# Patient Record
Sex: Male | Born: 1945 | Race: White | Hispanic: No | Marital: Married | Smoking: Former smoker
Health system: Southern US, Community
[De-identification: ages and names within clinical notes are randomized; demographics above are authoritative.]

## PROBLEM LIST (undated history)

## (undated) DIAGNOSIS — E785 Hyperlipidemia, unspecified: Secondary | ICD-10-CM

## (undated) DIAGNOSIS — D649 Anemia, unspecified: Secondary | ICD-10-CM

## (undated) DIAGNOSIS — Z87442 Personal history of urinary calculi: Secondary | ICD-10-CM

## (undated) DIAGNOSIS — Z974 Presence of external hearing-aid: Secondary | ICD-10-CM

## (undated) DIAGNOSIS — J439 Emphysema, unspecified: Secondary | ICD-10-CM

## (undated) DIAGNOSIS — M109 Gout, unspecified: Secondary | ICD-10-CM

## (undated) DIAGNOSIS — L570 Actinic keratosis: Secondary | ICD-10-CM

## (undated) DIAGNOSIS — I10 Essential (primary) hypertension: Secondary | ICD-10-CM

## (undated) DIAGNOSIS — Z973 Presence of spectacles and contact lenses: Secondary | ICD-10-CM

## (undated) DIAGNOSIS — E119 Type 2 diabetes mellitus without complications: Secondary | ICD-10-CM

## (undated) DIAGNOSIS — G4733 Obstructive sleep apnea (adult) (pediatric): Secondary | ICD-10-CM

## (undated) DIAGNOSIS — I890 Lymphedema, not elsewhere classified: Secondary | ICD-10-CM

## (undated) DIAGNOSIS — N433 Hydrocele, unspecified: Secondary | ICD-10-CM

## (undated) DIAGNOSIS — N3941 Urge incontinence: Secondary | ICD-10-CM

## (undated) DIAGNOSIS — S62101A Fracture of unspecified carpal bone, right wrist, initial encounter for closed fracture: Secondary | ICD-10-CM

## (undated) DIAGNOSIS — R011 Cardiac murmur, unspecified: Secondary | ICD-10-CM

## (undated) DIAGNOSIS — E039 Hypothyroidism, unspecified: Secondary | ICD-10-CM

## (undated) DIAGNOSIS — Z9989 Dependence on other enabling machines and devices: Secondary | ICD-10-CM

## (undated) DIAGNOSIS — C801 Malignant (primary) neoplasm, unspecified: Secondary | ICD-10-CM

## (undated) DIAGNOSIS — Z8546 Personal history of malignant neoplasm of prostate: Secondary | ICD-10-CM

## (undated) HISTORY — PX: OTHER SURGICAL HISTORY: SHX169

## (undated) HISTORY — PX: JOINT REPLACEMENT: SHX530

## (undated) HISTORY — PX: TONSILLECTOMY: SUR1361

## (undated) HISTORY — DX: Actinic keratosis: L57.0

---

## 2004-06-05 ENCOUNTER — Ambulatory Visit: Payer: Self-pay

## 2005-09-01 ENCOUNTER — Emergency Department: Payer: Self-pay | Admitting: Emergency Medicine

## 2005-09-02 ENCOUNTER — Other Ambulatory Visit: Payer: Self-pay

## 2005-09-03 ENCOUNTER — Ambulatory Visit: Payer: Self-pay | Admitting: Urology

## 2005-09-25 ENCOUNTER — Ambulatory Visit: Payer: Self-pay | Admitting: Urology

## 2005-10-15 ENCOUNTER — Ambulatory Visit: Payer: Self-pay | Admitting: Gastroenterology

## 2005-11-02 ENCOUNTER — Ambulatory Visit: Payer: Self-pay | Admitting: Radiation Oncology

## 2006-07-26 ENCOUNTER — Ambulatory Visit: Payer: Self-pay

## 2006-08-06 ENCOUNTER — Ambulatory Visit: Payer: Self-pay | Admitting: Unknown Physician Specialty

## 2006-08-06 ENCOUNTER — Other Ambulatory Visit: Payer: Self-pay

## 2006-08-13 ENCOUNTER — Ambulatory Visit: Payer: Self-pay | Admitting: Unknown Physician Specialty

## 2007-10-06 IMAGING — CT CT ABD-PELV W/O CM
1 of 2 series · 16 of 32 positions shown, 20 images · non-contrast
Comparison: none

REASON FOR EXAM: Abdominal pain. Stone protocol. Rm 3
COMMENTS:

[Series 2: stone · axial · 0.93mm/px · z∈[-290,+151]mm · 16 of 159 slices shown, 20 images]
[im 6/159  soft-tissue]
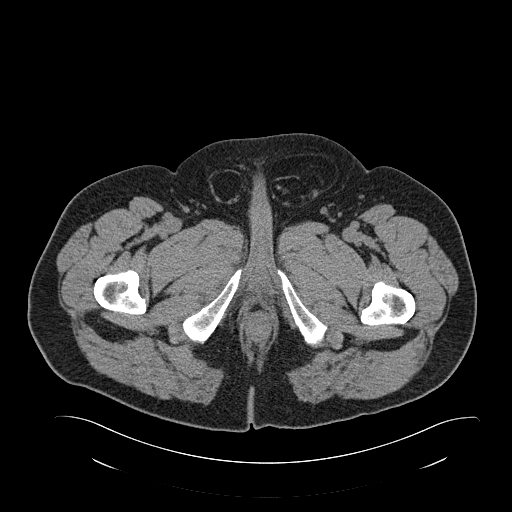
[im 6/159  bone]
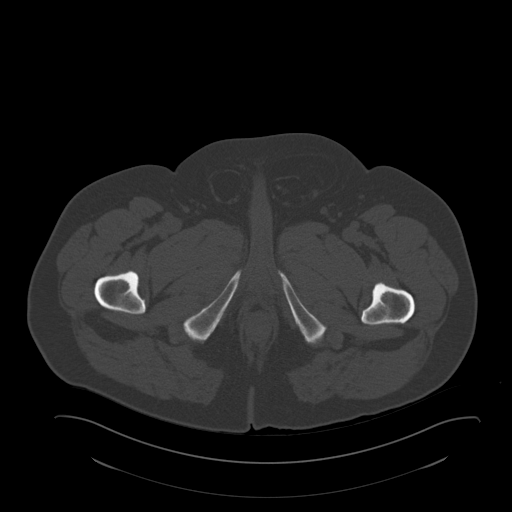
[im 17/159  soft-tissue]
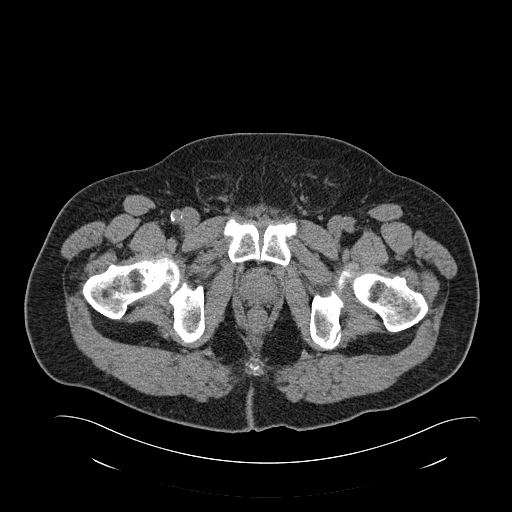
[im 29/159  soft-tissue]
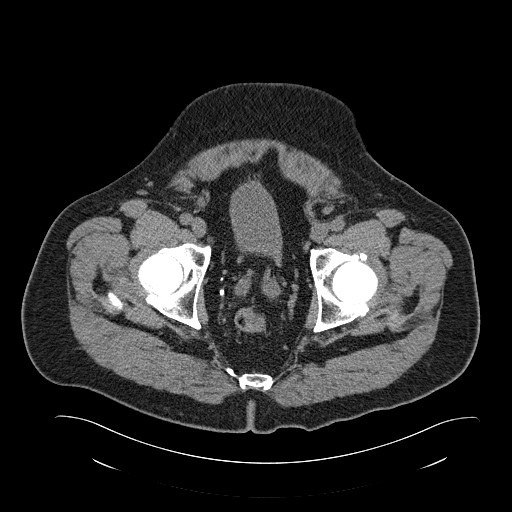
[im 40/159  soft-tissue]
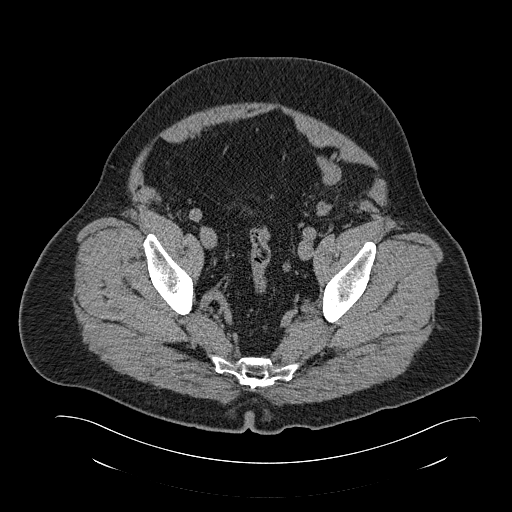
[im 51/159  soft-tissue]
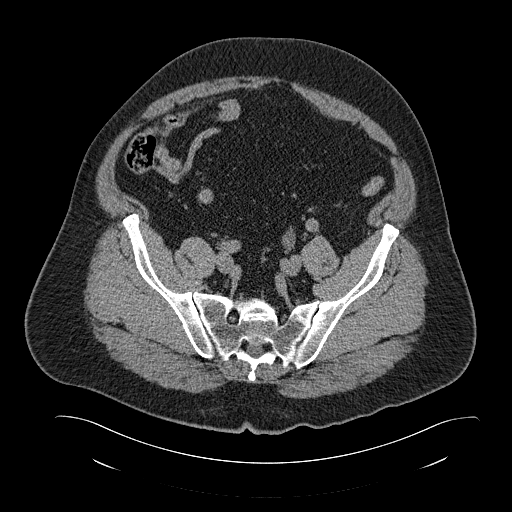
[im 63/159  soft-tissue]
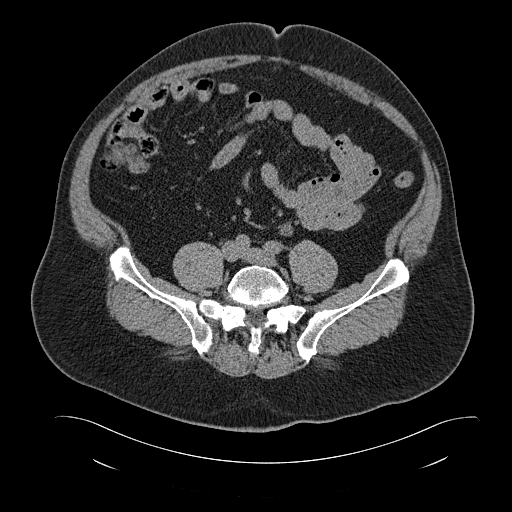
[im 74/159  soft-tissue]
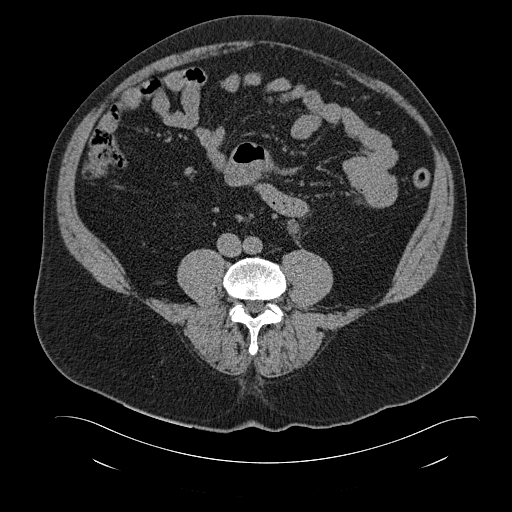
[im 85/159  soft-tissue]
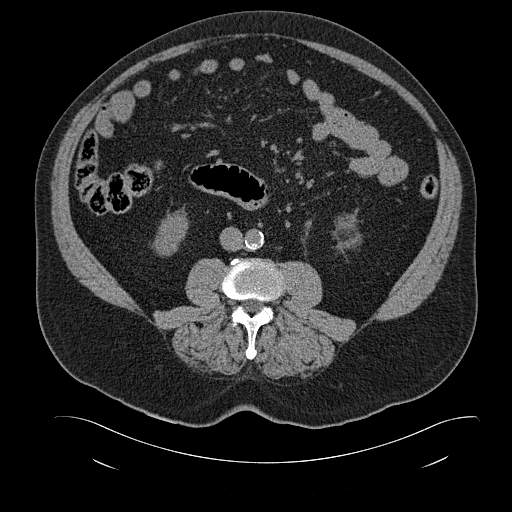
[im 96/159  soft-tissue]
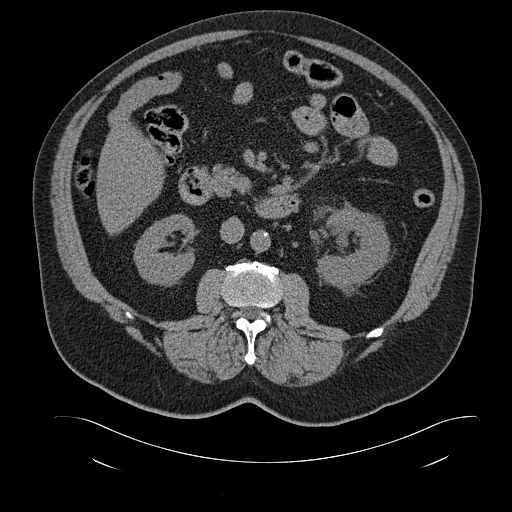
[im 96/159  bone]
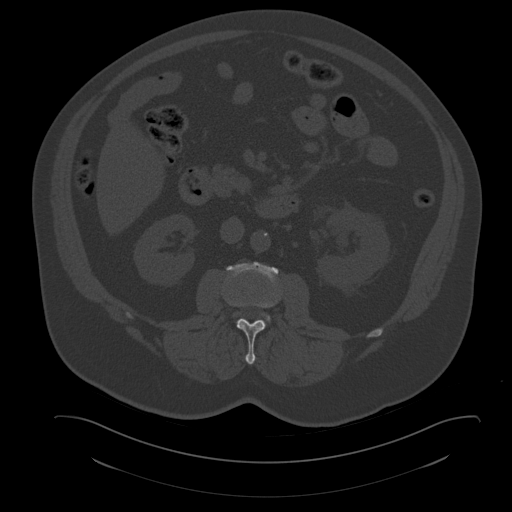
[im 108/159  soft-tissue]
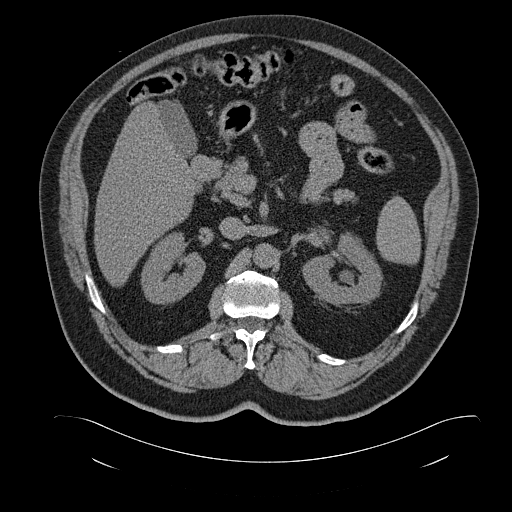
[im 119/159  soft-tissue]
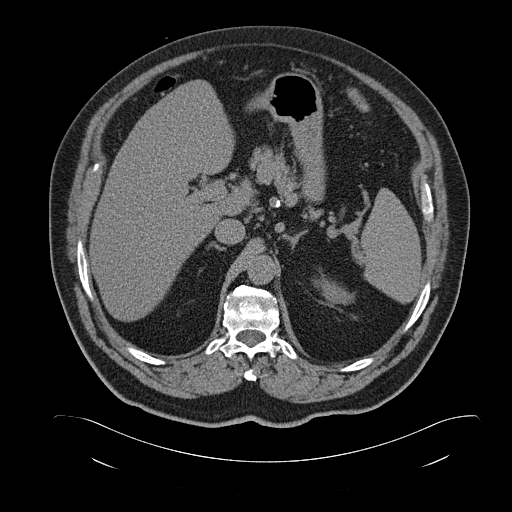
[im 130/159  soft-tissue]
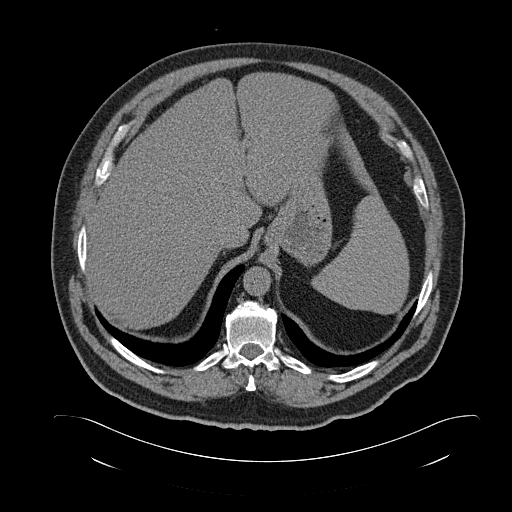
[im 136/159  lung]
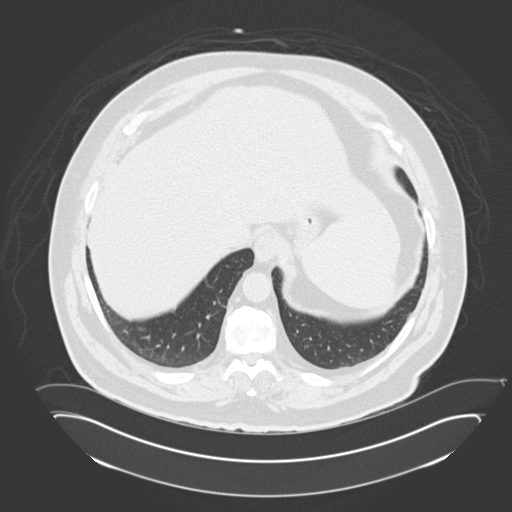
[im 142/159  soft-tissue]
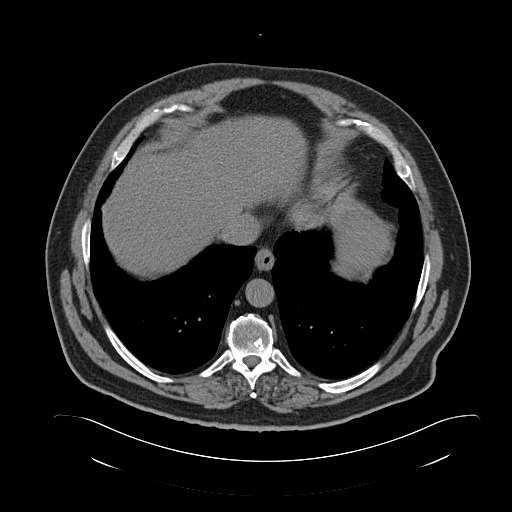
[im 142/159  lung]
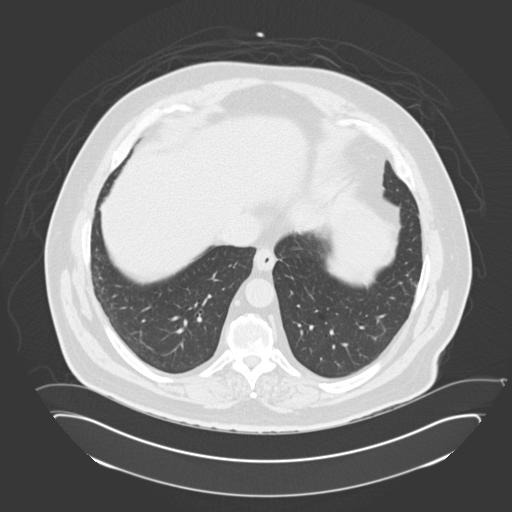
[im 147/159  lung]
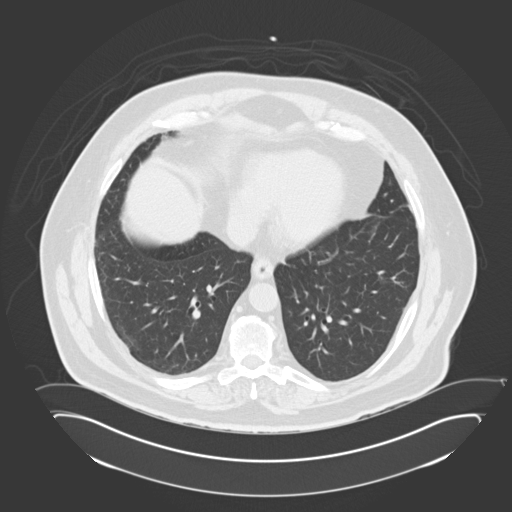
[im 153/159  soft-tissue]
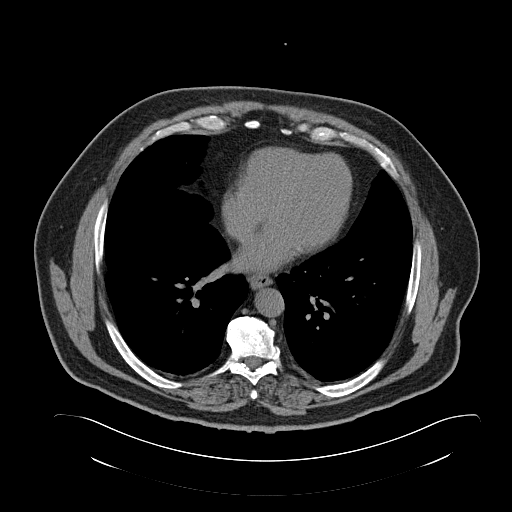
[im 153/159  lung]
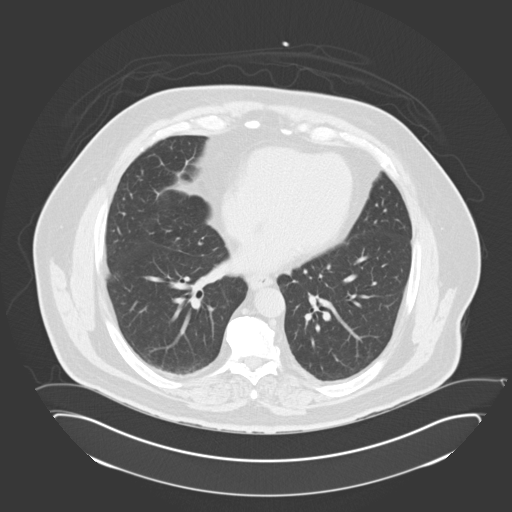

[16 of 32 positions shown; findings below may reference images not displayed]

PROCEDURE:     CT  - CT ABDOMEN AND PELVIS W[DATE]  [DATE]

RESULT:     Noncontrast emergent CT scan of the abdomen and pelvis is
performed.  The patient has no prior study available for comparison.  The
examination demonstrates moderate LEFT-sided hydronephrosis and hydroureter
down to a 4-mm distal LEFT ureteral stone proximal to the ureterovesical
junction (image 126).  No additional stones are seen.  No free fluid or free
air is evident.  There is perinephric stranding on the LEFT.  Some
calcification is noted in the prostate.  Some phleboliths are present in the
pelvic region.  No abnormal bowel distention is present.  The appendix is
normal in appearance.  No radiopaque gallstones are evident.
IMPRESSION: Moderate LEFT-sided hydronephrosis and hydroureter down to a distal LEFT
ureteral stone of approximately 4 mm.

## 2007-10-08 IMAGING — CR DG ABDOMEN 1V
1 series · 2 of 2 positions shown · non-contrast
Comparison: none

REASON FOR EXAM: Renal calculi, lithotripsy
COMMENTS:

[Series 1: view not recorded · 0.17mm/px · 2 of 2 slices shown]
[im 1/2]
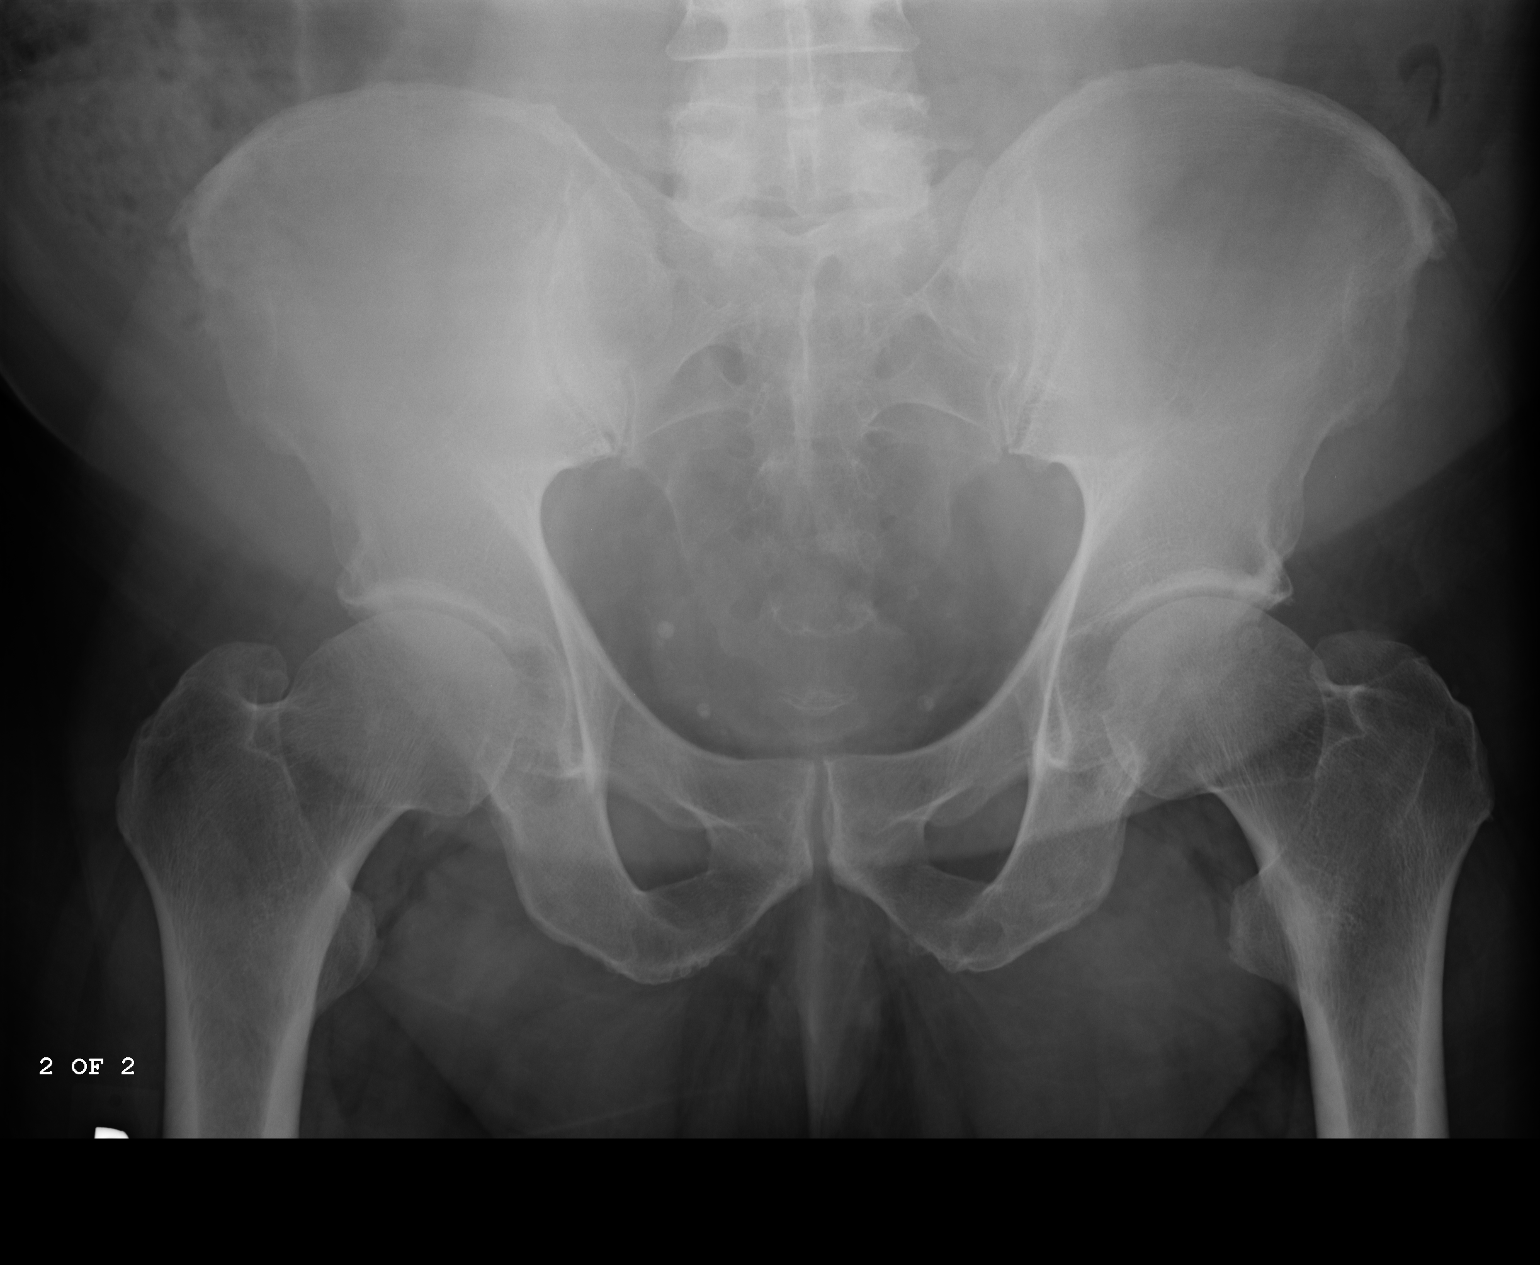
[im 2/2]
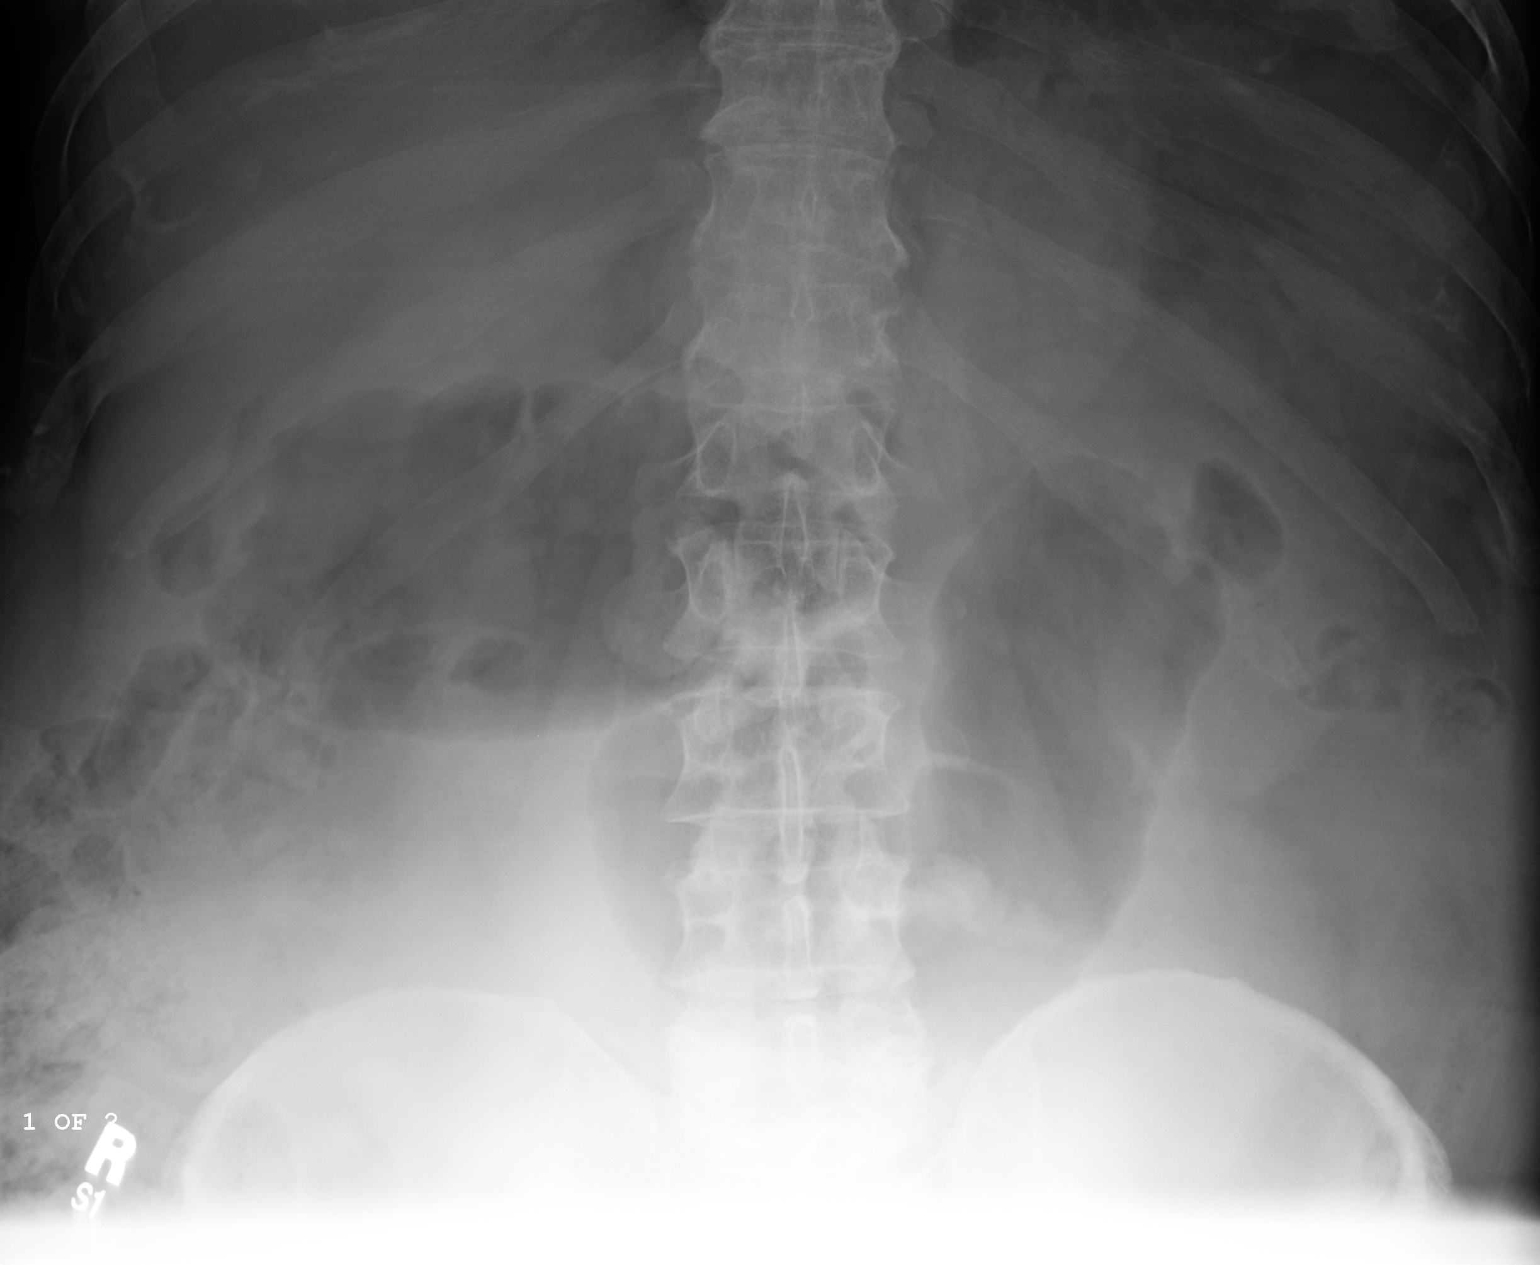

[2 of 2 positions shown; findings below may reference images not displayed]

PROCEDURE:     DXR - DXR KIDNEY URETER BLADDER  - September 03, 2005  [DATE]

RESULT:     Air is seen within nondilated loops of large and small bowel.
There does not appear to be evidence of calcified densities projecting in
the region of the RIGHT and LEFT renal fossa. An amorphous area of density
projects in the region of the transverse colon. This is large and likely
represents a region of stool.

Evaluation of the pelvis demonstrates phleboliths within the pelvis. The
smaller areas particularly in the periphery and the RIGHT lower pelvis may
represent the sequelae of distal ureteral calculi.
IMPRESSION: 1.     Possible distal ureteral calculi on the RIGHT as described above.
2.     Non-obstructive bowel gas pattern.

## 2008-05-11 HISTORY — PX: SHOULDER OPEN ROTATOR CUFF REPAIR: SHX2407

## 2009-06-20 ENCOUNTER — Ambulatory Visit: Payer: Self-pay | Admitting: Gastroenterology

## 2010-05-11 HISTORY — PX: PARTIAL KNEE ARTHROPLASTY: SHX2174

## 2011-05-21 ENCOUNTER — Ambulatory Visit: Payer: Self-pay | Admitting: Unknown Physician Specialty

## 2011-06-04 ENCOUNTER — Ambulatory Visit: Payer: Self-pay | Admitting: Unknown Physician Specialty

## 2012-05-09 ENCOUNTER — Emergency Department: Payer: Self-pay | Admitting: Emergency Medicine

## 2012-05-09 LAB — URINALYSIS, COMPLETE
Bacteria: NONE SEEN
Bilirubin,UR: NEGATIVE
Blood: NEGATIVE
Glucose,UR: >=500 mg/dL
Leukocyte Esterase: NEGATIVE
Nitrite: NEGATIVE
Ph: 5
Protein: NEGATIVE
RBC,UR: 1 /HPF
Specific Gravity: 1.028
Squamous Epithelial: NONE SEEN
WBC UR: 1 /HPF

## 2012-05-09 LAB — COMPREHENSIVE METABOLIC PANEL
Albumin: 3.7 g/dL (ref 3.4–5.0)
Alkaline Phosphatase: 119 U/L (ref 50–136)
BUN: 31 mg/dL — ABNORMAL HIGH (ref 7–18)
Bilirubin,Total: 0.4 mg/dL (ref 0.2–1.0)
Creatinine: 1.13 mg/dL (ref 0.60–1.30)
Glucose: 235 mg/dL — ABNORMAL HIGH (ref 65–99)
Osmolality: 286 (ref 275–301)
Sodium: 136 mmol/L (ref 136–145)
Total Protein: 6.5 g/dL (ref 6.4–8.2)

## 2012-05-09 LAB — CBC
HCT: 47.5 %
HGB: 16.2 g/dL
MCH: 29.7 pg
MCHC: 34.2 g/dL
MCV: 87 fL
Platelet: 156 x10 3/mm 3
RBC: 5.47 x10 6/mm 3
RDW: 14.9 % — ABNORMAL HIGH
WBC: 5.4 x10 3/mm 3

## 2012-05-09 LAB — AMYLASE: Amylase: 30 U/L

## 2012-05-09 LAB — LIPASE, BLOOD: Lipase: 112 U/L

## 2012-05-09 LAB — TROPONIN I: Troponin-I: <0.02 ng/mL

## 2012-08-15 DIAGNOSIS — C61 Malignant neoplasm of prostate: Secondary | ICD-10-CM | POA: Insufficient documentation

## 2013-08-04 ENCOUNTER — Ambulatory Visit: Payer: Self-pay | Admitting: Gastroenterology

## 2013-08-08 LAB — PATHOLOGY REPORT

## 2014-05-31 DIAGNOSIS — G4733 Obstructive sleep apnea (adult) (pediatric): Secondary | ICD-10-CM

## 2014-05-31 DIAGNOSIS — M109 Gout, unspecified: Secondary | ICD-10-CM | POA: Insufficient documentation

## 2014-10-09 DIAGNOSIS — G8929 Other chronic pain: Secondary | ICD-10-CM | POA: Insufficient documentation

## 2015-01-29 ENCOUNTER — Other Ambulatory Visit: Payer: Self-pay | Admitting: Urology

## 2015-02-02 ENCOUNTER — Encounter: Payer: Self-pay | Admitting: Emergency Medicine

## 2015-02-02 ENCOUNTER — Emergency Department
Admission: EM | Admit: 2015-02-02 | Discharge: 2015-02-02 | Disposition: A | Discharge status: Home / Self Care | Payer: Medicare Other | Attending: Emergency Medicine | Admitting: Emergency Medicine

## 2015-02-02 ENCOUNTER — Emergency Department: Payer: Medicare Other

## 2015-02-02 DIAGNOSIS — S62141A Displaced fracture of body of hamate [unciform] bone, right wrist, initial encounter for closed fracture: Secondary | ICD-10-CM | POA: Insufficient documentation

## 2015-02-02 DIAGNOSIS — Z87891 Personal history of nicotine dependence: Secondary | ICD-10-CM | POA: Diagnosis not present

## 2015-02-02 DIAGNOSIS — Y9389 Activity, other specified: Secondary | ICD-10-CM | POA: Insufficient documentation

## 2015-02-02 DIAGNOSIS — W1839XA Other fall on same level, initial encounter: Secondary | ICD-10-CM | POA: Diagnosis not present

## 2015-02-02 DIAGNOSIS — Y998 Other external cause status: Secondary | ICD-10-CM | POA: Diagnosis not present

## 2015-02-02 DIAGNOSIS — S62101A Fracture of unspecified carpal bone, right wrist, initial encounter for closed fracture: Secondary | ICD-10-CM

## 2015-02-02 DIAGNOSIS — Y9289 Other specified places as the place of occurrence of the external cause: Secondary | ICD-10-CM | POA: Insufficient documentation

## 2015-02-02 DIAGNOSIS — Z880 Allergy status to penicillin: Secondary | ICD-10-CM | POA: Insufficient documentation

## 2015-02-02 DIAGNOSIS — S6991XA Unspecified injury of right wrist, hand and finger(s), initial encounter: Secondary | ICD-10-CM | POA: Diagnosis present

## 2015-02-02 DIAGNOSIS — E119 Type 2 diabetes mellitus without complications: Secondary | ICD-10-CM | POA: Insufficient documentation

## 2015-02-02 DIAGNOSIS — I10 Essential (primary) hypertension: Secondary | ICD-10-CM | POA: Insufficient documentation

## 2015-02-02 HISTORY — DX: Essential (primary) hypertension: I10

## 2015-02-02 MED ORDER — HYDROCODONE-ACETAMINOPHEN 5-325 MG PO TABS
ORAL_TABLET | ORAL | Status: DC
Start: 2015-02-02 — End: 2018-10-25

## 2015-02-02 NOTE — ED Notes (Signed)
Patient with no complaints at this time. Respirations even and unlabored. Skin warm/dry. Discharge instructions reviewed with patient at this time. Patient given opportunity to voice concerns/ask questions. Patient discharged at this time and left Emergency Department with steady gait, accompanied by significant other.

## 2015-02-02 NOTE — ED Notes (Signed)
Pt reports falling off the curb at [place] today around 130 hours. He states that he thinks he missed the curb; denies loc/soubts that he hit his head. Pt has bruising on left arm. Pt alert & oriented with warm, dry skin. NAD noted.

## 2015-02-02 NOTE — ED Notes (Signed)
States slipped off curb and fell leaving store, pain both wrists.

## 2015-02-02 NOTE — ED Provider Notes (Signed)
[facility] Emergency Department Provider Note  ____________________________________________  Time seen: Approximately 6:55 PM  I have reviewed the triage vital signs and the nursing notes.   HISTORY  Chief Complaint Wrist Pain   HPI Christopher Pruitt is a 69 y.o. male is here with complaint of right wrist pain. He states that he fell off the curb at [place] today around 1:30 PM. He states that he thinks that he stepped down and missed. He denies any loss of consciousness or head injury. Denies any neck pain. He denies any previous injury to his right wrist. He is not taking any over-the-counter medication for this. He rates his pain is 5/10. Pain is constant and nonradiating. He denies any paresthesias distal to his injury. Range of motion or exertion of his wrist increases his pain.   Past Medical History  Diagnosis Date  . Diabetes mellitus without complication   . Hypertension   . Thyroid disease     There are no active problems to display for this patient.   Past Surgical History  Procedure Laterality Date  . Rotater cuff surgery      Current Outpatient Rx  Name  Route  Sig  Dispense  Refill  . HYDROcodone-acetaminophen (NORCO/VICODIN) 5-325 MG per tablet      Take 1 tablet q 4-6 hours prn pain   30 tablet   0     Allergies Amoxicillin-pot clavulanate; Nalfon; and Robaxin  No family history on file.  Social History Social History  Substance Use Topics  . Smoking status: Former Research scientist (life sciences)  . Smokeless tobacco: None  . Alcohol Use: No    Review of Systems Constitutional: No fever/chills Eyes: No visual changes. Cardiovascular: Denies chest pain. Respiratory: Denies shortness of breath. Gastrointestinal:  No nausea, no vomiting. Musculoskeletal: Negative for back pain. Positive for right wrist pain Skin: Negative for rash. Neurological: Negative for headaches, focal weakness or numbness.  10-point ROS otherwise  negative.  ____________________________________________   PHYSICAL EXAM:  VITAL SIGNS: ED Triage Vitals  Enc Vitals Group     BP 02/02/15 1745 154/71 mmHg     Pulse Rate 02/02/15 1745 99     Resp 02/02/15 1745 18     Temp 02/02/15 1745 98.2 F (36.8 C)     Temp Source 02/02/15 1745 Oral     SpO2 02/02/15 1745 95 %     Weight 02/02/15 1745 245 lb (111.131 kg)     Height 02/02/15 1745 5\' 8"  (1.727 m)     Head Cir --      Peak Flow --      Pain Score 02/02/15 1746 5     Pain Loc --      Pain Edu? --      Excl. in [place]? --     Constitutional: Alert and oriented. Well appearing and in no acute distress. Eyes: Conjunctivae are normal. PERRL. EOMI. Head: Atraumatic. Nose: No congestion/rhinnorhea. Neck: No stridor.  No cervical tenderness on palpation of C-spine Cardiovascular: Normal rate, regular rhythm. Grossly normal heart sounds.  Good peripheral circulation. Respiratory: Normal respiratory effort.  No retractions. Lungs CTAB. Gastrointestinal: Soft and nontender. No distention. Musculoskeletal: Right wrist no gross deformity however there is some soft tissue edema present dorsal aspect. There is moderate tenderness on palpation dorsal aspect middle of the wrist between on an radius. Range of motion is restricted secondary to pain. Digits distally range of motion within normal limits. Left wrist no gross deformity range of motion is within  normal limits nontender to palpation. There is no soft tissue edema noted.  No lower extremity tenderness nor edema.  No joint effusions. Neurologic:  Normal speech and language. No gross focal neurologic deficits are appreciated. No gait instability. Skin:  Skin is warm, dry and intact. No rash noted.There is some bruising on the arm above but no tenderness on palpation.  Psychiatric: Mood and affect are normal. Speech and behavior are normal.  ____________________________________________   LABS (all labs ordered are listed, but only  abnormal results are displayed)  Labs Reviewed - No data to display RADIOLOGY  Right wrist x-ray per radiologist shows acute mild grossly displaced fracture of the hamate . Left wrist x-ray shows mild degenerative changes but no acute bony abnormalities.  I, Johnn Hai, personally viewed and evaluated these images (plain radiographs) as part of my medical decision making.  ____________________________________________   PROCEDURES  Procedure(s) performed: None  Critical Care performed: No  ____________________________________________   INITIAL IMPRESSION / ASSESSMENT AND PLAN / ED COURSE  Pertinent labs & imaging results that were available during my care of the patient were reviewed by me and considered in my medical decision making (see chart for details).  OCL splint was ordered for the patient however patient refused this splint due to it being too constrictive. Patient states that he has "3 banana pudding to make". Wife states that she is going to make them the patient is disgruntled. Velcro cockup splint was agreeable for the patient. He was told that immobilization would help his wrist and control his pain much better. He is also told to keep ice and elevate as needed for swelling. He is given a prescription for Norco as needed for pain. He will follow-up with Dr.Menz. He will call the office on Monday for an appointment time. ____________________________________________   FINAL CLINICAL IMPRESSION(S) / ED DIAGNOSES  Final diagnoses:  Fracture of wrist, right, closed, initial encounter      Johnn Hai, PA-C 02/02/15 1920  Lavonia Drafts, MD 02/02/15 2351

## 2015-02-04 ENCOUNTER — Encounter (HOSPITAL_BASED_OUTPATIENT_CLINIC_OR_DEPARTMENT_OTHER): Payer: Self-pay | Admitting: *Deleted

## 2015-02-04 NOTE — Progress Notes (Signed)
NPO AFTER MN.  ARRIVE AT 0715.  NEEDS ISTAT AND EKG.   WILL SYNTHROID, ALLPURINOL, AND DITROPAN AM DOS W/ SIPS OF WATER.

## 2015-02-10 NOTE — Anesthesia Preprocedure Evaluation (Addendum)
Anesthesia Evaluation  Patient identified by MRN, date of birth, ID band Patient awake    Reviewed: Allergy & Precautions, NPO status , Patient's Chart, lab work & pertinent test results  History of Anesthesia Complications Negative for: history of anesthetic complications  Airway Mallampati: III  TM Distance: >3 FB Neck ROM: Full    Dental no notable dental hx. (+) Dental Advisory Given, Teeth Intact   Pulmonary sleep apnea , COPD,  COPD inhaler, former smoker,  4/16  SPIROMETRY: FVC was 4.27 liters, 126% of predicted FEV1 was 3.21, 120% of predicted FEV1 ratio was 75 FEF 25-75% liters per second was 94% of predicted  FLOW VOLUME LOOP: Normal   Impression Normal   *Compared to Previous Study, numbers are slightly improved.    Pulmonary exam normal breath sounds clear to auscultation       Cardiovascular hypertension, Pt. on medications Normal cardiovascular exam Rhythm:Regular Rate:Normal     Neuro/Psych negative neurological ROS  negative psych ROS   GI/Hepatic negative GI ROS, Neg liver ROS,   Endo/Other  diabetesHypothyroidism obesity  Renal/GU negative Renal ROS  negative genitourinary   Musculoskeletal negative musculoskeletal ROS (+)   Abdominal (+) + obese,   Peds negative pediatric ROS (+)  Hematology negative hematology ROS (+)   Anesthesia Other Findings   Reproductive/Obstetrics negative OB ROS                        Anesthesia Physical Anesthesia Plan  ASA: III  Anesthesia Plan: General   Post-op Pain Management:    Induction: Intravenous  Airway Management Planned: LMA  Additional Equipment:   Intra-op Plan:   Post-operative Plan: Extubation in OR  Informed Consent: I have reviewed the patients History and Physical, chart, labs and discussed the procedure including the risks, benefits and alternatives for the proposed anesthesia with the patient or  authorized representative who has indicated his/her understanding and acceptance.   Dental advisory given  Plan Discussed with: CRNA and Anesthesiologist  Anesthesia Plan Comments:       Anesthesia Quick Evaluation

## 2015-02-11 ENCOUNTER — Encounter (HOSPITAL_BASED_OUTPATIENT_CLINIC_OR_DEPARTMENT_OTHER)
Admission: RE | Disposition: A | Discharge status: Home / Self Care | Payer: Self-pay | Source: Ambulatory Visit | Attending: Urology

## 2015-02-11 ENCOUNTER — Ambulatory Visit (HOSPITAL_BASED_OUTPATIENT_CLINIC_OR_DEPARTMENT_OTHER)
Admission: RE | Admit: 2015-02-11 | Discharge: 2015-02-11 | Disposition: A | Discharge status: Home / Self Care | Payer: Medicare Other | Source: Ambulatory Visit | Attending: Urology | Admitting: Urology

## 2015-02-11 ENCOUNTER — Ambulatory Visit (HOSPITAL_BASED_OUTPATIENT_CLINIC_OR_DEPARTMENT_OTHER): Payer: Medicare Other | Admitting: Anesthesiology

## 2015-02-11 ENCOUNTER — Encounter (HOSPITAL_BASED_OUTPATIENT_CLINIC_OR_DEPARTMENT_OTHER): Payer: Self-pay

## 2015-02-11 DIAGNOSIS — Z923 Personal history of irradiation: Secondary | ICD-10-CM | POA: Diagnosis not present

## 2015-02-11 DIAGNOSIS — E669 Obesity, unspecified: Secondary | ICD-10-CM | POA: Insufficient documentation

## 2015-02-11 DIAGNOSIS — Z79899 Other long term (current) drug therapy: Secondary | ICD-10-CM | POA: Diagnosis not present

## 2015-02-11 DIAGNOSIS — G4730 Sleep apnea, unspecified: Secondary | ICD-10-CM | POA: Diagnosis not present

## 2015-02-11 DIAGNOSIS — J449 Chronic obstructive pulmonary disease, unspecified: Secondary | ICD-10-CM | POA: Diagnosis not present

## 2015-02-11 DIAGNOSIS — Z87891 Personal history of nicotine dependence: Secondary | ICD-10-CM | POA: Insufficient documentation

## 2015-02-11 DIAGNOSIS — Z8546 Personal history of malignant neoplasm of prostate: Secondary | ICD-10-CM | POA: Insufficient documentation

## 2015-02-11 DIAGNOSIS — I10 Essential (primary) hypertension: Secondary | ICD-10-CM | POA: Insufficient documentation

## 2015-02-11 DIAGNOSIS — E119 Type 2 diabetes mellitus without complications: Secondary | ICD-10-CM | POA: Insufficient documentation

## 2015-02-11 DIAGNOSIS — Z6837 Body mass index (BMI) 37.0-37.9, adult: Secondary | ICD-10-CM | POA: Insufficient documentation

## 2015-02-11 DIAGNOSIS — Z96659 Presence of unspecified artificial knee joint: Secondary | ICD-10-CM | POA: Diagnosis not present

## 2015-02-11 DIAGNOSIS — Z7982 Long term (current) use of aspirin: Secondary | ICD-10-CM | POA: Diagnosis not present

## 2015-02-11 DIAGNOSIS — E7800 Pure hypercholesterolemia, unspecified: Secondary | ICD-10-CM | POA: Insufficient documentation

## 2015-02-11 DIAGNOSIS — M109 Gout, unspecified: Secondary | ICD-10-CM | POA: Insufficient documentation

## 2015-02-11 DIAGNOSIS — N433 Hydrocele, unspecified: Secondary | ICD-10-CM | POA: Diagnosis not present

## 2015-02-11 HISTORY — DX: Presence of external hearing-aid: Z97.4

## 2015-02-11 HISTORY — DX: Type 2 diabetes mellitus without complications: E11.9

## 2015-02-11 HISTORY — DX: Personal history of malignant neoplasm of prostate: Z85.46

## 2015-02-11 HISTORY — DX: Lymphedema, not elsewhere classified: I89.0

## 2015-02-11 HISTORY — DX: Urge incontinence: N39.41

## 2015-02-11 HISTORY — PX: HYDROCELE EXCISION: SHX482

## 2015-02-11 HISTORY — DX: Gout, unspecified: M10.9

## 2015-02-11 HISTORY — DX: Fracture of unspecified carpal bone, right wrist, initial encounter for closed fracture: S62.101A

## 2015-02-11 HISTORY — DX: Hypothyroidism, unspecified: E03.9

## 2015-02-11 HISTORY — DX: Presence of spectacles and contact lenses: Z97.3

## 2015-02-11 HISTORY — DX: Hydrocele, unspecified: N43.3

## 2015-02-11 HISTORY — DX: Obstructive sleep apnea (adult) (pediatric): G47.33

## 2015-02-11 HISTORY — DX: Emphysema, unspecified: J43.9

## 2015-02-11 HISTORY — DX: Dependence on other enabling machines and devices: Z99.89

## 2015-02-11 LAB — GLUCOSE, CAPILLARY: Glucose-Capillary: 185 mg/dL — ABNORMAL HIGH (ref 65–99)

## 2015-02-11 LAB — POCT I-STAT 4, (NA,K, GLUC, HGB,HCT)
GLUCOSE: 127 mg/dL — AB (ref 65–99)
HCT: 37 % — ABNORMAL LOW (ref 39.0–52.0)
Hemoglobin: 12.6 g/dL — ABNORMAL LOW (ref 13.0–17.0)
POTASSIUM: 4.4 mmol/L (ref 3.5–5.1)
Sodium: 138 mmol/L (ref 135–145)

## 2015-02-11 SURGERY — HYDROCELECTOMY
Anesthesia: General | Site: Scrotum | Laterality: Left

## 2015-02-11 MED ORDER — MIDAZOLAM HCL 5 MG/5ML IJ SOLN
INTRAMUSCULAR | Status: DC | PRN
  Administered 2015-02-11: 1 mg via INTRAVENOUS

## 2015-02-11 MED ORDER — HYDROCODONE-ACETAMINOPHEN 5-325 MG PO TABS
1.0000 | ORAL_TABLET | ORAL | Status: DC | PRN
  Administered 2015-02-11: 1 via ORAL
  Filled 2015-02-11: qty 2

## 2015-02-11 MED ORDER — FENTANYL CITRATE (PF) 100 MCG/2ML IJ SOLN
INTRAMUSCULAR | Status: DC | PRN
  Administered 2015-02-11: 25 ug via INTRAVENOUS
  Administered 2015-02-11: 50 ug via INTRAVENOUS
  Administered 2015-02-11: 25 ug via INTRAVENOUS

## 2015-02-11 MED ORDER — PROPOFOL 10 MG/ML IV BOLUS
INTRAVENOUS | Status: DC | PRN
  Administered 2015-02-11 (×4): 20 mg via INTRAVENOUS
  Administered 2015-02-11: 200 mg via INTRAVENOUS

## 2015-02-11 MED ORDER — CEFAZOLIN SODIUM 1-5 GM-% IV SOLN
1.0000 g | INTRAVENOUS | Status: DC
  Filled 2015-02-11: qty 50

## 2015-02-11 MED ORDER — CLINDAMYCIN PHOSPHATE 900 MG/50ML IV SOLN
INTRAVENOUS | Status: AC
  Filled 2015-02-11: qty 50

## 2015-02-11 MED ORDER — CEFAZOLIN SODIUM-DEXTROSE 2-3 GM-% IV SOLR
INTRAVENOUS | Status: AC
  Filled 2015-02-11: qty 50

## 2015-02-11 MED ORDER — LIDOCAINE HCL (CARDIAC) 20 MG/ML IV SOLN
INTRAVENOUS | Status: DC | PRN
  Administered 2015-02-11: 80 mg via INTRAVENOUS

## 2015-02-11 MED ORDER — HYDROCODONE-ACETAMINOPHEN 5-325 MG PO TABS
ORAL_TABLET | ORAL | Status: AC
  Filled 2015-02-11: qty 1

## 2015-02-11 MED ORDER — ONDANSETRON HCL 4 MG/2ML IJ SOLN
INTRAMUSCULAR | Status: DC | PRN
  Administered 2015-02-11 (×2): 4 mg via INTRAVENOUS

## 2015-02-11 MED ORDER — CLINDAMYCIN PHOSPHATE 900 MG/50ML IV SOLN
900.0000 mg | Freq: Once | INTRAVENOUS | Status: AC
  Administered 2015-02-11: 900 mg via INTRAVENOUS
  Filled 2015-02-11: qty 50

## 2015-02-11 MED ORDER — PHENYLEPHRINE HCL 10 MG/ML IJ SOLN
INTRAMUSCULAR | Status: DC | PRN
  Administered 2015-02-11 (×5): 120 ug via INTRAVENOUS

## 2015-02-11 MED ORDER — CLINDAMYCIN HCL 300 MG PO CAPS: 600.0000 mg | ORAL_CAPSULE | Freq: Three times a day (TID) | ORAL | Status: DC

## 2015-02-11 MED ORDER — FENTANYL CITRATE (PF) 100 MCG/2ML IJ SOLN
INTRAMUSCULAR | Status: AC
  Filled 2015-02-11: qty 2

## 2015-02-11 MED ORDER — LACTATED RINGERS IV SOLN
INTRAVENOUS | Status: DC
  Administered 2015-02-11 (×2): via INTRAVENOUS
  Filled 2015-02-11: qty 1000

## 2015-02-11 MED ORDER — ONDANSETRON HCL 4 MG/2ML IJ SOLN
4.0000 mg | Freq: Once | INTRAMUSCULAR | Status: DC | PRN
  Filled 2015-02-11: qty 2

## 2015-02-11 MED ORDER — HYDROCODONE-ACETAMINOPHEN 5-325 MG PO TABS: 1.0000 | ORAL_TABLET | ORAL | Status: DC | PRN

## 2015-02-11 MED ORDER — CEFAZOLIN SODIUM-DEXTROSE 2-3 GM-% IV SOLR
2.0000 g | INTRAVENOUS | Status: DC
  Filled 2015-02-11: qty 50

## 2015-02-11 MED ORDER — MIDAZOLAM HCL 2 MG/2ML IJ SOLN
INTRAMUSCULAR | Status: AC
  Filled 2015-02-11: qty 2

## 2015-02-11 MED ORDER — DEXAMETHASONE SODIUM PHOSPHATE 4 MG/ML IJ SOLN
INTRAMUSCULAR | Status: DC | PRN
  Administered 2015-02-11: 10 mg via INTRAVENOUS

## 2015-02-11 MED ORDER — BUPIVACAINE HCL (PF) 0.25 % IJ SOLN
INTRAMUSCULAR | Status: DC | PRN
  Administered 2015-02-11: 7 mL

## 2015-02-11 MED ORDER — FENTANYL CITRATE (PF) 100 MCG/2ML IJ SOLN
25.0000 ug | INTRAMUSCULAR | Status: DC | PRN
  Filled 2015-02-11: qty 1

## 2015-02-11 SURGICAL SUPPLY — 47 items
BLADE CLIPPER SURG (BLADE) IMPLANT
BLADE HEX COATED 2.75 (ELECTRODE) IMPLANT
BLADE SURG 15 STRL LF DISP TIS (BLADE) ×1 IMPLANT
BLADE SURG 15 STRL SS (BLADE) ×1
BNDG GAUZE ELAST 4 BULKY (GAUZE/BANDAGES/DRESSINGS) ×2 IMPLANT
CLEANER CAUTERY TIP 5X5 PAD (MISCELLANEOUS) ×1 IMPLANT
COVER BACK TABLE 60X90IN (DRAPES) ×2 IMPLANT
COVER MAYO STAND STRL (DRAPES) ×2 IMPLANT
DISSECTOR ROUND CHERRY 3/8 STR (MISCELLANEOUS) IMPLANT
DRAIN PENROSE 18X1/2 LTX STRL (DRAIN) ×2 IMPLANT
DRAIN PENROSE 18X1/4 LTX STRL (WOUND CARE) IMPLANT
DRAPE PED LAPAROTOMY (DRAPES) ×2 IMPLANT
DRSG TEGADERM 4X4.75 (GAUZE/BANDAGES/DRESSINGS) IMPLANT
ELECT REM PT RETURN 9FT ADLT (ELECTROSURGICAL) ×2
ELECTRODE REM PT RTRN 9FT ADLT (ELECTROSURGICAL) ×1 IMPLANT
GAUZE SPONGE 4X4 16PLY XRAY LF (GAUZE/BANDAGES/DRESSINGS) ×2 IMPLANT
GLOVE BIO SURGEON STRL SZ7.5 (GLOVE) ×2 IMPLANT
GLOVE BIOGEL M 7.0 STRL (GLOVE) ×2 IMPLANT
GLOVE BIOGEL PI IND STRL 7.5 (GLOVE) ×4 IMPLANT
GLOVE BIOGEL PI INDICATOR 7.5 (GLOVE) ×4
GOWN STRL REUS W/ TWL LRG LVL3 (GOWN DISPOSABLE) ×1 IMPLANT
GOWN STRL REUS W/ TWL XL LVL3 (GOWN DISPOSABLE) ×1 IMPLANT
GOWN STRL REUS W/TWL LRG LVL3 (GOWN DISPOSABLE) ×1
GOWN STRL REUS W/TWL XL LVL3 (GOWN DISPOSABLE) ×1
LIQUID BAND (GAUZE/BANDAGES/DRESSINGS) IMPLANT
MANIFOLD NEPTUNE II (INSTRUMENTS) IMPLANT
NEEDLE HYPO 25X1 1.5 SAFETY (NEEDLE) ×2 IMPLANT
NS IRRIG 500ML POUR BTL (IV SOLUTION) ×2 IMPLANT
PACK BASIN DAY SURGERY FS (CUSTOM PROCEDURE TRAY) ×2 IMPLANT
PAD CLEANER CAUTERY TIP 5X5 (MISCELLANEOUS) ×1
PENCIL BUTTON HOLSTER BLD 10FT (ELECTRODE) ×2 IMPLANT
SUPPORT SCROTAL LG STRP (MISCELLANEOUS) ×2 IMPLANT
SUT CHROMIC 3 0 SH 27 (SUTURE) ×8 IMPLANT
SUT ETHILON 3 0 PS 1 (SUTURE) ×2 IMPLANT
SUT MNCRL AB 4-0 PS2 18 (SUTURE) IMPLANT
SUT SILK 2 0 FS (SUTURE) IMPLANT
SUT VIC AB 2-0 SH 27 (SUTURE)
SUT VIC AB 2-0 SH 27XBRD (SUTURE) IMPLANT
SUT VIC AB 4-0 SH 27 (SUTURE) ×1
SUT VIC AB 4-0 SH 27XANBCTRL (SUTURE) ×1 IMPLANT
SYR BULB IRRIGATION 50ML (SYRINGE) ×2 IMPLANT
SYR CONTROL 10ML LL (SYRINGE) ×2 IMPLANT
TOWEL OR 17X24 6PK STRL BLUE (TOWEL DISPOSABLE) ×4 IMPLANT
TRAY DSU PREP LF (CUSTOM PROCEDURE TRAY) ×2 IMPLANT
TUBE CONNECTING 12X1/4 (SUCTIONS) ×2 IMPLANT
WATER STERILE IRR 500ML POUR (IV SOLUTION) IMPLANT
YANKAUER SUCT BULB TIP NO VENT (SUCTIONS) ×2 IMPLANT

## 2015-02-11 NOTE — H&P (Signed)
Chief Complaint   Left hydrocele    Referring provider: Dr. Emily Filbert   History of Present Illness   The patient is a 69 y.o. male with PMH of prostate cancer s/p cyber knife radiation in 2013 at [facility] and vasectomy presenting with left hydrocele. The patient finds it uncomfortable, and he often urinates on himself as a result. He noticed it at least 3 months ago. He denies pain. He has history of prostate cancer. He is [facility] yearly. His last PSA was undetectable. He also notes occasional incontinence. He is on generic flomax by his PCP.     Past Medical History Problems  1. History of chronic obstructive lung disease (Z87.09) 2. History of diabetes mellitus (Z86.39) 3. History of gout (Z87.39) 4. History of hypercholesterolemia (Z86.39) 5. History of hypertension (Z86.79) 6. History of prostate cancer (Z85.46) 7. History of sleep apnea (Z87.09)  Surgical History Problems  1. History of Knee Replacement 2. History of Shoulder Surgery 3. History of Tonsillectomy  Current Meds 1. Allopurinol 300 MG Oral Tablet;  Therapy: (Recorded:19Sep2016) to Recorded 2. Aspirin 325 MG Oral Tablet;  Therapy: (Recorded:19Sep2016) to Recorded 3. Crestor 10 MG Oral Tablet;  Therapy: (Recorded:19Sep2016) to Recorded 4. Fish Oil CAPS;  Therapy: (Recorded:19Sep2016) to Recorded 5. Glimepiride 2 MG Oral Tablet;  Therapy: (Recorded:19Sep2016) to Recorded 6. Hydrochlorothiazide 25 MG Oral Tablet;  Therapy: (Recorded:19Sep2016) to Recorded 7. Levothyroxine Sodium 125 MCG Oral Tablet;  Therapy: (Recorded:19Sep2016) to Recorded 8. Lisinopril 40 MG Oral Tablet;  Therapy: (Recorded:19Sep2016) to Recorded 9. MetFORMIN HCl - 1000 MG Oral Tablet;  Therapy: (Recorded:19Sep2016) to Recorded  Allergies Medication  1. Amoxicillin TABS 2. Nalfon 3. Robaxin TABS  Family History Problems  1. Family history of malignant neoplasm (Z80.9) : Mother, Sister 2. Family history of malignant  neoplasm of prostate (Z80.42)  Social History Problems    Father deceased   Former smoker (Z59.891)   Married   Mother deceased   No alcohol use   No caffeine use  Review of Systems Genitourinary, constitutional, skin, eye, otolaryngeal, hematologic/lymphatic, cardiovascular, pulmonary, endocrine, musculoskeletal, gastrointestinal, neurological and psychiatric system(s) were reviewed and pertinent findings if present are noted and are otherwise negative.  Genitourinary: urinary frequency, feelings of urinary urgency, nocturia and incontinence.  Gastrointestinal: heartburn and diarrhea.  Integumentary: pruritus.  Hematologic/Lymphatic: a tendency to easily bruise.    Vitals Vital Signs [Data Includes: Last 1 Day]  Recorded: 19Sep2016 09:07AM  Weight: 245 lb  Blood Pressure: 128 / 74 Temperature: 98.2 F Heart Rate: 82  Physical Exam Constitutional: Well nourished and well developed.   ENT:. The ears and nose are normal in appearance.   Neck: The appearance of the neck is normal.   Pulmonary: No respiratory distress.   Cardiovascular:. No peripheral edema.   Abdomen: The abdomen is obese, but not distended. The abdomen is soft and nontender.   Rectal: The prostate exam was deferred.   Genitourinary: Examination of the penis demonstrates a normal meatus. The penis is circumcised. Examination of the left scrotum demostrates a hydrocele. The right testis is palpably normal and non-tender. The left testis is non-tender.   Skin: Normal skin turgor.   Neuro/Psych:. Mood and affect are appropriate.    Assessment Assessed  1. Hydrocele (N43.3)    I discussed the risks, benefits, and alternatives of surgery with the patient. He is aware the risks include bleeding and infection. He would like to proceed with surgery.   Plan Hydrocele  1. Follow-up Schedule Surgery Office  Follow-up  for surgery. office will call to schedule   Status: Complete  Done:  19Sep2016    1. Left hydrocele  -plan for hydrocelectomy in OR  -stop ASA one week prior    2. History of Prostate Cancer  -Currently undetectable PSA per patient  -continue planned follow up at [place] CC: Dr. Emily Filbert   Signatures Electronically signed by : Baruch Gouty, M.D.; Jan 29 2015 10:16AM EST

## 2015-02-11 NOTE — Transfer of Care (Signed)
Immediate Anesthesia Transfer of Care Note  Patient: Christopher Pruitt  Procedure(s) Performed: Procedure(s): LEFT HYDROCELECTOMY ADULT (Left)  Patient Location: PACU  Anesthesia Type:General  Level of Consciousness: awake, alert , oriented and patient cooperative  Airway & Oxygen Therapy: Patient Spontanous Breathing and Patient connected to nasal cannula oxygen  Post-op Assessment: Report given to RN and Post -op Vital signs reviewed and stable  Post vital signs: Reviewed and stable  Last Vitals:  Filed Vitals:   02/11/15 0718  BP: 117/62  Pulse: 82  Temp: 36.4 C  Resp: 16    Complications: No apparent anesthesia complications

## 2015-02-11 NOTE — Anesthesia Postprocedure Evaluation (Signed)
  Anesthesia Post-op Note  Patient: Christopher Pruitt  Procedure(s) Performed: Procedure(s) (LRB): LEFT HYDROCELECTOMY ADULT (Left)  Patient Location: PACU  Anesthesia Type: General  Level of Consciousness: awake and alert   Airway and Oxygen Therapy: Patient Spontanous Breathing  Post-op Pain: mild  Post-op Assessment: Post-op Vital signs reviewed, Patient's Cardiovascular Status Stable, Respiratory Function Stable, Patent Airway and No signs of Nausea or vomiting  Last Vitals:  Filed Vitals:   02/11/15 1000  BP: 119/74  Pulse: 85  Temp: 37.3 C  Resp: 18    Post-op Vital Signs: stable   Complications: No apparent anesthesia complications

## 2015-02-11 NOTE — Op Note (Signed)
Preoperative diagnosis: Left hydrocele   Postoperative diagnosis: Left hydrocele  Procedure: Left hydrocelectomy  Surgeon: Baruch Gouty, M.D.  Drains: Half-inch Penrose.  Specimen: Hydrocele sac  Disposition: Stable to the post anesthesia care unit  Indications for procedure: The patient is a 69 year old gentleman with a large symptomatic left hydrocele. He presents today for definitive surgical management.  Description of procedure: The patient was met in the preoperative area. All risks, benefits, and indications of the surgery were described in great detail. The patient consented to the procedure. Patient was taken back to the operative theater. Preoperative antibiotics were given. Gen. anesthesia was induced per the anesthesia service. The patient was then prepped and draped in usual sterile fashion. A timeout was called. Next 10 cc of quarter percent Marcaine without epinephrine were injected into the midline raphe of the scrotum. Abruption 4 cm incision was then made down the median raphae. Dissection then took place with electrocautery into the left hemiscrotum. The hydrocele sac was encountered and delivered from the left hemiscrotum. All all the connective tissue layers were then manually peeled off the hydrocele sac with electrocautery to quite easily bleeders as needed. Sac was then incised at the anterior portion. 400 cc of fluid was evacuated. Excess hydrocele sac was then excised. Great care is taken not to injure the vas deferens or the spermatic cord. After all excess tissue was removed, the edges of the sac were clinically. The edges were then loosely approximated posterior to the testicle and its sewn together for hemostasis in a jabalay fashion. This was a 3-0 running chromic. After this was done hemostasis was obtained on the scrotum. Hemostasis was excellent after all all red and bleeding areas were coagulated. A quarter-inch Penrose drain was then placed in the inferior aspect  of the scrotum due to the large size of the hydrocele. This is fashion place with a nylon suture. The testicle was delivered back into the scrotum with the lateral sulcus facing laterally. Dartos layer was then reapproximated with running 3-0 chromic. Skin was reapproximated with interrupted 3-0 chromics. 25 was then placed on the incision. A scrotal support placed with fluffs was then placed. The patient was then woken from anesthesia and transferred stable condition to postanesthesia care unit.  next  Plan: The patient will follow up in approximately 1 week for drain removal.

## 2015-02-11 NOTE — Anesthesia Procedure Notes (Signed)
Procedure Name: LMA Insertion Date/Time: 02/11/2015 8:40 AM Performed by: Wanita Chamberlain Pre-anesthesia Checklist: Patient identified, Timeout performed, Emergency Drugs available, Suction available and Patient being monitored Patient Re-evaluated:Patient Re-evaluated prior to inductionOxygen Delivery Method: Circle system utilized Preoxygenation: Pre-oxygenation with 100% oxygen Intubation Type: IV induction Ventilation: Mask ventilation without difficulty LMA: LMA inserted LMA Size: 5.0 Number of attempts: 1 Placement Confirmation: positive ETCO2 and breath sounds checked- equal and bilateral Tube secured with: Tape Dental Injury: Teeth and Oropharynx as per pre-operative assessment

## 2015-02-11 NOTE — Discharge Instructions (Signed)
Do not take aspirin for 3 days     Quitaque: You may apply an ice bag to the scrotum for the first 24 hours.  This may help decrease swelling and soreness.  You may have a dressing held in place by an athletic supporter.  You may remove the dressing in 24 hours and shower in 48 hours.  Continue to use the athletic supporter or tight briefs for at least a week. Activity: Rest today - not necessarily flat bed rest.  Just take it easy.  You should not do strenuous activities until your follow-up visit with your doctor.  You may resume light activity in 48 hours.  Return to Work:  Your doctor will advise you of this depending on the type of work you do  Diet: Drink liquids or eat a light diet this evening.  You may resume a regular diet tomorrow.  General Expectations: You may have a small amount of bleeding.  The scrotum may be swollen or bruised for about a week.  Call your Doctor if these occur:  -persistent or heavy bleeding  -temperature of 101 degrees or more  -severe pain, not relieved by your pain medication     Post Anesthesia Home Care Instructions  Activity: Get plenty of rest for the remainder of the day. A responsible adult should stay with you for 24 hours following the procedure.  For the next 24 hours, DO NOT: -Drive a car -Paediatric nurse -Drink alcoholic beverages -Take any medication unless instructed by your physician -Make any legal decisions or sign important papers.  Meals: Start with liquid foods such as gelatin or soup. Progress to regular foods as tolerated. Avoid greasy, spicy, heavy foods. If nausea and/or vomiting occur, drink only clear liquids until the nausea and/or vomiting subsides. Call your physician if vomiting continues.  Special Instructions/Symptoms: Your throat may feel dry or sore from the anesthesia or the breathing tube placed in your throat during surgery. If this causes  discomfort, gargle with warm salt water. The discomfort should disappear within 24 hours.  If you had a scopolamine patch placed behind your ear for the management of post- operative nausea and/or vomiting:  1. The medication in the patch is effective for 72 hours, after which it should be removed.  Wrap patch in a tissue and discard in the trash. Wash hands thoroughly with soap and water. 2. You may remove the patch earlier than 72 hours if you experience unpleasant side effects which may include dry mouth, dizziness or visual disturbances. 3. Avoid touching the patch. Wash your hands with soap and water after contact with the patch.

## 2015-02-12 ENCOUNTER — Encounter (HOSPITAL_BASED_OUTPATIENT_CLINIC_OR_DEPARTMENT_OTHER): Payer: Self-pay | Admitting: Urology

## 2015-12-19 ENCOUNTER — Encounter: Payer: Self-pay | Admitting: Occupational Therapy

## 2015-12-19 ENCOUNTER — Ambulatory Visit: Payer: Medicare Other | Attending: Internal Medicine | Admitting: Occupational Therapy

## 2015-12-19 DIAGNOSIS — I890 Lymphedema, not elsewhere classified: Secondary | ICD-10-CM

## 2015-12-19 NOTE — Patient Instructions (Signed)

## 2015-12-20 NOTE — Therapy (Signed)
[place] MAIN [facility] SERVICES [address] [place], [place], [address] Phone: [phone]   Fax:  [phone]  Occupational Therapy Evaluation  Patient Details  Name: Christopher Pruitt MRN: [id number] Date of Birth: Aug 09, 1945 Referring Provider: Herma Mering, MD  Encounter Date: 12/19/2015      OT End of Session - 12/19/15 1221    Visit Number 1   Number of Visits 36   Date for OT Re-Evaluation 03/18/16   OT Start Time 0910   OT Stop Time 1010   OT Time Calculation (min) 60 min   Activity Tolerance Patient tolerated treatment well;No increased pain   Behavior During Therapy WFL for tasks assessed/performed      Past Medical History:  Diagnosis Date  . COPD with emphysema ([place])   . Diabetes mellitus, type 2 ([place])   . Gout    STABLE  . History of prostate cancer   . Hypertension   . Hypothyroidism   . Left hydrocele   . Lymphedema of left leg   . OSA on CPAP   . Right wrist fracture    INJURY 02-01-2014 PT FELL  . Urge urinary incontinence   . Wears glasses   . Wears hearing aid    BILATERAL    Past Surgical History:  Procedure Laterality Date  . HYDROCELE EXCISION Left 02/11/2015   Procedure: LEFT HYDROCELECTOMY ADULT;  Surgeon: Nickie Retort, MD;  Location: [facility];  Service: Urology;  Laterality: Left;  . PARTIAL KNEE ARTHROPLASTY Right 2012  . PLACEMENT GOLD STUDS IN PROSTATE  [address]   for [employer] (prostate cancer)  . SHOULDER OPEN ROTATOR CUFF REPAIR Left 2010  . TONSILLECTOMY  as child    There were no vitals filed for this visit.      Subjective Assessment - 12/20/15 1101    Subjective  Pt is referred for OT evaluation and treatment of chronic LLE lymphedema by Herma Mering, MD. Pt reports swelling in left leg started ablout 30 years ago without known precipitating event. Two of his daughters and one grand daughter are also diagnosed w/ leg  lymphedema. Pt denies hx of DVT. He tells me he wore off-the-shelf compression knee highs for years and they helped control swelling, but he has not worn compression garments for many years now. Pt reports he noticed increasing LLE swelling about a month ago and  went back to an OTS garment, but these are uncomfortable.Pt denies pervious lymphedema treatment.   Patient is accompained by: Family member   Pertinent History Hx prostate cancer 2006 w/ cyberknife XRT 08/2022;            [facility] OT Assessment - 12/20/15 0001      Assessment   Diagnosis LLE Lymphedema- most likely primary   Referring Provider Herma Mering, MD   Onset Date 05/21/85   Prior Therapy no CDT; OTS compression knee highs- not medical grade     Precautions   Precautions Other (comment)     Balance Screen   Has the patient fallen in the past 6 months Yes     Home  Environment   Lives With Spouse;Other (Comment)  single s; no railingstory home w/ 2 steps to enter     Prior Function   Vocation Retired   [employer] Retired Investment banker, corporate Takes care of all shopping needs independently   Nurse, adult Does Doctor, hospital  completely   Meal Prep Plans, prepares and serves adequate meals independently   [place] own vehicle   Medication Management Is responsible for taking medication in correct dosages at correct time     Mobility   Mobility Status History of falls     Activity Tolerance   Activity Tolerance Tolerate 30+ min activity without fatigue   Activity Tolerance Comments extended standing and walking exacerbate LLE swelling and pain     Observation/Other Assessments   Observations spongey fibrosis concentrated below L knee and extending to toes.. Stemmer sign positive. Hemociderine stain absent, but varicose veins suspected. Skin temp and Hydration WNL. Denies genital swelling     Coordination   Gross Motor Movements are Fluid and Coordinated  Yes          LYMPHEDEMA/ONCOLOGY QUESTIONNAIRE - 12/20/15 1448      Type   Cancer Type --  Prostate dx 2006     Surgeries   Number Lymph Nodes Removed 0     Treatment   Active Radiation Treatment --  cyberknife 2014     What other symptoms do you have   Are you Having Heaviness or Tightness Yes   Are you having Pain Yes   Are you having pitting edema No   Is it Hard or Difficult finding clothes that fit Yes   Do you have infections No   Is there Decreased scar mobility No   Stemmer Sign Yes   Other Symptoms Milld stage II, suspect  familial     Lymphedema Stage   Stage STAGE 2 SPONTANEOUSLY IRREVERSIBLE     Lymphedema Assessments   Lymphedema Assessments Lower extremities     Right Lower Extremity Lymphedema   Other BLE comparative limb volumetrics TBA at first Rx visit. By visual assessment I estimate L>R by ~10% below knee                OT Treatments/Exercises (OP) - 12/20/15 0001      ADLs   Overall ADLs Independent with instrumental ADLs   LB Dressing unable to reach feet   ADL Comments Dependent for all lymphedema management and self care- has no knowledge or skills     Manual Therapy   Manual Therapy Edema management               OT Education - 12/20/15 1451    Education provided Yes   Education Details Provided Pt/caregiver skilled education and ADL training throughout visit for lymphedema etiology, progression, and treatment including Intensive and Management Phase Complete Decongestive Therapy (CDT)  Discussed lymphedema precautions, cellulitis risk, and all CDT and LE self-care components, including compression wrapping/ garments & devices, lymphatic pumping ther ex, simple self-MLD, and skin care. Provided printed Lymphedema Workbook for reference.   Person(s) Educated Patient   Methods Explanation;Demonstration;Tactile cues;Verbal cues   Comprehension Need further instruction;Tactile cues required;Verbal cues required;Returned  demonstration;Verbalized understanding             OT Long Term Goals - 12/20/15 1453      OT LONG TERM GOAL #1   Title Lymphedema (LE) self-care: Pt independent with lymphedema precautions using printed handout as reference within 2 weeks to limit LE progression.   Baseline dependent   Time 2   Period Weeks   Status New     OT LONG TERM GOAL #2   Title Lymphedema (LE) self-care: Pt able to apply multi layered, gradient compression wraps with moderate caregiver assistance to wrap foot portion using proper  techniques within 2 weeks to achieve optimal limb volume reduction.   Baseline dependent   Time 2   Period Weeks   Status New     OT LONG TERM GOAL #3   Title Lymphedema (LE) self-care:  Pt to achieve at least 10% LLE limb volume reductions during Intensive CDT to limit LE progression, decrease infection risk, to reduce pain/ discomfort, and to improve safe ambulation and functional mobility.   Baseline dependent   Time 12   Period Weeks   Status New     OT LONG TERM GOAL #4   Title Lymphedema (LE) self-care:  Pt to tolerate daily compression wraps, garments and devices in keeping w/ prescribed wear regime within 1 week of issue date to progress and retain clinical and functional gains and to limit LE progression.   Baseline dependent   Time 12   Period Weeks   Status New     OT LONG TERM GOAL #5   Title During Management Phase CDT Pt to sustain current limb volumes within 5%, and all other clinical gains achieved during OT treatment with needed level of caregiver assistance to limit LE progression and further functional decline.   Baseline dependent   Time 6   Period Months   Status New               Plan - 12/20/15 1518    Clinical Impression Statement Pt presents with mild, stage II, Left lower extremity lymphedema (LE) with onset reported as "about 30 years ago". Family history suggests familial etiology. Pt also presents with complicating medical issues  known to contribute to lymphedema, including hx of prostate cancer treatment, obstructive sleep apnea and joint replacement. LE treatment precautions requiring careful monitoring in this case include COPD and DM (Type II) and hypothyroidism.   Rehab Potential Good   Clinical Impairments Affecting Rehab Potential DM skin precautions, COPD, pain, LLE swelling, obesity, OSA   OT Frequency 3x / week   OT Duration 12 weeks   OT Treatment/Interventions Self-care/ADL training;Therapeutic exercise;Patient/family education;Manual Therapy;Manual lymph drainage;Therapeutic exercises;DME and/or AE instruction;Compression bandaging;Therapeutic activities   Plan CDT to include MLD, skin care, compression wrapping, and lymphatic pumping ther ex. Pt to be fit w/ appropriate LLE knee lengthgarment that is comfortable and easy to don/ doff w/ assistive devices PRN   Consulted and Agree with Plan of Care Patient      Patient will benefit from skilled therapeutic intervention in order to improve the following deficits and impairments:  Decreased skin integrity, Decreased knowledge of precautions, Decreased knowledge of use of DME, Impaired flexibility, Decreased mobility, Difficulty walking, Obesity, Increased edema, Decreased range of motion, Impaired sensation, Decreased endurance  Visit Diagnosis: Lymphedema, not elsewhere classified - Plan: Ot plan of care cert/re-cert      G-Codes - [id number] 1220-08-13    Functional Assessment Tool Used observation, physical exam, medical Hx review, interview, comparative limb volumetrics   Functional Limitation Self care   Self Care Current Status [id number]) At least 80 percent but less than 100 percent impaired, limited or restricted   Self Care Goal Status [id number]) At least 1 percent but less than 20 percent impaired, limited or restricted      Problem List There are no active problems to display for this patient.   Andrey Spearman, MS, OTR/L, [facility] 12/20/15 3:25  PM   [place] MAIN [facility] SERVICES [address] [place], [place], [address] Phone: [phone]   Fax:  [phone]  Name:  Christopher Pruitt MRN: [id number] Date of Birth: 1945-11-27

## 2015-12-24 ENCOUNTER — Ambulatory Visit: Payer: Medicare Other | Admitting: Occupational Therapy

## 2015-12-24 DIAGNOSIS — I890 Lymphedema, not elsewhere classified: Secondary | ICD-10-CM

## 2015-12-24 NOTE — Patient Instructions (Signed)

## 2015-12-24 NOTE — Therapy (Signed)
[place] MAIN [facility] SERVICES [address] [place], [place], [address] Phone: [phone]   Fax:  [phone]  Occupational Therapy Treatment  Patient Details  Name: Christopher Pruitt MRN: [id number] Date of Birth: 1946-04-22 Referring Provider: Herma Mering, MD  Encounter Date: 12/24/2015      OT End of Session - 12/24/15 1111    Visit Number 2   Number of Visits 36   Date for OT Re-Evaluation 03/18/16   OT Start Time 0915   OT Stop Time 1035   OT Time Calculation (min) 80 min   Activity Tolerance Patient tolerated treatment well;No increased pain   Behavior During Therapy WFL for tasks assessed/performed      Past Medical History:  Diagnosis Date  . COPD with emphysema ([place])   . Diabetes mellitus, type 2 ([place])   . Gout    STABLE  . History of prostate cancer   . Hypertension   . Hypothyroidism   . Left hydrocele   . Lymphedema of left leg   . OSA on CPAP   . Right wrist fracture    INJURY 02-01-2014 PT FELL  . Urge urinary incontinence   . Wears glasses   . Wears hearing aid    BILATERAL    Past Surgical History:  Procedure Laterality Date  . HYDROCELE EXCISION Left 02/11/2015   Procedure: LEFT HYDROCELECTOMY ADULT;  Surgeon: Nickie Retort, MD;  Location: [facility];  Service: Urology;  Laterality: Left;  . PARTIAL KNEE ARTHROPLASTY Right 2012  . PLACEMENT GOLD STUDS IN PROSTATE  [address]   for [employer] (prostate cancer)  . SHOULDER OPEN ROTATOR CUFF REPAIR Left 2010  . TONSILLECTOMY  as child    There were no vitals filed for this visit.      Subjective Assessment - 12/24/15 1012    Subjective  [name] presents today for OT treatment visit 2 . Emphasis of visit is on calculating initial BLE cpomparative limb volumetrics, and on skilled teaching for lymphedema self care.   Patient is accompained by: Family member   Pertinent History Hx prostate cancer  2006 w/ cyberknife XRT 08/2022;    Patient Stated Goals decrease leg swelling and keep it from getting worse   Currently in Pain? Yes  BLE pain/ discomfort not rated numerically today. Unchanged from initial evaluation.   Pain Location Leg   Pain Orientation Right;Left   Pain Onset More than a month ago   Effect of Pain on Daily Activities limits standing and walking tolerance             LYMPHEDEMA/ONCOLOGY QUESTIONNAIRE - 12/24/15 1106      Right Lower Extremity Lymphedema   Other RLE A-D (ankle to below knee) limb volume =1892.04 ml   Other RLE A-G ( ankle to groin) limb volume = 6384.06 ml.   Other Limb volume differential (LVD) = 19.94%, R>L for A-D, and 7.46%, R>L for A-G     Left Lower Extremity Lymphedema   Other LLE A-D (ankle to below knee) limb volume =1577.52 ml   Other LLE  A-G ( ankle to groin) limb volume = 5941.12 ml                 OT Treatments/Exercises (OP) - 12/24/15 0001      ADLs   ADL Education Given Yes     Manual Therapy   Manual Therapy Edema management;Compression Bandaging   Edema  Management Completed initial BLE comparative limb volumetrics   Compression Bandaging LLE gradient compression wraps applied from base of toes to below knee (NO TOE WRAPS today) under cotton stockinett. 8 cm x 5 m x 1 to foot and ankle, 10 cm  x 5 m x 1. ( NO 12 cm x5 cm wrap today). All wraps  applied circumferentially in custommary layered gradient configuration  over .04 x 10 cm x 5 m Rosidol Soft x 1.5 rolls.  Wraps light on foot so patient is able to wear athletic shoes necessary for daily workout routine.                OT Education - 12/24/15 1059    Education provided Yes   Education Details Emphasis of LE ADL training today Skilled teaching for LE self care and understanding results of comparative limb volumetrics in reguard to goals. Patient edu provided for short stretch vs long stretch wrap technology, and compression wraps application using  circumferential, gradient techniques and proper positioning.    Person(s) Educated Patient   Methods Explanation;Demonstration   Comprehension Verbalized understanding;Need further instruction             OT Long Term Goals - 12/20/15 1453      OT LONG TERM GOAL #1   Title Lymphedema (LE) self-care: Pt independent with lymphedema precautions using printed handout as reference within 2 weeks to limit LE progression.   Baseline dependent   Time 2   Period Weeks   Status New     OT LONG TERM GOAL #2   Title Lymphedema (LE) self-care: Pt able to apply multi layered, gradient compression wraps with moderate caregiver assistance to wrap foot portion using proper techniques within 2 weeks to achieve optimal limb volume reduction.   Baseline dependent   Time 2   Period Weeks   Status New     OT LONG TERM GOAL #3   Title Lymphedema (LE) self-care:  Pt to achieve at least 10% LLE limb volume reductions during Intensive CDT to limit LE progression, decrease infection risk, to reduce pain/ discomfort, and to improve safe ambulation and functional mobility.   Baseline dependent   Time 12   Period Weeks   Status New     OT LONG TERM GOAL #4   Title Lymphedema (LE) self-care:  Pt to tolerate daily compression wraps, garments and devices in keeping w/ prescribed wear regime within 1 week of issue date to progress and retain clinical and functional gains and to limit LE progression.   Baseline dependent   Time 12   Period Weeks   Status New     OT LONG TERM GOAL #5   Title During Management Phase CDT Pt to sustain current limb volumes within 5%, and all other clinical gains achieved during OT treatment with needed level of caregiver assistance to limit LE progression and further functional decline.   Baseline dependent   Time 6   Period Months   Status New               Plan - 12/24/15 1112    Clinical Impression Statement Initial BLE comparative limb volumetrics reveal  moderate LVD below the knee w/ RLE 19.94% > than LLE. A-G LVD is less notable at 7.46%, R>L . AccordingR distal thiggh to volumetrics limb volume extends to R distal thigh with feet being similar in volume.  Pt verbalized understanding of introductory level g.radient compression wrapping techniques today. Multiple handouts given  Rehab Potential Good   Clinical Impairments Affecting Rehab Potential DM skin precautions, COPD, pain, LLE swelling, obesity, OSA   OT Frequency 3x / week   OT Duration 12 weeks   OT Treatment/Interventions Self-care/ADL training;Therapeutic exercise;Patient/family education;Manual Therapy;Manual lymph drainage;Therapeutic exercises;DME and/or AE instruction;Compression bandaging;Therapeutic activities   Consulted and Agree with Plan of Care Patient      Patient will benefit from skilled therapeutic intervention in order to improve the following deficits and impairments:  Decreased skin integrity, Decreased knowledge of precautions, Decreased knowledge of use of DME, Impaired flexibility, Decreased mobility, Difficulty walking, Obesity, Increased edema, Decreased range of motion, Impaired sensation, Decreased endurance  Visit Diagnosis: Lymphedema, not elsewhere classified    Problem List There are no active problems to display for this patient.   Andrey Spearman, MS, OTR/L, [facility] 12/24/15 11:20 AM   [place] MAIN [facility] SERVICES [address] [place], [place], [address] Phone: [phone]   Fax:  [phone]  Name: Christopher Pruitt MRN: [id number] Date of Birth: 10-29-1945

## 2015-12-27 ENCOUNTER — Ambulatory Visit: Payer: Medicare Other | Admitting: Occupational Therapy

## 2015-12-27 DIAGNOSIS — I890 Lymphedema, not elsewhere classified: Secondary | ICD-10-CM | POA: Diagnosis not present

## 2015-12-27 NOTE — Therapy (Signed)
[place] MAIN [facility] SERVICES [address] [place], [place], [address] Phone: [phone]   Fax:  [phone]  Occupational Therapy Treatment  Patient Details  Name: BRENDA MILUM MRN: [id number] Date of Birth: [birth year] Referring Provider: Herma Mering, MD  Encounter Date: 12/27/2015      OT End of Session - 12/27/15 1451    Visit Number 3   Number of Visits 36   Date for OT Re-Evaluation 03/18/16   OT Start Time [id number]   OT Stop Time 1220   OT Time Calculation (min) 75 min   Activity Tolerance Patient tolerated treatment well;No increased pain   Behavior During Therapy WFL for tasks assessed/performed      Past Medical History:  Diagnosis Date  . COPD with emphysema ([place])   . Diabetes mellitus, type 2 ([place])   . Gout    STABLE  . History of prostate cancer   . Hypertension   . Hypothyroidism   . Left hydrocele   . Lymphedema of left leg   . OSA on CPAP   . Right wrist fracture    INJURY 02-01-2014 PT FELL  . Urge urinary incontinence   . Wears glasses   . Wears hearing aid    BILATERAL    Past Surgical History:  Procedure Laterality Date  . HYDROCELE EXCISION Left 02/11/2015   Procedure: LEFT HYDROCELECTOMY ADULT;  Surgeon: Nickie Retort, MD;  Location: [facility];  Service: Urology;  Laterality: Left;  . PARTIAL KNEE ARTHROPLASTY Right 2012  . PLACEMENT GOLD STUDS IN PROSTATE  [address]   for [employer] (prostate cancer)  . SHOULDER OPEN ROTATOR CUFF REPAIR Left 2010  . TONSILLECTOMY  as child    There were no vitals filed for this visit.      Subjective Assessment - 12/27/15 1445    Subjective  [name]. Krahenbuhl presents today for OT treatment visit 3, Pt is accompanied by his wife, June, who is here to learn to assist him w/ applying compression wraps during visit intervals. "When I got up this morning there was barely any swelling."   Patient is accompained by:  Family member   Pertinent History Hx prostate cancer 2006 w/ cyberknife XRT 08/2022;    Patient Stated Goals decrease leg swelling and keep it from getting worse   Currently in Pain? No/denies   Pain Onset More than a month ago                      OT Treatments/Exercises (OP) - 12/27/15 0001      ADLs   ADL Education Given Yes     Manual Therapy   Manual Therapy Edema management;Compression Bandaging;Manual Lymphatic Drainage (MLD);Other (comment)  skin care with low ph castor oil during MLD   Compression Bandaging LLE gradient compression wraps applied from base of toes to below knee (NO TOE WRAPS today) under cotton stockinett. 8 cm x 5 m x 1 to foot and ankle, 10 cm  x 5 m x 1. ( NO 12 cm x5 cm wrap today). All wraps  applied circumferentially in custommary layered gradient configuration  over .04 x 10 cm x 5 m Rosidol Soft x 1.5 rolls.  Wraps light on foot so patient is able to wear athletic shoes necessary for daily workout routine.   Other Manual Therapy LLE Manual lymph drainage to (MLD) in supine and side lying utilizing ipsilateral inguinal-axillary anastamosis,  deep abdominal lymphatics via diaphragmatic breathing  and functional groin LNs as is customary to mobilize cancer related lower extremity LE. Sequence included bilateral "short neck" sequence, J strokes to sub and supraclavicular LN, deep abdominal pathways via effective breathing, (no deep strokes to colon or abdomen 2/2 chronic diarrhea);  functional inguinal LN, L lower extremity- proximal to distal -w/ emphasis on thigh and medial knee bottleneck. Performed light fibrosis technique to [facility] and distal  legs to address fatty fibrosis. Good tolerance.                OT Education - 12/27/15 1450    Education provided Yes   Education Details Emphasis of LE ADL training today on Pt and spouse edu for propper gradient pressure wrap application, and on introduction to simple self MLD using  inguinal-axillary anastamosis             OT Long Term Goals - 12/20/15 1453      OT LONG TERM GOAL #1   Title Lymphedema (LE) self-care: Pt independent with lymphedema precautions using printed handout as reference within 2 weeks to limit LE progression.   Baseline dependent   Time 2   Period Weeks   Status New     OT LONG TERM GOAL #2   Title Lymphedema (LE) self-care: Pt able to apply multi layered, gradient compression wraps with moderate caregiver assistance to wrap foot portion using proper techniques within 2 weeks to achieve optimal limb volume reduction.   Baseline dependent   Time 2   Period Weeks   Status New     OT LONG TERM GOAL #3   Title Lymphedema (LE) self-care:  Pt to achieve at least 10% LLE limb volume reductions during Intensive CDT to limit LE progression, decrease infection risk, to reduce pain/ discomfort, and to improve safe ambulation and functional mobility.   Baseline dependent   Time 12   Period Weeks   Status New     OT LONG TERM GOAL #4   Title Lymphedema (LE) self-care:  Pt to tolerate daily compression wraps, garments and devices in keeping w/ prescribed wear regime within 1 week of issue date to progress and retain clinical and functional gains and to limit LE progression.   Baseline dependent   Time 12   Period Weeks   Status New     OT LONG TERM GOAL #5   Title During Management Phase CDT Pt to sustain current limb volumes within 5%, and all other clinical gains achieved during OT treatment with needed level of caregiver assistance to limit LE progression and further functional decline.   Baseline dependent   Time 6   Period Months   Status New               Plan - 12/27/15 1452    Clinical Impression Statement Limb volume reduction in Rx leg, RLE below the knee, is not visible or palpable to this OT after initial visit and interval, although Pt reports decreased sensations of heaviness, fullness, tightness and swelling.  Spongy fibrosis slightly more palpable to day. Pt tolerated compression wraps well for > 24  hours with minir irritation and itching. By end of session after skilled teaching, spouse able to apply knee length wraps using proper techniques with min A. I believe she will be able to apply these correctly during visit intervals after brief practice.     Rehab Potential Good   Clinical Impairments Affecting Rehab Potential DM skin precautions, COPD, pain, LLE  swelling, obesity, OSA   OT Frequency 3x / week   OT Duration 12 weeks   OT Treatment/Interventions Self-care/ADL training;Therapeutic exercise;Patient/family education;Manual Therapy;Manual lymph drainage;Therapeutic exercises;DME and/or AE instruction;Compression bandaging;Therapeutic activities   Consulted and Agree with Plan of Care Patient      Patient will benefit from skilled therapeutic intervention in order to improve the following deficits and impairments:  Decreased skin integrity, Decreased knowledge of precautions, Decreased knowledge of use of DME, Impaired flexibility, Decreased mobility, Difficulty walking, Obesity, Increased edema, Decreased range of motion, Impaired sensation, Decreased endurance  Visit Diagnosis: Lymphedema, not elsewhere classified    Problem List There are no active problems to display for this patient.   Andrey Spearman, MS, OTR/L, [facility] 12/27/15 2:57 PM   [place] MAIN [facility] SERVICES [address] [place], [place], [address] Phone: [phone]   Fax:  [phone]  Name: AESON SGRO MRN: [id number] Date of Birth: 16-Nov-1945

## 2015-12-27 NOTE — Patient Instructions (Signed)
LE instructions and precautions as established- see initial eval.   

## 2015-12-30 ENCOUNTER — Ambulatory Visit: Payer: Medicare Other | Admitting: Occupational Therapy

## 2015-12-30 DIAGNOSIS — I890 Lymphedema, not elsewhere classified: Secondary | ICD-10-CM | POA: Diagnosis not present

## 2015-12-30 NOTE — Therapy (Signed)
[place] MAIN [facility] SERVICES [address] [place], [place], [address] Phone: [phone]   Fax:  [phone]  Occupational Therapy Treatment  Patient Details  Name: Christopher Pruitt MRN: [id number] Date of Birth: [birth year] Referring Provider: Herma Mering, MD  Encounter Date: 12/30/2015      OT End of Session - 12/30/15 1638    Visit Number 4   Number of Visits 36   Date for OT Re-Evaluation 03/18/16   OT Start Time 0208   OT Stop Time 0304   OT Time Calculation (min) 56 min   Activity Tolerance Patient tolerated treatment well;No increased pain   Behavior During Therapy WFL for tasks assessed/performed      Past Medical History:  Diagnosis Date  . COPD with emphysema ([place])   . Diabetes mellitus, type 2 ([place])   . Gout    STABLE  . History of prostate cancer   . Hypertension   . Hypothyroidism   . Left hydrocele   . Lymphedema of left leg   . OSA on CPAP   . Right wrist fracture    INJURY 02-01-2014 PT FELL  . Urge urinary incontinence   . Wears glasses   . Wears hearing aid    BILATERAL    Past Surgical History:  Procedure Laterality Date  . HYDROCELE EXCISION Left 02/11/2015   Procedure: LEFT HYDROCELECTOMY ADULT;  Surgeon: Nickie Retort, MD;  Location: [facility];  Service: Urology;  Laterality: Left;  . PARTIAL KNEE ARTHROPLASTY Right 2012  . PLACEMENT GOLD STUDS IN PROSTATE  [address]   for [employer] (prostate cancer)  . SHOULDER OPEN ROTATOR CUFF REPAIR Left 2010  . TONSILLECTOMY  as child    There were no vitals filed for this visit.      Subjective Assessment - 12/30/15 1638    Subjective  Christopher Pruitt presents today for OT treatment visit 4. Pt reports that he and his wife worked on wrapping over the weekend and she did pretty well. Pt reports more frequent urination. Pt denies pain in lower extremities today.   Patient is accompained by: Family member    Pertinent History Hx prostate cancer 2006 w/ cyberknife XRT 08/2022;    Patient Stated Goals decrease leg swelling and keep it from getting worse   Pain Onset More than a month ago                      OT Treatments/Exercises (OP) - 12/30/15 0001      ADLs   ADL Education Given Yes     Manual Therapy   Manual Therapy Edema management;Compression Bandaging;Manual Lymphatic Drainage (MLD);Other (comment)   Compression Bandaging LLE gradient compression wraps applied from base of toes to below knee (NO TOE WRAPS today) under cotton stockinett. 8 cm x 5 m x 1 to foot and ankle, 10 cm  x 5 m x 1. ( NO 12 cm x5 cm wrap today). All wraps  applied circumferentially in custommary layered gradient configuration  over .04 x 10 cm x 5 m Rosidol Soft x 1.5 rolls.  Wraps light on foot so patient is able to wear athletic shoes necessary for daily workout routine.   Other Manual Therapy LLE Manual lymph drainage to (MLD) in supine and side lying utilizing ipsilateral inguinal-axillary anastamosis, deep abdominal lymphatics via diaphragmatic breathing  and functional groin LNs as is customary to mobilize cancer related lower  extremity LE. Sequence included bilateral "short neck" sequence, J strokes to sub and supraclavicular LN, deep abdominal pathways via effective breathing, (no deep strokes to colon or abdomen 2/2 chronic diarrhea);  functional inguinal LN, L lower extremity- proximal to distal -w/ emphasis on thigh and medial knee bottleneck. Performed light fibrosis technique to [facility] and distal  legs to address fatty fibrosis. Good tolerance.                OT Education - 12/30/15 1421    Education provided Yes   Education Details Continued skilled Pt/caregiver Education  And LE ADL training throughout visit for lymphedema self care, including compression wrapping, compression garment and device wear/care, lymphatic pumping ther ex, simple self-MLD, and skin care. Discussed  progress towards goals.   Person(s) Educated Patient   Methods Explanation;Demonstration   Comprehension Verbalized understanding             OT Long Term Goals - 12/20/15 1453      OT LONG TERM GOAL #1   Title Lymphedema (LE) self-care: Pt independent with lymphedema precautions using printed handout as reference within 2 weeks to limit LE progression.   Baseline dependent   Time 2   Period Weeks   Status New     OT LONG TERM GOAL #2   Title Lymphedema (LE) self-care: Pt able to apply multi layered, gradient compression wraps with moderate caregiver assistance to wrap foot portion using proper techniques within 2 weeks to achieve optimal limb volume reduction.   Baseline dependent   Time 2   Period Weeks   Status New     OT LONG TERM GOAL #3   Title Lymphedema (LE) self-care:  Pt to achieve at least 10% LLE limb volume reductions during Intensive CDT to limit LE progression, decrease infection risk, to reduce pain/ discomfort, and to improve safe ambulation and functional mobility.   Baseline dependent   Time 12   Period Weeks   Status New     OT LONG TERM GOAL #4   Title Lymphedema (LE) self-care:  Pt to tolerate daily compression wraps, garments and devices in keeping w/ prescribed wear regime within 1 week of issue date to progress and retain clinical and functional gains and to limit LE progression.   Baseline dependent   Time 12   Period Weeks   Status New     OT LONG TERM GOAL #5   Title During Management Phase CDT Pt to sustain current limb volumes within 5%, and all other clinical gains achieved during OT treatment with needed level of caregiver assistance to limit LE progression and further functional decline.   Baseline dependent   Time 6   Period Months   Status New               Plan - 12/30/15 1640    Clinical Impression Statement Pt tolerated knee length compression wraps to LLE over the weekend . Spouse attempted to rewrap as instructed as  instructed . Limb volume appears mildly decreased by visual assessment , and tissue density is palpably decreased today. Pt tolerated MLD without difficulty today and he was able to perform J stroke for neck sequence with min assistt after skilled teaching.   Rehab Potential Good   Clinical Impairments Affecting Rehab Potential DM skin precautions, COPD, pain, LLE swelling, obesity, OSA   OT Frequency 3x / week   OT Duration 12 weeks   OT Treatment/Interventions Self-care/ADL training;Therapeutic exercise;Patient/family education;Manual Therapy;Manual lymph drainage;Therapeutic exercises;DME and/or AE  instruction;Compression bandaging;Therapeutic activities   Consulted and Agree with Plan of Care Patient      Patient will benefit from skilled therapeutic intervention in order to improve the following deficits and impairments:  Decreased skin integrity, Decreased knowledge of precautions, Decreased knowledge of use of DME, Impaired flexibility, Decreased mobility, Difficulty walking, Obesity, Increased edema, Decreased range of motion, Impaired sensation, Decreased endurance  Visit Diagnosis: Lymphedema, not elsewhere classified    Problem List There are no active problems to display for this patient.   Andrey Spearman, MS, OTR/L, [facility] 12/30/15 4:46 PM  [place] MAIN [facility] SERVICES [address] [place], [place], [address] Phone: [phone]   Fax:  [phone]  Name: Christopher Pruitt MRN: [id number] Date of Birth: 20-Jun-1945

## 2016-01-01 ENCOUNTER — Ambulatory Visit: Payer: Medicare Other | Admitting: Occupational Therapy

## 2016-01-01 DIAGNOSIS — I890 Lymphedema, not elsewhere classified: Secondary | ICD-10-CM | POA: Diagnosis not present

## 2016-01-02 NOTE — Therapy (Signed)
[place] MAIN [facility] SERVICES [address] [place], [place], [address] Phone: [phone]   Fax:  [phone]  Occupational Therapy Treatment  Patient Details  Name: Christopher Pruitt MRN: [id number] Date of Birth: 28-Jun-1945 Referring Provider: Herma Mering, MD  Encounter Date: 01/01/2016      OT End of Session - 01/01/16 1208    Visit Number 5   Number of Visits 36   Date for OT Re-Evaluation 03/18/16   OT Start Time 1115   OT Stop Time 1215   OT Time Calculation (min) 60 min   Activity Tolerance Patient tolerated treatment well;No increased pain   Behavior During Therapy WFL for tasks assessed/performed      Past Medical History:  Diagnosis Date  . COPD with emphysema ([place])   . Diabetes mellitus, type 2 ([place])   . Gout    STABLE  . History of prostate cancer   . Hypertension   . Hypothyroidism   . Left hydrocele   . Lymphedema of left leg   . OSA on CPAP   . Right wrist fracture    INJURY 02-01-2014 PT FELL  . Urge urinary incontinence   . Wears glasses   . Wears hearing aid    BILATERAL    Past Surgical History:  Procedure Laterality Date  . HYDROCELE EXCISION Left 02/11/2015   Procedure: LEFT HYDROCELECTOMY ADULT;  Surgeon: Nickie Retort, MD;  Location: [facility];  Service: Urology;  Laterality: Left;  . PARTIAL KNEE ARTHROPLASTY Right 2012  . PLACEMENT GOLD STUDS IN PROSTATE  [address]   for [employer] (prostate cancer)  . SHOULDER OPEN ROTATOR CUFF REPAIR Left 2010  . TONSILLECTOMY  as child    There were no vitals filed for this visit.                            OT Education - 01/01/16 1209    Education provided Yes   Education Details cont skilled instruction of LE self care- compression wrapping and simple self MLD   Person(s) Educated Patient   Methods Explanation;Demonstration;Tactile cues;Verbal cues;Handout   Comprehension Verbal  cues required;Returned demonstration;Verbalized understanding;Tactile cues required;Need further instruction             OT Long Term Goals - 12/20/15 1453      OT LONG TERM GOAL #1   Title Lymphedema (LE) self-care: Pt independent with lymphedema precautions using printed handout as reference within 2 weeks to limit LE progression.   Baseline dependent   Time 2   Period Weeks   Status New     OT LONG TERM GOAL #2   Title Lymphedema (LE) self-care: Pt able to apply multi layered, gradient compression wraps with moderate caregiver assistance to wrap foot portion using proper techniques within 2 weeks to achieve optimal limb volume reduction.   Baseline dependent   Time 2   Period Weeks   Status New     OT LONG TERM GOAL #3   Title Lymphedema (LE) self-care:  Pt to achieve at least 10% LLE limb volume reductions during Intensive CDT to limit LE progression, decrease infection risk, to reduce pain/ discomfort, and to improve safe ambulation and functional mobility.   Baseline dependent   Time 12   Period Weeks   Status New     OT LONG TERM GOAL #4   Title Lymphedema (LE) self-care:  Pt  to tolerate daily compression wraps, garments and devices in keeping w/ prescribed wear regime within 1 week of issue date to progress and retain clinical and functional gains and to limit LE progression.   Baseline dependent   Time 12   Period Weeks   Status New     OT LONG TERM GOAL #5   Title During Management Phase CDT Pt to sustain current limb volumes within 5%, and all other clinical gains achieved during OT treatment with needed level of caregiver assistance to limit LE progression and further functional decline.   Baseline dependent   Time 6   Period Months   Status New               Plan - 01/01/16 1214    Clinical Impression Statement Pt able to perform short meck sequence using proper techniques after skilled teaching today. Continue simple self MLD instruction each  visit until Pt masters all sequences to ensure optimal LE self care over time.steady progress towards goals. Pt still needs assistance with rewrapping, but is improving with directing caregiver in proper techniques. Pt    Rehab Potential Good   Clinical Impairments Affecting Rehab Potential DM skin precautions, COPD, pain, LLE swelling, obesity, OSA   OT Frequency 3x / week   OT Duration 12 weeks   OT Treatment/Interventions Self-care/ADL training;Therapeutic exercise;Patient/family education;Manual Therapy;Manual lymph drainage;Therapeutic exercises;DME and/or AE instruction;Compression bandaging;Therapeutic activities   Consulted and Agree with Plan of Care Patient      Patient will benefit from skilled therapeutic intervention in order to improve the following deficits and impairments:  Decreased skin integrity, Decreased knowledge of precautions, Decreased knowledge of use of DME, Impaired flexibility, Decreased mobility, Difficulty walking, Obesity, Increased edema, Decreased range of motion, Impaired sensation, Decreased endurance  Visit Diagnosis: Lymphedema, not elsewhere classified    Problem List There are no active problems to display for this patient.  Andrey Spearman, MS, OTR/L, [facility] 01/02/16 12:15 PM  [place] MAIN [facility] SERVICES [address] [place], [place], [address] Phone: [phone]   Fax:  [phone]  Name: Christopher Pruitt MRN: [id number] Date of Birth: 1946/03/02

## 2016-01-03 ENCOUNTER — Ambulatory Visit: Payer: Medicare Other | Admitting: Occupational Therapy

## 2016-01-03 DIAGNOSIS — I890 Lymphedema, not elsewhere classified: Secondary | ICD-10-CM

## 2016-01-03 NOTE — Therapy (Signed)
[place] MAIN [facility] SERVICES [address] [place], [place], [address] Phone: [phone]   Fax:  [phone]  Occupational Therapy Treatment  Patient Details  Name: Christopher Pruitt MRN: [id number] Date of Birth: November 07, 1945 Referring Provider: Herma Mering, MD  Encounter Date: 01/03/2016      OT End of Session - 01/03/16 1313    Visit Number 6   Number of Visits 36   Date for OT Re-Evaluation 03/18/16   OT Start Time 1100   OT Stop Time 1215   OT Time Calculation (min) 75 min   Equipment Utilized During Treatment 1215   Activity Tolerance Patient tolerated treatment well;No increased pain   Behavior During Therapy WFL for tasks assessed/performed      Past Medical History:  Diagnosis Date  . COPD with emphysema ([place])   . Diabetes mellitus, type 2 ([place])   . Gout    STABLE  . History of prostate cancer   . Hypertension   . Hypothyroidism   . Left hydrocele   . Lymphedema of left leg   . OSA on CPAP   . Right wrist fracture    INJURY 02-01-2014 PT FELL  . Urge urinary incontinence   . Wears glasses   . Wears hearing aid    BILATERAL    Past Surgical History:  Procedure Laterality Date  . HYDROCELE EXCISION Left 02/11/2015   Procedure: LEFT HYDROCELECTOMY ADULT;  Surgeon: Nickie Retort, MD;  Location: [facility];  Service: Urology;  Laterality: Left;  . PARTIAL KNEE ARTHROPLASTY Right 2012  . PLACEMENT GOLD STUDS IN PROSTATE  [address]   for [employer] (prostate cancer)  . SHOULDER OPEN ROTATOR CUFF REPAIR Left 2010  . TONSILLECTOMY  as child    There were no vitals filed for this visit.      Subjective Assessment - 01/03/16 1233    Subjective  Christopher Pruitt presents today for OT treatment visit 6. Pt has no new complaints. "It feels like my leg is better."   Patient is accompained by: Family member   Pertinent History Hx prostate cancer 2006 w/ cyberknife XRT  08/2022;    Patient Stated Goals decrease leg swelling and keep it from getting worse   Currently in Pain? No/denies   Pain Onset More than a month ago             LYMPHEDEMA/ONCOLOGY QUESTIONNAIRE - 01/03/16 1308      Right Lower Extremity Lymphedema   Other RLE A-D (ankle to below knee) limb volume =3222.20 ml. RLE AD limb volume is decreased by  2.80% since last measured on 12/24/15.    Other RLE A-G ( ankle to groin) limb volume = 6332.50 ml. RLE A-G voliume is increased by 0.30%.    Other LVD not calculated today.                 OT Treatments/Exercises (OP) - 01/03/16 0001      ADLs   ADL Education Given Yes     Manual Therapy   Manual Therapy Edema management;Compression Bandaging;Manual Lymphatic Drainage (MLD);Other (comment)   Compression Bandaging LLE gradient compression wraps applied from base of toes to below knee (NO TOE WRAPS today) under cotton stockinett. 8 cm x 5 m x 1 to foot and ankle, 10 cm  x 5 m x 1. ( NO 12 cm x5 cm wrap today). All wraps  applied circumferentially in custommary layered  gradient configuration  over .04 x 10 cm x 5 m Rosidol Soft x 1.5 rolls.  Wraps light on foot so patient is able to wear athletic shoes necessary for daily workout routine.   Other Manual Therapy LLE Manual lymph drainage to (MLD) in supine and side lying utilizing ipsilateral inguinal-axillary anastamosis, deep abdominal lymphatics via diaphragmatic breathing  and functional groin LNs as is customary to mobilize cancer related lower extremity LE. Sequence included bilateral "short neck" sequence, J strokes to sub and supraclavicular LN, deep abdominal pathways via effective breathing, (no deep strokes to colon or abdomen 2/2 chronic diarrhea);  functional inguinal LN, L lower extremity- proximal to distal -w/ emphasis on thigh and medial knee bottleneck. Performed light fibrosis technique to [facility] and distal  legs to address fatty fibrosis. Good tolerance.                 OT Education - 01/03/16 1313    Education provided Yes   Education Details Reviewed limb volumetrics results in terms of progress towards goals.             OT Long Term Goals - 12/20/15 1453      OT LONG TERM GOAL #1   Title Lymphedema (LE) self-care: Pt independent with lymphedema precautions using printed handout as reference within 2 weeks to limit LE progression.   Baseline dependent   Time 2   Period Weeks   Status New     OT LONG TERM GOAL #2   Title Lymphedema (LE) self-care: Pt able to apply multi layered, gradient compression wraps with moderate caregiver assistance to wrap foot portion using proper techniques within 2 weeks to achieve optimal limb volume reduction.   Baseline dependent   Time 2   Period Weeks   Status New     OT LONG TERM GOAL #3   Title Lymphedema (LE) self-care:  Pt to achieve at least 10% LLE limb volume reductions during Intensive CDT to limit LE progression, decrease infection risk, to reduce pain/ discomfort, and to improve safe ambulation and functional mobility.   Baseline dependent   Time 12   Period Weeks   Status New     OT LONG TERM GOAL #4   Title Lymphedema (LE) self-care:  Pt to tolerate daily compression wraps, garments and devices in keeping w/ prescribed wear regime within 1 week of issue date to progress and retain clinical and functional gains and to limit LE progression.   Baseline dependent   Time 12   Period Weeks   Status New     OT LONG TERM GOAL #5   Title During Management Phase CDT Pt to sustain current limb volumes within 5%, and all other clinical gains achieved during OT treatment with needed level of caregiver assistance to limit LE progression and further functional decline.   Baseline dependent   Time 6   Period Months   Status New               Plan - 01/03/16 1315    Clinical Impression Statement RLE comparative limb volumetrics reveal A-D ( ankle to below knee) limb  volume is decreased by 2.80%, and A-G (ankle to groin) volume is very slightly increased by 0.30% since initially measured on 12/24/15. Pt feels like limb is less heavy and less painful. Skin condition is excellent. Mild fibrosis at distal leg and malleoli is softening. ont as per POC.   Rehab Potential Good   Clinical Impairments Affecting Rehab Potential DM skin  precautions, COPD, pain, LLE swelling, obesity, OSA   OT Frequency 3x / week   OT Duration 12 weeks   OT Treatment/Interventions Self-care/ADL training;Therapeutic exercise;Patient/family education;Manual Therapy;Manual lymph drainage;Therapeutic exercises;DME and/or AE instruction;Compression bandaging;Therapeutic activities   Consulted and Agree with Plan of Care Patient      Patient will benefit from skilled therapeutic intervention in order to improve the following deficits and impairments:  Decreased skin integrity, Decreased knowledge of precautions, Decreased knowledge of use of DME, Impaired flexibility, Decreased mobility, Difficulty walking, Obesity, Increased edema, Decreased range of motion, Impaired sensation, Decreased endurance, Cardiopulmonary status limiting activity  Visit Diagnosis: Lymphedema, not elsewhere classified    Problem List There are no active problems to display for this patient.  Andrey Spearman, MS, OTR/L, CLT-LANA 01/03/16 1:20 PM  [place] MAIN [facility] SERVICES [address] [place], [place], [address] Phone: [phone]   Fax:  [phone]  Name: Christopher Pruitt MRN: [id number] Date of Birth: 09-04-1945

## 2016-01-03 NOTE — Patient Instructions (Signed)
LE instructions and precautions as established- see initial eval.   

## 2016-01-06 ENCOUNTER — Ambulatory Visit: Payer: Medicare Other | Admitting: Occupational Therapy

## 2016-01-06 DIAGNOSIS — I890 Lymphedema, not elsewhere classified: Secondary | ICD-10-CM

## 2016-01-06 NOTE — Therapy (Signed)
[place] MAIN [facility] SERVICES [address] [place], [place], [address] Phone: [phone]   Fax:  [phone]  Occupational Therapy Treatment  Patient Details  Name: Christopher Pruitt MRN: [id number] Date of Birth: Jul 24, 1945 Referring Provider: Herma Mering, MD  Encounter Date: 01/06/2016      OT End of Session - 01/06/16 1142    OT Start Time 1003   OT Stop Time 1110   OT Time Calculation (min) 67 min      Past Medical History:  Diagnosis Date  . COPD with emphysema ([place])   . Diabetes mellitus, type 2 ([place])   . Gout    STABLE  . History of prostate cancer   . Hypertension   . Hypothyroidism   . Left hydrocele   . Lymphedema of left leg   . OSA on CPAP   . Right wrist fracture    INJURY 02-01-2014 PT FELL  . Urge urinary incontinence   . Wears glasses   . Wears hearing aid    BILATERAL    Past Surgical History:  Procedure Laterality Date  . HYDROCELE EXCISION Left 02/11/2015   Procedure: LEFT HYDROCELECTOMY ADULT;  Surgeon: Christopher Retort, MD;  Location: [facility];  Service: Urology;  Laterality: Left;  . PARTIAL KNEE ARTHROPLASTY Right 2012  . PLACEMENT GOLD STUDS IN PROSTATE  [address]   for [employer] (prostate cancer)  . SHOULDER OPEN ROTATOR CUFF REPAIR Left 2010  . TONSILLECTOMY  as child    There were no vitals filed for this visit.      Subjective Assessment - 01/06/16 1136    Subjective  [name]. Tea presents today for OT treatment visit 6. Pt reports he had no difficulty over the weekend w/ LE self management. We discussed results from volumetrics taken last visit and progress towards goals todaY. Pt pleased with improvement todate. "My wife is still helping me wiyth the wraps because it's hard for me to reach my feet."   Patient is accompained by: Family member   Pertinent History Hx prostate cancer 2006 w/ cyberknife XRT 08/2022;    Limitations BLE  swelling, pain   Patient Stated Goals decrease leg swelling and keep it from getting worse   Currently in Pain? No/denies   Pain Onset More than a month ago                              OT Education - 01/06/16 1139    Education provided Yes   Education Details Continued skilled Pt/caregiver Education  And LE ADL training throughout visit for lymphedema self care, including compression wrapping, compression garment and device wear/care, lymphatic pumping ther ex, simple self-MLD, and skin care. Discussed progress towards goals.   Person(s) Educated Patient   Methods Explanation   Comprehension Verbalized understanding             OT Long Term Goals - 12/20/15 1453      OT LONG TERM GOAL #1   Title Lymphedema (LE) self-care: Pt independent with lymphedema precautions using printed handout as reference within 2 weeks to limit LE progression.   Baseline dependent   Time 2   Period Weeks   Status New     OT LONG TERM GOAL #2   Title Lymphedema (LE) self-care: Pt able to apply multi layered, gradient compression wraps with moderate caregiver assistance to wrap foot  portion using proper techniques within 2 weeks to achieve optimal limb volume reduction.   Baseline dependent   Time 2   Period Weeks   Status New     OT LONG TERM GOAL #3   Title Lymphedema (LE) self-care:  Pt to achieve at least 10% LLE limb volume reductions during Intensive CDT to limit LE progression, decrease infection risk, to reduce pain/ discomfort, and to improve safe ambulation and functional mobility.   Baseline dependent   Time 12   Period Weeks   Status New     OT LONG TERM GOAL #4   Title Lymphedema (LE) self-care:  Pt to tolerate daily compression wraps, garments and devices in keeping w/ prescribed wear regime within 1 week of issue date to progress and retain clinical and functional gains and to limit LE progression.   Baseline dependent   Time 12   Period Weeks   Status  New     OT LONG TERM GOAL #5   Title During Management Phase CDT Pt to sustain current limb volumes within 5%, and all other clinical gains achieved during OT treatment with needed level of caregiver assistance to limit LE progression and further functional decline.   Baseline dependent   Time 6   Period Months   Status New               Plan - 01/06/16 1140    Clinical Impression Statement Pt's condition continues to improve steadily. Limbb volume and tissue density is increased again today and skin wrinkles visible for forst time today are visible for first time, indicating limb volume reduction. Pt continues to need assistance with wrapping between visits. Pt working on learning simle self MLD.   Rehab Potential Good   Clinical Impairments Affecting Rehab Potential DM skin precautions, COPD, pain, LLE swelling, obesity, OSA   OT Frequency 3x / week   OT Duration 12 weeks   OT Treatment/Interventions Self-care/ADL training;Therapeutic exercise;Patient/family education;Manual Therapy;Manual lymph drainage;Therapeutic exercises;DME and/or AE instruction;Compression bandaging;Therapeutic activities   Consulted and Agree with Plan of Care Patient      Patient will benefit from skilled therapeutic intervention in order to improve the following deficits and impairments:  Decreased skin integrity, Decreased knowledge of precautions, Decreased knowledge of use of DME, Impaired flexibility, Decreased mobility, Difficulty walking, Obesity, Increased edema, Decreased range of motion, Impaired sensation, Decreased endurance, Cardiopulmonary status limiting activity  Visit Diagnosis: Lymphedema, not elsewhere classified    Problem List There are no active problems to display for this patient.   Christopher Spearman, MS, OTR/L, [facility] 01/06/16 11:44 AM   [place] MAIN [facility] SERVICES [address] [place], [place], [address] Phone: [phone]    Fax:  [phone]  Name: Christopher Pruitt MRN: [id number] Date of Birth: 1945/05/22

## 2016-01-06 NOTE — Patient Instructions (Signed)
LE instructions and precautions as established- see initial eval.   

## 2016-01-08 ENCOUNTER — Ambulatory Visit: Payer: Medicare Other | Admitting: Occupational Therapy

## 2016-01-08 DIAGNOSIS — I890 Lymphedema, not elsewhere classified: Secondary | ICD-10-CM | POA: Diagnosis not present

## 2016-01-08 NOTE — Therapy (Signed)
[place] MAIN [facility] SERVICES [address] [place], [place], [address] Phone: [phone]   Fax:  [phone]  Occupational Therapy Treatment  Patient Details  Name: IREN LONGMORE MRN: [id number] Date of Birth: 1945/10/12 Referring Provider: Herma Mering, MD  Encounter Date: 01/08/2016      OT End of Session - 01/08/16 1446    Visit Number 8   Number of Visits 36   Date for OT Re-Evaluation 03/18/16   OT Start Time [id number]   OT Stop Time 1130   OT Time Calculation (min) 75 min      Past Medical History:  Diagnosis Date  . COPD with emphysema ([place])   . Diabetes mellitus, type 2 ([place])   . Gout    STABLE  . History of prostate cancer   . Hypertension   . Hypothyroidism   . Left hydrocele   . Lymphedema of left leg   . OSA on CPAP   . Right wrist fracture    INJURY 02-01-2014 PT FELL  . Urge urinary incontinence   . Wears glasses   . Wears hearing aid    BILATERAL    Past Surgical History:  Procedure Laterality Date  . HYDROCELE EXCISION Left 02/11/2015   Procedure: LEFT HYDROCELECTOMY ADULT;  Surgeon: Nickie Retort, MD;  Location: [facility];  Service: Urology;  Laterality: Left;  . PARTIAL KNEE ARTHROPLASTY Right 2012  . PLACEMENT GOLD STUDS IN PROSTATE  [address]   for [employer] (prostate cancer)  . SHOULDER OPEN ROTATOR CUFF REPAIR Left 2010  . TONSILLECTOMY  as child    There were no vitals filed for this visit.      Subjective Assessment - 01/08/16 1440    Subjective  [name]. [place] presents today for OT treatment visit 7 to address LLE lymphedema. Pt reporting mild soreness in LLE shin area and increased itching when removing compression wraps. Discussed histamine reaction related to itching when removing compression, and soreness sometimes resulting from achieving decongestion.  Pt  states he is tolerating compression without difficulty between visits and spouse is  assisting with wraps.   Patient is accompained by: Family member   Pertinent History Hx prostate cancer 2006 w/ cyberknife XRT 08/2022;    Limitations BLE swelling, pain   Patient Stated Goals decrease leg swelling and keep it from getting worse   Currently in Pain? Yes  not rated numerically   Pain Location Leg   Pain Orientation Left   Pain Descriptors / Indicators Sore   Pain Onset Yesterday                      OT Treatments/Exercises (OP) - 01/08/16 0001      ADLs   ADL Education Given Yes     Manual Therapy   Manual Therapy Edema management;Compression Bandaging;Manual Lymphatic Drainage (MLD);Other (comment)   Compression Bandaging LLE gradient compression wraps applied from base of toes to below knee (NO TOE WRAPS today) under cotton stockinett. 8 cm x 5 m x 1 to foot and ankle, 10 cm  x 5 m x 1. ( NO 12 cm x5 cm wrap today). All wraps  applied circumferentially in custommary layered gradient configuration  over .04 x 10 cm x 5 m Rosidol Soft x 1.5 rolls.  Wraps light on foot so patient is able to wear athletic shoes necessary for daily workout routine.   Other Manual Therapy LLE  Manual lymph drainage to (MLD) in supine and side lying utilizing ipsilateral inguinal-axillary anastamosis, deep abdominal lymphatics via diaphragmatic breathing  and functional groin LNs as is customary to mobilize cancer related lower extremity LE. Sequence included bilateral "short neck" sequence, J strokes to sub and supraclavicular LN, deep abdominal pathways via effective breathing, (no deep strokes to colon or abdomen 2/2 chronic diarrhea);  functional inguinal LN, L lower extremity- proximal to distal -w/ emphasis on thigh and medial knee bottleneck. Performed light fibrosis technique to [facility] and distal  legs to address fatty fibrosis. Good tolerance.                OT Education - 01/08/16 1446    Education provided Yes             OT Long Term Goals - 12/20/15  1453      OT LONG TERM GOAL #1   Title Lymphedema (LE) self-care: Pt independent with lymphedema precautions using printed handout as reference within 2 weeks to limit LE progression.   Baseline dependent   Time 2   Period Weeks   Status New     OT LONG TERM GOAL #2   Title Lymphedema (LE) self-care: Pt able to apply multi layered, gradient compression wraps with moderate caregiver assistance to wrap foot portion using proper techniques within 2 weeks to achieve optimal limb volume reduction.   Baseline dependent   Time 2   Period Weeks   Status New     OT LONG TERM GOAL #3   Title Lymphedema (LE) self-care:  Pt to achieve at least 10% LLE limb volume reductions during Intensive CDT to limit LE progression, decrease infection risk, to reduce pain/ discomfort, and to improve safe ambulation and functional mobility.   Baseline dependent   Time 12   Period Weeks   Status New     OT LONG TERM GOAL #4   Title Lymphedema (LE) self-care:  Pt to tolerate daily compression wraps, garments and devices in keeping w/ prescribed wear regime within 1 week of issue date to progress and retain clinical and functional gains and to limit LE progression.   Baseline dependent   Time 12   Period Weeks   Status New     OT LONG TERM GOAL #5   Title During Management Phase CDT Pt to sustain current limb volumes within 5%, and all other clinical gains achieved during OT treatment with needed level of caregiver assistance to limit LE progression and further functional decline.   Baseline dependent   Time 6   Period Months   Status New               Plan - 01/08/16 1447    Clinical Impression Statement Pt managing quite well between visits. We discussed compression garment options and recommendations throughout session and decided to check insurance coverage before finalizing deciscion. I believe Pt agrees w/ my recommendation for custom, flat knit, ccl 2, knee high to meet demads of his daily  exercise regime. I sent information and demographics to vendor.   Rehab Potential Good   Clinical Impairments Affecting Rehab Potential DM skin precautions, COPD, pain, LLE swelling, obesity, OSA   OT Frequency 3x / week   OT Duration 12 weeks   OT Treatment/Interventions Self-care/ADL training;Therapeutic exercise;Patient/family education;Manual Therapy;Manual lymph drainage;Therapeutic exercises;DME and/or AE instruction;Compression bandaging;Therapeutic activities   Consulted and Agree with Plan of Care Patient      Patient will benefit from skilled therapeutic intervention in order  to improve the following deficits and impairments:  Decreased skin integrity, Decreased knowledge of precautions, Decreased knowledge of use of DME, Impaired flexibility, Decreased mobility, Difficulty walking, Obesity, Increased edema, Decreased range of motion, Impaired sensation, Decreased endurance, Cardiopulmonary status limiting activity  Visit Diagnosis: Lymphedema, not elsewhere classified    Problem List There are no active problems to display for this patient.  Andrey Spearman, MS, OTR/L, [facility] 01/08/16 2:58 PM  [place] MAIN [facility] SERVICES [address] [place], [place], [address] Phone: [phone]   Fax:  [phone]  Name: ARNETTE LANOUX MRN: [id number] Date of Birth: [birth year]

## 2016-01-10 ENCOUNTER — Ambulatory Visit: Payer: Medicare Other | Attending: Internal Medicine | Admitting: Occupational Therapy

## 2016-01-10 DIAGNOSIS — I890 Lymphedema, not elsewhere classified: Secondary | ICD-10-CM | POA: Diagnosis not present

## 2016-01-10 NOTE — Therapy (Signed)
[place] MAIN [facility] SERVICES [address] [place], [place], [address] Phone: [phone]   Fax:  [phone]  Occupational Therapy Treatment  Patient Details  Name: Christopher Pruitt MRN: [id number] Date of Birth: [birth year] Referring Provider: Herma Mering, MD  Encounter Date: 01/10/2016      OT End of Session - 01/10/16 1138    Visit Number 9   Number of Visits 36   Date for OT Re-Evaluation 03/18/16   OT Start Time 1013   OT Stop Time 1115   OT Time Calculation (min) 62 min      Past Medical History:  Diagnosis Date  . COPD with emphysema ([place])   . Diabetes mellitus, type 2 ([place])   . Gout    STABLE  . History of prostate cancer   . Hypertension   . Hypothyroidism   . Left hydrocele   . Lymphedema of left leg   . OSA on CPAP   . Right wrist fracture    INJURY 02-01-2014 PT FELL  . Urge urinary incontinence   . Wears glasses   . Wears hearing aid    BILATERAL    Past Surgical History:  Procedure Laterality Date  . HYDROCELE EXCISION Left 02/11/2015   Procedure: LEFT HYDROCELECTOMY ADULT;  Surgeon: Nickie Retort, MD;  Location: [facility];  Service: Urology;  Laterality: Left;  . PARTIAL KNEE ARTHROPLASTY Right 2012  . PLACEMENT GOLD STUDS IN PROSTATE  [address]   for [employer] (prostate cancer)  . SHOULDER OPEN ROTATOR CUFF REPAIR Left 2010  . TONSILLECTOMY  as child    There were no vitals filed for this visit.      Subjective Assessment - 01/10/16 1134    Subjective  [name] presents today for OT treatment visit 9 to address LLE lymphedema. Pt has no new concerns today. He reports that he practiced fibrosis techniques learned in clinic last session during visit interval with good success. Pt's spouse continues to assist him w/ compression wraps beteen visits.   Patient is accompained by: Family member   Pertinent History Hx prostate cancer 2006 w/ cyberknife  XRT 08/2022;    Limitations BLE swelling, pain   Patient Stated Goals decrease leg swelling and keep it from getting worse   Currently in Pain? No/denies   Pain Location Leg   Pain Orientation Left   Pain Descriptors / Indicators Sore;Other (Comment)  itching   Pain Onset 1 to 4 weeks ago                      OT Treatments/Exercises (OP) - 01/10/16 0001      Manual Therapy   Manual Therapy Edema management;Compression Bandaging;Manual Lymphatic Drainage (MLD);Other (comment)   Manual therapy comments skin care throughout MLD   Compression Bandaging LLE gradient compression wraps applied from base of toes to below knee (NO TOE WRAPS today) under cotton stockinett. 8 cm x 5 m x 1 to foot and ankle, 10 cm  x 5 m x 1. ( NO 12 cm x5 cm wrap today). All wraps  applied circumferentially in custommary layered gradient configuration  over .04 x 10 cm x 5 m Rosidol Soft x 1.5 rolls.  Wraps light on foot so patient is able to wear athletic shoes necessary for daily workout routine.                OT Education - 01/10/16  52    Education provided Yes   Education Details Continued skilled Pt/caregiver Education  And LE ADL training throughout visit for lymphedema self care w/ emphasis on simple self-MLD, and skin care. Discussed progress towards goals.   Person(s) Educated Patient   Methods Explanation   Comprehension Verbalized understanding             OT Long Term Goals - 12/20/15 1453      OT LONG TERM GOAL #1   Title Lymphedema (LE) self-care: Pt independent with lymphedema precautions using printed handout as reference within 2 weeks to limit LE progression.   Baseline dependent   Time 2   Period Weeks   Status New     OT LONG TERM GOAL #2   Title Lymphedema (LE) self-care: Pt able to apply multi layered, gradient compression wraps with moderate caregiver assistance to wrap foot portion using proper techniques within 2 weeks to achieve optimal limb volume  reduction.   Baseline dependent   Time 2   Period Weeks   Status New     OT LONG TERM GOAL #3   Title Lymphedema (LE) self-care:  Pt to achieve at least 10% LLE limb volume reductions during Intensive CDT to limit LE progression, decrease infection risk, to reduce pain/ discomfort, and to improve safe ambulation and functional mobility.   Baseline dependent   Time 12   Period Weeks   Status New     OT LONG TERM GOAL #4   Title Lymphedema (LE) self-care:  Pt to tolerate daily compression wraps, garments and devices in keeping w/ prescribed wear regime within 1 week of issue date to progress and retain clinical and functional gains and to limit LE progression.   Baseline dependent   Time 12   Period Weeks   Status New     OT LONG TERM GOAL #5   Title During Management Phase CDT Pt to sustain current limb volumes within 5%, and all other clinical gains achieved during OT treatment with needed level of caregiver assistance to limit LE progression and further functional decline.   Baseline dependent   Time 6   Period Months   Status New               Plan - 01/10/16 1140    Clinical Impression Statement Pt reports soreness and itching again today when removing knee length wraps on L. Pt denies pain with MLD and signs/ symptoms of DVT and infection are absent. Suspect itching is 2/2 histamine reaction when compression is removed and soreness is due to nearly complete swelling reduction in the leg without fluid padding around structures. Cont as per POC. Fit LLE compression garments ASAP. No reply from vendor to yesterdays request for estimate to pt yet.   Rehab Potential Good   Clinical Impairments Affecting Rehab Potential DM skin precautions, COPD, pain, LLE swelling, obesity, OSA   OT Frequency 3x / week   OT Duration 12 weeks   OT Treatment/Interventions Self-care/ADL training;Therapeutic exercise;Patient/family education;Manual Therapy;Manual lymph drainage;Therapeutic  exercises;DME and/or AE instruction;Compression bandaging;Therapeutic activities   Consulted and Agree with Plan of Care Patient      Patient will benefit from skilled therapeutic intervention in order to improve the following deficits and impairments:  Decreased skin integrity, Decreased knowledge of precautions, Decreased knowledge of use of DME, Impaired flexibility, Decreased mobility, Difficulty walking, Obesity, Increased edema, Decreased range of motion, Impaired sensation, Decreased endurance, Cardiopulmonary status limiting activity  Visit Diagnosis: Lymphedema, not elsewhere classified  Problem List There are no active problems to display for this patient.   Andrey Spearman, MS, OTR/L, [facility] 01/10/16 11:44 AM  [place] MAIN [facility] SERVICES [address] [place], [place], [address] Phone: [phone]   Fax:  [phone]  Name: Christopher Pruitt MRN: [id number] Date of Birth: 1945-12-24

## 2016-01-14 ENCOUNTER — Ambulatory Visit: Payer: Medicare Other | Admitting: Occupational Therapy

## 2016-01-14 DIAGNOSIS — I890 Lymphedema, not elsewhere classified: Secondary | ICD-10-CM

## 2016-01-14 NOTE — Therapy (Signed)
[place] MAIN [facility] SERVICES [address] [place], [place], [address] Phone: [phone]   Fax:  [phone]  Occupational Therapy Treatment Note & Progress Report  Patient Details  Name: Christopher Pruitt MRN: [id number] Date of Birth: [birth year] Referring Provider: Herma Mering, MD  Encounter Date: 01/14/2016      OT End of Session - 01/14/16 1403    Visit Number 10   Number of Visits 36   Date for OT Re-Evaluation 03/18/16   OT Start Time 1013   OT Stop Time 1110   OT Time Calculation (min) 57 min   Activity Tolerance Patient tolerated treatment well;No increased pain   Behavior During Therapy WFL for tasks assessed/performed      Past Medical History:  Diagnosis Date  . COPD with emphysema ([place])   . Diabetes mellitus, type 2 ([place])   . Gout    STABLE  . History of prostate cancer   . Hypertension   . Hypothyroidism   . Left hydrocele   . Lymphedema of left leg   . OSA on CPAP   . Right wrist fracture    INJURY 02-01-2014 PT FELL  . Urge urinary incontinence   . Wears glasses   . Wears hearing aid    BILATERAL    Past Surgical History:  Procedure Laterality Date  . HYDROCELE EXCISION Left 02/11/2015   Procedure: LEFT HYDROCELECTOMY ADULT;  Surgeon: Nickie Retort, MD;  Location: [facility];  Service: Urology;  Laterality: Left;  . PARTIAL KNEE ARTHROPLASTY Right 2012  . PLACEMENT GOLD STUDS IN PROSTATE  [address]   for [employer] (prostate cancer)  . SHOULDER OPEN ROTATOR CUFF REPAIR Left 2010  . TONSILLECTOMY  as child    There were no vitals filed for this visit.      Subjective Assessment - 01/14/16 1359    Subjective  [name]. Bapst presents today for OT treatment visit 10 to address LLE lymphedema. Pt reports he has had 3 episodes of acute , severe pain in the top of his L foot since last seen, once when turning in bed, once when walking to the mailbox, and   again this morning upon rising. Pt reports sleep deprovation as a result and he's not been to the gym in 3 days.   Patient is accompained by: Family member   Pertinent History Hx prostate cancer 2006 w/ cyberknife XRT 08/2022;    Limitations BLE swelling, pain   Patient Stated Goals decrease leg swelling and keep it from getting worse   Currently in Pain? No/denies   Pain Onset 1 to 4 weeks ago                              OT Education - 01/14/16 1402    Education provided Yes   Education Details Continued skilled Pt/caregiver Education  And LE ADL training throughout visit for lymphedema self care, including compression wrapping, compression garment and device wear/care, lymphatic pumping ther ex, simple self-MLD, and skin care. Discussed progress towards goals.   Person(s) Educated Patient   Methods Explanation   Comprehension Verbalized understanding;Returned demonstration             OT Long Term Goals - 12/20/15 1453      OT LONG TERM GOAL #1   Title Lymphedema (LE) self-care: Pt independent with lymphedema precautions using printed handout as reference  within 2 weeks to limit LE progression.   Baseline dependent   Time 2   Period Weeks   Status New     OT LONG TERM GOAL #2   Title Lymphedema (LE) self-care: Pt able to apply multi layered, gradient compression wraps with moderate caregiver assistance to wrap foot portion using proper techniques within 2 weeks to achieve optimal limb volume reduction.   Baseline dependent   Time 2   Period Weeks   Status New     OT LONG TERM GOAL #3   Title Lymphedema (LE) self-care:  Pt to achieve at least 10% LLE limb volume reductions during Intensive CDT to limit LE progression, decrease infection risk, to reduce pain/ discomfort, and to improve safe ambulation and functional mobility.   Baseline dependent   Time 12   Period Weeks   Status New     OT LONG TERM GOAL #4   Title Lymphedema (LE) self-care:  Pt  to tolerate daily compression wraps, garments and devices in keeping w/ prescribed wear regime within 1 week of issue date to progress and retain clinical and functional gains and to limit LE progression.   Baseline dependent   Time 12   Period Weeks   Status New     OT LONG TERM GOAL #5   Title During Management Phase CDT Pt to sustain current limb volumes within 5%, and all other clinical gains achieved during OT treatment with needed level of caregiver assistance to limit LE progression and further functional decline.   Baseline dependent   Time 6   Period Months   Status New               Plan - 02-07-2016 1404    Clinical Impression Statement Pt reporting acute onset of R foot pain starting this 2 days ago when turning in bed. Pt does endorse Hx of gout. OT unable to elicit pain in clinic today. Pt denies point tenderness and pain with palpation of R foot, ankle and leg during MLD. Swelling and tissue density are well managed without visible increase. Skin temperature is WNL bilaterally and signs/ symptoms of infection and DVT are absent. It's doubtful that acute onset of pain with movement and weight bearing  suddenly after 2 weeks+ of CDT is due to lymphedema.  Pt's spouse applying bandages loosely, so not constricting blood flow or  other structures causing pain.  Cont CDT as established today and Pt agrees to notify Dr immediately if severe 8/10 continues or changes from intermittent. Pt understands MD may want to rule out DVT w/ Doppler study.   Rehab Potential Good   Clinical Impairments Affecting Rehab Potential DM skin precautions, COPD, pain, LLE swelling, obesity, OSA   OT Frequency 3x / week   OT Duration 12 weeks   OT Treatment/Interventions Self-care/ADL training;Therapeutic exercise;Patient/family education;Manual Therapy;Manual lymph drainage;Therapeutic exercises;DME and/or AE instruction;Compression bandaging;Therapeutic activities   Consulted and Agree with Plan of  Care Patient      Patient will benefit from skilled therapeutic intervention in order to improve the following deficits and impairments:  Decreased skin integrity, Decreased knowledge of precautions, Decreased knowledge of use of DME, Impaired flexibility, Decreased mobility, Difficulty walking, Obesity, Increased edema, Decreased range of motion, Impaired sensation, Decreased endurance, Cardiopulmonary status limiting activity  Visit Diagnosis: Lymphedema, not elsewhere classified      G-Codes - Feb 07, 2016 1415    Functional Assessment Tool Used observation, physical exam, medical Hx review, interview, comparative limb volumetrics   Functional Limitation  Self care   Self Care Current Status [phone]) At least 60 percent but less than 80 percent impaired, limited or restricted   Self Care Goal Status [id number]) At least 1 percent but less than 20 percent impaired, limited or restricted      Problem List There are no active problems to display for this patient.   Andrey Spearman, MS, OTR/L, [facility] 01/14/16 2:18 PM  [place] MAIN [facility] SERVICES [address] [place], [place], [address] Phone: [phone]   Fax:  [phone]  Name: SHYRONE BERTSCH MRN: [id number] Date of Birth: 07/22/1945

## 2016-01-15 ENCOUNTER — Ambulatory Visit: Payer: Medicare Other | Admitting: Occupational Therapy

## 2016-01-15 ENCOUNTER — Ambulatory Visit
Admission: RE | Admit: 2016-01-15 | Discharge: 2016-01-15 | Disposition: A | Discharge status: Home / Self Care | Payer: Medicare Other | Source: Ambulatory Visit | Attending: Specialist | Admitting: Specialist

## 2016-01-15 ENCOUNTER — Other Ambulatory Visit: Payer: Self-pay | Admitting: Specialist

## 2016-01-15 DIAGNOSIS — M79605 Pain in left leg: Secondary | ICD-10-CM | POA: Diagnosis present

## 2016-01-17 ENCOUNTER — Ambulatory Visit: Payer: Medicare Other | Admitting: Occupational Therapy

## 2016-01-17 DIAGNOSIS — I890 Lymphedema, not elsewhere classified: Secondary | ICD-10-CM

## 2016-01-17 NOTE — Therapy (Signed)
[place] MAIN [facility] SERVICES [address] [place], [place], [address] Phone: [phone]   Fax:  [phone]  Occupational Therapy Treatment  Patient Details  Name: Christopher Pruitt MRN: [id number] Date of Birth: [birth year] Referring Provider: Herma Mering, MD  Encounter Date: 01/17/2016      OT End of Session - 01/17/16 1452    Visit Number 11   Number of Visits 36   Date for OT Re-Evaluation 03/18/16   OT Start Time 1110   OT Stop Time 1215   OT Time Calculation (min) 65 min   Activity Tolerance Patient tolerated treatment well;No increased pain   Behavior During Therapy WFL for tasks assessed/performed      Past Medical History:  Diagnosis Date  . COPD with emphysema ([place])   . Diabetes mellitus, type 2 ([place])   . Gout    STABLE  . History of prostate cancer   . Hypertension   . Hypothyroidism   . Left hydrocele   . Lymphedema of left leg   . OSA on CPAP   . Right wrist fracture    INJURY 02-01-2014 PT FELL  . Urge urinary incontinence   . Wears glasses   . Wears hearing aid    BILATERAL    Past Surgical History:  Procedure Laterality Date  . HYDROCELE EXCISION Left 02/11/2015   Procedure: LEFT HYDROCELECTOMY ADULT;  Surgeon: Nickie Retort, MD;  Location: [facility];  Service: Urology;  Laterality: Left;  . PARTIAL KNEE ARTHROPLASTY Right 2012  . PLACEMENT GOLD STUDS IN PROSTATE  [address]   for [employer] (prostate cancer)  . SHOULDER OPEN ROTATOR CUFF REPAIR Left 2010  . TONSILLECTOMY  as child    There were no vitals filed for this visit.      Subjective Assessment - 01/17/16 1443    Subjective  [name] presents today for OT treatment visit 11 to address LLE lymphedema. Pt reports Doppler study completed last week was negative for DVT. Pt reports he has not hear from his doctor. He reports pain in his leg and foot may be related to hx of gout.   Patient is  accompained by: Family member   Pertinent History Hx prostate cancer 2006 w/ cyberknife XRT 08/2022;    Limitations BLE swelling, pain   Patient Stated Goals decrease leg swelling and keep it from getting worse   Currently in Pain? No/denies   Pain Onset 1 to 4 weeks ago             LYMPHEDEMA/ONCOLOGY QUESTIONNAIRE - 01/17/16 1447      Right Lower Extremity Lymphedema   Other RLE A-D (ankle to below knee) limb volume=3064.65 ml.  RLE A-D  volume is decreased by 4.89% since last measured on 8/25, and decreased by 7.55% since commencing CDT on 12/24/15   Other RLE A-G ( ankle to groin) limb volume = 6040.41 ml. RLE A-G voliume is decreased by 04.61% since last measured, and by 4.33%  overall.   Other LVD not calculated today.                 OT Treatments/Exercises (OP) - 01/17/16 0001      ADLs   ADL Education Given Yes     Manual Therapy   Manual Therapy Edema management;Manual Lymphatic Drainage (MLD)   Manual therapy comments skin care throughout MLD   Edema Management assisted Pt w/ ordering recommended ccl 2  compression garments.   Manual Lymphatic Drainage (MLD) completed RLE comparative limb volumetrics   Other Manual Therapy LLE Manual lymph drainage to (MLD) in supine and side lying utilizing ipsilateral inguinal-axillary anastamosis, deep abdominal lymphatics via diaphragmatic breathing  and functional groin LNs as is customary to mobilize cancer related lower extremity LE. Sequence included bilateral "short neck" sequence, J strokes to sub and supraclavicular LN, deep abdominal pathways via effective breathing, (no deep strokes to colon or abdomen 2/2 chronic diarrhea);  functional inguinal LN, L lower extremity- proximal to distal -w/ emphasis on thigh and medial knee bottleneck. Performed light fibrosis technique to [facility] and distal  legs to address fatty fibrosis. Good tolerance.                OT Education - 01/17/16 1445    Education provided Yes    Education Details discussed limb volumetrics outcomes and reviewed progress towards goals and LE precautions   Person(s) Educated Patient   Methods Explanation   Comprehension Verbalized understanding;Need further instruction             OT Long Term Goals - 12/20/15 1453      OT LONG TERM GOAL #1   Title Lymphedema (LE) self-care: Pt independent with lymphedema precautions using printed handout as reference within 2 weeks to limit LE progression.   Baseline dependent   Time 2   Period Weeks   Status New     OT LONG TERM GOAL #2   Title Lymphedema (LE) self-care: Pt able to apply multi layered, gradient compression wraps with moderate caregiver assistance to wrap foot portion using proper techniques within 2 weeks to achieve optimal limb volume reduction.   Baseline dependent   Time 2   Period Weeks   Status New     OT LONG TERM GOAL #3   Title Lymphedema (LE) self-care:  Pt to achieve at least 10% LLE limb volume reductions during Intensive CDT to limit LE progression, decrease infection risk, to reduce pain/ discomfort, and to improve safe ambulation and functional mobility.   Baseline dependent   Time 12   Period Weeks   Status New     OT LONG TERM GOAL #4   Title Lymphedema (LE) self-care:  Pt to tolerate daily compression wraps, garments and devices in keeping w/ prescribed wear regime within 1 week of issue date to progress and retain clinical and functional gains and to limit LE progression.   Baseline dependent   Time 12   Period Weeks   Status New     OT LONG TERM GOAL #5   Title During Management Phase CDT Pt to sustain current limb volumes within 5%, and all other clinical gains achieved during OT treatment with needed level of caregiver assistance to limit LE progression and further functional decline.   Baseline dependent   Time 6   Period Months   Status New               Plan - 01/17/16 1453    Clinical Impression Statement Anatomical  measurements reveal Pt will fit into [place] ccl 2 ( 30-40 mmHg) knee highs. We agreed with plan to order those today, continue with MLD and wraps next week, then fit stockings at the end of next week.    Rehab Potential Good   Clinical Impairments Affecting Rehab Potential DM skin precautions, COPD, pain, LLE swelling, obesity, OSA   OT Frequency 3x / week   OT Duration 12 weeks   OT Treatment/Interventions  Self-care/ADL training;Therapeutic exercise;Patient/family education;Manual Therapy;Manual lymph drainage;Therapeutic exercises;DME and/or AE instruction;Compression bandaging;Therapeutic activities   Consulted and Agree with Plan of Care Patient      Patient will benefit from skilled therapeutic intervention in order to improve the following deficits and impairments:  Decreased skin integrity, Decreased knowledge of precautions, Decreased knowledge of use of DME, Impaired flexibility, Decreased mobility, Difficulty walking, Obesity, Increased edema, Decreased range of motion, Impaired sensation, Decreased endurance, Cardiopulmonary status limiting activity  Visit Diagnosis: Lymphedema, not elsewhere classified    Problem List There are no active problems to display for this patient.  Andrey Spearman, MS, OTR/L, [facility] 01/17/16 2:59 PM   [place] MAIN [facility] SERVICES [address] [place], [place], [address] Phone: [phone]   Fax:  [phone]  Name: Christopher Pruitt MRN: [id number] Date of Birth: 1945-08-20

## 2016-01-17 NOTE — Patient Instructions (Signed)
LE instructions and precautions as established- see initial eval.   

## 2016-01-20 ENCOUNTER — Ambulatory Visit: Payer: Medicare Other | Admitting: Occupational Therapy

## 2016-01-20 DIAGNOSIS — I890 Lymphedema, not elsewhere classified: Secondary | ICD-10-CM

## 2016-01-20 NOTE — Therapy (Signed)
[place] MAIN [facility] SERVICES [address] [place], [place], [address] Phone: [phone]   Fax:  [phone]  Occupational Therapy Treatment  Patient Details  Name: Christopher Pruitt MRN: [id number] Date of Birth: 1946/03/04 Referring Provider: Herma Mering, MD  Encounter Date: 01/20/2016      OT End of Session - 01/20/16 1123    Visit Number 12   Number of Visits 36   Date for OT Re-Evaluation 03/18/16   OT Start Time 1010   OT Stop Time 1110   OT Time Calculation (min) 60 min   Activity Tolerance Patient tolerated treatment well;No increased pain   Behavior During Therapy WFL for tasks assessed/performed      Past Medical History:  Diagnosis Date  . COPD with emphysema ([place])   . Diabetes mellitus, type 2 ([place])   . Gout    STABLE  . History of prostate cancer   . Hypertension   . Hypothyroidism   . Left hydrocele   . Lymphedema of left leg   . OSA on CPAP   . Right wrist fracture    INJURY 02-01-2014 PT FELL  . Urge urinary incontinence   . Wears glasses   . Wears hearing aid    BILATERAL    Past Surgical History:  Procedure Laterality Date  . HYDROCELE EXCISION Left 02/11/2015   Procedure: LEFT HYDROCELECTOMY ADULT;  Surgeon: Nickie Retort, MD;  Location: [facility];  Service: Urology;  Laterality: Left;  . PARTIAL KNEE ARTHROPLASTY Right 2012  . PLACEMENT GOLD STUDS IN PROSTATE  [address]   for [employer] (prostate cancer)  . SHOULDER OPEN ROTATOR CUFF REPAIR Left 2010  . TONSILLECTOMY  as child    There were no vitals filed for this visit.      Subjective Assessment - 01/20/16 1115    Subjective  Mr. Christopher Dicker. Pruitt presents today for OT treatment visit 12 to address LLE lymphedema. Pt reports he's had no more pain in LLE s and was able to sleep normally and return to the gym this weekend.   Patient is accompained by: Family member   Pertinent History Hx prostate  cancer 2006 w/ cyberknife XRT 08/2022;    Limitations BLE swelling, pain   Patient Stated Goals decrease leg swelling and keep it from getting worse   Currently in Pain? No/denies   Pain Onset 1 to 4 weeks ago                      OT Treatments/Exercises (OP) - 01/20/16 0001      Manual Therapy   Manual Therapy Edema management;Manual Lymphatic Drainage (MLD);Compression Bandaging   Manual therapy comments skin care throughout MLD   Compression Bandaging LLE gradient compression wraps applied from base of toes to below knee (NO TOE WRAPS today) under cotton stockinett. 8 cm x 5 m x 1 to foot and ankle, 10 cm  x 5 m x 1. ( NO 12 cm x5 cm wrap today). All wraps  applied circumferentially in custommary layered gradient configuration  over .04 x 10 cm x 5 m Rosidol Soft x 1.5 rolls.  Wraps light on foot so patient is able to wear athletic shoes necessary for daily workout routine.   Other Manual Therapy LLE Manual lymph drainage to (MLD) in supine and side lying utilizing ipsilateral inguinal-axillary anastamosis, deep abdominal lymphatics via diaphragmatic breathing  and functional groin LNs as is  customary to mobilize cancer related lower extremity LE. Sequence included bilateral "short neck" sequence, J strokes to sub and supraclavicular LN, deep abdominal pathways via effective breathing, (no deep strokes to colon or abdomen 2/2 chronic diarrhea);  functional inguinal LN, L lower extremity- proximal to distal -w/ emphasis on thigh and medial knee bottleneck. Performed light fibrosis technique to Christopher Pruitt and distal  legs to address fatty fibrosis. Good tolerance.                OT Education - 01/20/16 1123    Education provided Yes   Education Details Emphasis today on proper gradient compression wrapping using circumferential gradient techniques.    Person(s) Educated Patient   Methods Explanation   Comprehension Verbalized understanding             OT Long Term  Goals - 12/20/15 1453      OT LONG TERM GOAL #1   Title Lymphedema (LE) self-care: Pt independent with lymphedema precautions using printed handout as reference within 2 weeks to limit LE progression.   Baseline dependent   Time 2   Period Weeks   Status New     OT LONG TERM GOAL #2   Title Lymphedema (LE) self-care: Pt able to apply multi layered, gradient compression wraps with moderate caregiver assistance to wrap foot portion using proper techniques within 2 weeks to achieve optimal limb volume reduction.   Baseline dependent   Time 2   Period Weeks   Status New     OT LONG TERM GOAL #3   Title Lymphedema (LE) self-care:  Pt to achieve at least 10% LLE limb volume reductions during Intensive CDT to limit LE progression, decrease infection risk, to reduce pain/ discomfort, and to improve safe ambulation and functional mobility.   Baseline dependent   Time 12   Period Weeks   Status New     OT LONG TERM GOAL #4   Title Lymphedema (LE) self-care:  Pt to tolerate daily compression wraps, garments and devices in keeping w/ prescribed wear regime within 1 week of issue date to progress and retain clinical and functional gains and to limit LE progression.   Baseline dependent   Time 12   Period Weeks   Status New     OT LONG TERM GOAL #5   Title During Management Phase CDT Pt to sustain current limb volumes within 5%, and all other clinical gains achieved during OT treatment with needed level of caregiver assistance to limit LE progression and further functional decline.   Baseline dependent   Time 6   Period Months   Status New               Plan - 01/20/16 1124    Clinical Impression Statement Pt had no difficulty with LLE pain over the weekend. LLE is very well decongested with only miniml swelling and fibrosis at ankle and distal leg. Leg continues to respond very well to CDT. Awaiting garments to arrive and decrease OT frequency.   Rehab Potential Good   Clinical  Impairments Affecting Rehab Potential DM skin precautions, COPD, pain, LLE swelling, obesity, OSA   OT Frequency 3x / week   OT Duration 12 weeks   OT Treatment/Interventions Self-care/ADL training;Therapeutic exercise;Patient/family education;Manual Therapy;Manual lymph drainage;Therapeutic exercises;DME and/or AE instruction;Compression bandaging;Therapeutic activities   Consulted and Agree with Plan of Care Patient      Patient will benefit from skilled therapeutic intervention in order to improve the following deficits and impairments:  Decreased  skin integrity, Decreased knowledge of precautions, Decreased knowledge of use of DME, Impaired flexibility, Decreased mobility, Difficulty walking, Obesity, Increased edema, Decreased range of motion, Impaired sensation, Decreased endurance, Cardiopulmonary status limiting activity  Visit Diagnosis: Lymphedema, not elsewhere classified    Problem List There are no active problems to display for this patient.   Andrey Spearman, MS, OTR/L, [facility] 01/20/16 11:27 AM  [place] MAIN [facility] SERVICES [address] [place], [place], [address] Phone: [phone]   Fax:  [phone]  Name: Christopher Pruitt MRN: [id number] Date of Birth: [birth year]

## 2016-01-20 NOTE — Patient Instructions (Signed)
LE instructions and precautions as established- see initial eval.   

## 2016-01-22 ENCOUNTER — Ambulatory Visit: Payer: Medicare Other | Admitting: Occupational Therapy

## 2016-01-22 DIAGNOSIS — I890 Lymphedema, not elsewhere classified: Secondary | ICD-10-CM | POA: Diagnosis not present

## 2016-01-22 NOTE — Patient Instructions (Signed)
LE instructions and precautions as established- see initial eval.   

## 2016-01-22 NOTE — Therapy (Signed)
[place] MAIN [facility] SERVICES [address] [place], [place], [address] Phone: [phone]   Fax:  [phone]  Occupational Therapy Treatment  Patient Details  Name: Christopher Pruitt MRN: [id number] Date of Birth: October 13, 1945 Referring Provider: Herma Mering, MD  Encounter Date: 01/22/2016      OT End of Session - 01/22/16 1126    Visit Number 13   Number of Visits 36   Date for OT Re-Evaluation 03/18/16   OT Start Time 1015   OT Stop Time 1115   OT Time Calculation (min) 60 min   Activity Tolerance Patient tolerated treatment well;No increased pain   Behavior During Therapy WFL for tasks assessed/performed      Past Medical History:  Diagnosis Date  . COPD with emphysema ([place])   . Diabetes mellitus, type 2 ([place])   . Gout    STABLE  . History of prostate cancer   . Hypertension   . Hypothyroidism   . Left hydrocele   . Lymphedema of left leg   . OSA on CPAP   . Right wrist fracture    INJURY 02-01-2014 PT FELL  . Urge urinary incontinence   . Wears glasses   . Wears hearing aid    BILATERAL    Past Surgical History:  Procedure Laterality Date  . HYDROCELE EXCISION Left 02/11/2015   Procedure: LEFT HYDROCELECTOMY ADULT;  Surgeon: Nickie Retort, MD;  Location: [facility];  Service: Urology;  Laterality: Left;  . PARTIAL KNEE ARTHROPLASTY Right 2012  . PLACEMENT GOLD STUDS IN PROSTATE  [address]   for [employer] (prostate cancer)  . SHOULDER OPEN ROTATOR CUFF REPAIR Left 2010  . TONSILLECTOMY  as child    There were no vitals filed for this visit.      Subjective Assessment - 01/22/16 1127    Subjective  [name]. [name] presents today for OT treatment visit 13 to address LLE lymphedema. Pt denies LLE pain since last visit 2 days ago. Pt has not yet received compression garments ordered last week. Pt agrees to bring them to clinic when that arrive.   Patient is accompained  by: Family member   Pertinent History Hx prostate cancer 2006 w/ cyberknife XRT 08/2022;    Limitations BLE swelling, pain   Patient Stated Goals decrease leg swelling and keep it from getting worse   Currently in Pain? No/denies   Pain Onset 1 to 4 weeks ago                              OT Education - 01/22/16 1129    Education provided No             OT Long Term Goals - 12/20/15 1453      OT LONG TERM GOAL #1   Title Lymphedema (LE) self-care: Pt independent with lymphedema precautions using printed handout as reference within 2 weeks to limit LE progression.   Baseline dependent   Time 2   Period Weeks   Status New     OT LONG TERM GOAL #2   Title Lymphedema (LE) self-care: Pt able to apply multi layered, gradient compression wraps with moderate caregiver assistance to wrap foot portion using proper techniques within 2 weeks to achieve optimal limb volume reduction.   Baseline dependent   Time 2   Period Weeks   Status New  OT LONG TERM GOAL #3   Title Lymphedema (LE) self-care:  Pt to achieve at least 10% LLE limb volume reductions during Intensive CDT to limit LE progression, decrease infection risk, to reduce pain/ discomfort, and to improve safe ambulation and functional mobility.   Baseline dependent   Time 12   Period Weeks   Status New     OT LONG TERM GOAL #4   Title Lymphedema (LE) self-care:  Pt to tolerate daily compression wraps, garments and devices in keeping w/ prescribed wear regime within 1 week of issue date to progress and retain clinical and functional gains and to limit LE progression.   Baseline dependent   Time 12   Period Weeks   Status New     OT LONG TERM GOAL #5   Title During Management Phase CDT Pt to sustain current limb volumes within 5%, and all other clinical gains achieved during OT treatment with needed level of caregiver assistance to limit LE progression and further functional decline.   Baseline  dependent   Time 6   Period Months   Status New               Plan - 01/22/16 1129    Clinical Impression Statement Pt continues to manage compression wraps between visits with wipfe's help to rewrap. Pt back to the gym the past couple of days after bout with acute L leg pain of unknown etiology, and LLE is mildly increased in swelling. Pt may benefit from ccl 3 knee high for exercise regime and ccl 2 for less demanding daily activities and rountines for optimal management of leg swelling.   Clinical Impairments Affecting Rehab Potential DM skin precautions, COPD, pain, LLE swelling, obesity, OSA   OT Frequency 3x / week   OT Duration 12 weeks   OT Treatment/Interventions Self-care/ADL training;Therapeutic exercise;Patient/family education;Manual Therapy;Manual lymph drainage;Therapeutic exercises;DME and/or AE instruction;Compression bandaging;Therapeutic activities   Consulted and Agree with Plan of Care Patient      Patient will benefit from skilled therapeutic intervention in order to improve the following deficits and impairments:  Decreased skin integrity, Decreased knowledge of precautions, Decreased knowledge of use of DME, Impaired flexibility, Decreased mobility, Difficulty walking, Obesity, Increased edema, Decreased range of motion, Impaired sensation, Decreased endurance, Cardiopulmonary status limiting activity  Visit Diagnosis: Lymphedema, not elsewhere classified    Problem List There are no active problems to display for this patient.   Andrey Spearman, MS, OTR/L, [facility] 01/22/16 11:33 AM  [place] MAIN [facility] SERVICES [address] [place], [place], [address] Phone: [phone]   Fax:  [phone]  Name: Christopher Pruitt MRN: [id number] Date of Birth: 19-Jun-1945

## 2016-01-24 ENCOUNTER — Ambulatory Visit: Payer: Medicare Other | Admitting: Occupational Therapy

## 2016-01-24 DIAGNOSIS — I890 Lymphedema, not elsewhere classified: Secondary | ICD-10-CM

## 2016-01-24 NOTE — Patient Instructions (Signed)
LE instructions and precautions as established- see initial eval.   

## 2016-01-24 NOTE — Therapy (Signed)
[place] MAIN [facility] SERVICES [address] [place], [place], [address] Phone: [phone]   Fax:  [phone]  Occupational Therapy Treatment  Patient Details  Name: Christopher Pruitt MRN: [id number] Date of Birth: 1946/02/15 Referring Provider: Herma Mering, MD  Encounter Date: 01/24/2016      OT End of Session - 01/24/16 1308    Visit Number 14   Number of Visits 36   Date for OT Re-Evaluation 03/18/16   OT Start Time 1020   OT Stop Time 1120   OT Time Calculation (min) 60 min   Activity Tolerance Patient tolerated treatment well;No increased pain   Behavior During Therapy WFL for tasks assessed/performed      Past Medical History:  Diagnosis Date  . COPD with emphysema ([place])   . Diabetes mellitus, type 2 ([place])   . Gout    STABLE  . History of prostate cancer   . Hypertension   . Hypothyroidism   . Left hydrocele   . Lymphedema of left leg   . OSA on CPAP   . Right wrist fracture    INJURY 02-01-2014 PT FELL  . Urge urinary incontinence   . Wears glasses   . Wears hearing aid    BILATERAL    Past Surgical History:  Procedure Laterality Date  . HYDROCELE EXCISION Left 02/11/2015   Procedure: LEFT HYDROCELECTOMY ADULT;  Surgeon: Nickie Retort, MD;  Location: [facility];  Service: Urology;  Laterality: Left;  . PARTIAL KNEE ARTHROPLASTY Right 2012  . PLACEMENT GOLD STUDS IN PROSTATE  [address]   for [employer] (prostate cancer)  . SHOULDER OPEN ROTATOR CUFF REPAIR Left 2010  . TONSILLECTOMY  as child    There were no vitals filed for this visit.      Subjective Assessment - 01/24/16 1304    Subjective  Christopher Pruitt presents today for OT treatment visit 14 to address LLE lymphedema. Pt brings copy of email from online vendor for compression wrap stockins he wordered stating they have been unable to fill orders 2/2 Jodi Mourning damage in [place]. Pt opting to  waittill mid next week to utilize alternative vendor.   Patient is accompained by: Family member   Pertinent History Hx prostate cancer 2006 w/ cyberknife XRT 08/2022;    Limitations BLE swelling, pain   Patient Stated Goals decrease leg swelling and keep it from getting worse   Currently in Pain? No/denies   Pain Onset 1 to 4 weeks ago                      OT Treatments/Exercises (OP) - 01/24/16 0001      ADLs   ADL Education Given Yes     Manual Therapy   Manual Therapy Edema management;Manual Lymphatic Drainage (MLD);Compression Bandaging   Manual therapy comments skin care throughout MLD; Equate anti itch powder prior to wrapping to alleviate itching   Compression Bandaging LLE gradient compression wraps applied from base of toes to below knee (NO TOE WRAPS today) under cotton stockinett. 8 cm x 5 m x 1 to foot and ankle, 10 cm  x 5 m x 1. ( NO 12 cm x5 cm wrap today). All wraps  applied circumferentially in custommary layered gradient configuration  over .04 x 10 cm x 5 m Rosidol Soft x 1.5 rolls.  Wraps light on foot so patient is able to wear athletic shoes necessary  for daily workout routine.   Other Manual Therapy LLE Manual lymph drainage to (MLD) in supine and side lying utilizing ipsilateral inguinal-axillary anastamosis, deep abdominal lymphatics via diaphragmatic breathing  and functional groin LNs as is customary to mobilize cancer related lower extremity LE. Sequence included bilateral "short neck" sequence, J strokes to sub and supraclavicular LN, deep abdominal pathways via effective breathing, (no deep strokes to colon or abdomen 2/2 chronic diarrhea);  functional inguinal LN, L lower extremity- proximal to distal -w/ emphasis on thigh and medial knee bottleneck. Performed light fibrosis technique to [facility] and distal  legs to address fatty fibrosis. Good tolerance.                OT Education - 01/24/16 1308    Education provided No   Education  Details Continued skilled Pt/caregiver Education  And LE ADL training throughout visit for lymphedema self care, including compression wrapping, compression garment and device wear/care, lymphatic pumping ther ex, simple self-MLD, and skin care. Discussed progress towards goals.             OT Long Term Goals - 12/20/15 1453      OT LONG TERM GOAL #1   Title Lymphedema (LE) self-care: Pt independent with lymphedema precautions using printed handout as reference within 2 weeks to limit LE progression.   Baseline dependent   Time 2   Period Weeks   Status New     OT LONG TERM GOAL #2   Title Lymphedema (LE) self-care: Pt able to apply multi layered, gradient compression wraps with moderate caregiver assistance to wrap foot portion using proper techniques within 2 weeks to achieve optimal limb volume reduction.   Baseline dependent   Time 2   Period Weeks   Status New     OT LONG TERM GOAL #3   Title Lymphedema (LE) self-care:  Pt to achieve at least 10% LLE limb volume reductions during Intensive CDT to limit LE progression, decrease infection risk, to reduce pain/ discomfort, and to improve safe ambulation and functional mobility.   Baseline dependent   Time 12   Period Weeks   Status New     OT LONG TERM GOAL #4   Title Lymphedema (LE) self-care:  Pt to tolerate daily compression wraps, garments and devices in keeping w/ prescribed wear regime within 1 week of issue date to progress and retain clinical and functional gains and to limit LE progression.   Baseline dependent   Time 12   Period Weeks   Status New     OT LONG TERM GOAL #5   Title During Management Phase CDT Pt to sustain current limb volumes within 5%, and all other clinical gains achieved during OT treatment with needed level of caregiver assistance to limit LE progression and further functional decline.   Baseline dependent   Time 6   Period Months   Status New               Plan - 01/24/16 1309     Clinical Impression Statement Worsening itching in wraps limited Pt's ability to tolerate compression yesterday. Pt removed wraps and slept w/out compression. Pt encouraged to purchase OTS anti itching powder, such as Gold Bond, or equivalent store brand, or Aveno lotion, or equivalent. Excellent skin condition without rash or signs ofd infection. No histamine reaction in clinical wraps today. Cont as per POC. Fit compression garments as soion as available by vendor.   Clinical Impairments Affecting Rehab Potential DM skin precautions, COPD,  pain, LLE swelling, obesity, OSA   OT Frequency 3x / week   OT Duration 12 weeks   OT Treatment/Interventions Self-care/ADL training;Therapeutic exercise;Patient/family education;Manual Therapy;Manual lymph drainage;Therapeutic exercises;DME and/or AE instruction;Compression bandaging;Therapeutic activities   Consulted and Agree with Plan of Care Patient      Patient will benefit from skilled therapeutic intervention in order to improve the following deficits and impairments:  Decreased skin integrity, Decreased knowledge of precautions, Decreased knowledge of use of DME, Impaired flexibility, Decreased mobility, Difficulty walking, Obesity, Increased edema, Decreased range of motion, Impaired sensation, Decreased endurance, Cardiopulmonary status limiting activity  Visit Diagnosis: Lymphedema, not elsewhere classified    Problem List There are no active problems to display for this patient.   Andrey Spearman, MS, OTR/L, CLT-[facility]NA 01/24/16 1:13 PM   [place] MAIN [facility] SERVICES [address] [place], [place], [address] Phone: [phone]   Fax:  [phone]  Name: Christopher Pruitt MRN: [id number] Date of Birth: 1945/11/12

## 2016-01-27 ENCOUNTER — Ambulatory Visit: Payer: Medicare Other | Admitting: Occupational Therapy

## 2016-01-27 DIAGNOSIS — I890 Lymphedema, not elsewhere classified: Secondary | ICD-10-CM

## 2016-01-27 NOTE — Therapy (Signed)
[place] MAIN [facility] SERVICES [address] [place], [place], [address] Phone: [phone]   Fax:  [phone]  Occupational Therapy Treatment  Patient Details  Name: Christopher Pruitt MRN: [id number] Date of Birth: 1945/06/28 Referring Provider: Herma Mering, MD  Encounter Date: 01/27/2016      OT End of Session - 01/27/16 1228    Visit Number 15   Number of Visits 36   Date for OT Re-Evaluation 03/18/16   OT Start Time [id number]   OT Stop Time 1125   OT Time Calculation (min) 71 min   Activity Tolerance Patient tolerated treatment well;No increased pain   Behavior During Therapy WFL for tasks assessed/performed      Past Medical History:  Diagnosis Date  . COPD with emphysema ([place])   . Diabetes mellitus, type 2 ([place])   . Gout    STABLE  . History of prostate cancer   . Hypertension   . Hypothyroidism   . Left hydrocele   . Lymphedema of left leg   . OSA on CPAP   . Right wrist fracture    INJURY 02-01-2014 PT FELL  . Urge urinary incontinence   . Wears glasses   . Wears hearing aid    BILATERAL    Past Surgical History:  Procedure Laterality Date  . HYDROCELE EXCISION Left 02/11/2015   Procedure: LEFT HYDROCELECTOMY ADULT;  Surgeon: Nickie Retort, MD;  Location: [facility];  Service: Urology;  Laterality: Left;  . PARTIAL KNEE ARTHROPLASTY Right 2012  . PLACEMENT GOLD STUDS IN PROSTATE  [address]   for [employer] (prostate cancer)  . SHOULDER OPEN ROTATOR CUFF REPAIR Left 2010  . TONSILLECTOMY  as child    There were no vitals filed for this visit.      Subjective Assessment - 01/27/16 1224    Subjective  [name] presents today for OT treatment visit 15 to address LLE lymphedema. Pt and OT call vendor for compression garments to check status, and vendor states garments have been shipped, but they are unable to give ETA.   Patient is accompained by: Family member    Pertinent History Hx prostate cancer 2006 w/ cyberknife XRT 08/2022;    Limitations BLE swelling, pain   Patient Stated Goals decrease leg swelling and keep it from getting worse   Currently in Pain? No/denies   Pain Onset 1 to 4 weeks ago                      OT Treatments/Exercises (OP) - 01/27/16 0001      ADLs   ADL Education Given Yes     Manual Therapy   Manual Therapy Edema management;Manual Lymphatic Drainage (MLD);Compression Bandaging   Manual therapy comments skin care throughout MLD; Equate anti itch powder prior to wrapping to alleviate itching   Compression Bandaging LLE gradient compression wraps applied from base of toes to below knee (NO TOE WRAPS today) under cotton stockinett. 8 cm x 5 m x 1 to foot and ankle, 10 cm  x 5 m x 1. ( NO 12 cm x5 cm wrap today). All wraps  applied circumferentially in custommary layered gradient configuration  over .04 x 10 cm x 5 m Rosidol Soft x 1.5 rolls.  Wraps light on foot so patient is able to wear athletic shoes necessary for daily workout routine.   Other Manual Therapy LLE Manual lymph drainage to (  MLD) in supine and side lying utilizing ipsilateral inguinal-axillary anastamosis, deep abdominal lymphatics via diaphragmatic breathing  and functional groin LNs as is customary to mobilize cancer related lower extremity LE. Sequence included bilateral "short neck" sequence, J strokes to sub and supraclavicular LN, deep abdominal pathways via effective breathing, (no deep strokes to colon or abdomen 2/2 chronic diarrhea);  functional inguinal LN, L lower extremity- proximal to distal -w/ emphasis on thigh and medial knee bottleneck. Performed light fibrosis technique to [facility] and distal  legs to address fatty fibrosis. Good tolerance.                OT Education - 01/27/16 1228    Education provided No   Education Details Provided skilled Pt/caregiver Education and LE ADL training throughout visit for lymphedema self  care protocols, including compression wrapping/ garment and device wear/care, lymphatic pumping ther ex, simple self-MLD, and skin care. Discussed progress towards goals and POC.   Person(s) Educated Patient   Methods Explanation   Comprehension Verbalized understanding;Need further instruction             OT Long Term Goals - 12/20/15 1453      OT LONG TERM GOAL #1   Title Lymphedema (LE) self-care: Pt independent with lymphedema precautions using printed handout as reference within 2 weeks to limit LE progression.   Baseline dependent   Time 2   Period Weeks   Status New     OT LONG TERM GOAL #2   Title Lymphedema (LE) self-care: Pt able to apply multi layered, gradient compression wraps with moderate caregiver assistance to wrap foot portion using proper techniques within 2 weeks to achieve optimal limb volume reduction.   Baseline dependent   Time 2   Period Weeks   Status New     OT LONG TERM GOAL #3   Title Lymphedema (LE) self-care:  Pt to achieve at least 10% LLE limb volume reductions during Intensive CDT to limit LE progression, decrease infection risk, to reduce pain/ discomfort, and to improve safe ambulation and functional mobility.   Baseline dependent   Time 12   Period Weeks   Status New     OT LONG TERM GOAL #4   Title Lymphedema (LE) self-care:  Pt to tolerate daily compression wraps, garments and devices in keeping w/ prescribed wear regime within 1 week of issue date to progress and retain clinical and functional gains and to limit LE progression.   Baseline dependent   Time 12   Period Weeks   Status New     OT LONG TERM GOAL #5   Title During Management Phase CDT Pt to sustain current limb volumes within 5%, and all other clinical gains achieved during OT treatment with needed level of caregiver assistance to limit LE progression and further functional decline.   Baseline dependent   Time 6   Period Months   Status New               Plan  - 01/27/16 1229    Clinical Impression Statement Pt performed LE self care over the weekend with assistance from his wife for compression wraping. RLE continues to decrease in volume and tissue density. Ankle area icontinues to be the most stubbort area of fibrosis. Garments have shipped, so fitting most likely later this week. Cont to review simple self-MLD . Moving forward towards decreasing Rx frequency and transition into Management Phase CDT- Provided DVD with instructional video today.   Clinical Impairments Affecting Rehab  Potential DM skin precautions, COPD, pain, LLE swelling, obesity, OSA   OT Frequency 3x / week   OT Duration 12 weeks   OT Treatment/Interventions Self-care/ADL training;Therapeutic exercise;Patient/family education;Manual Therapy;Manual lymph drainage;Therapeutic exercises;DME and/or AE instruction;Compression bandaging;Therapeutic activities   Consulted and Agree with Plan of Care Patient      Patient will benefit from skilled therapeutic intervention in order to improve the following deficits and impairments:  Decreased skin integrity, Decreased knowledge of precautions, Decreased knowledge of use of DME, Impaired flexibility, Decreased mobility, Difficulty walking, Obesity, Increased edema, Decreased range of motion, Impaired sensation, Decreased endurance, Cardiopulmonary status limiting activity  Visit Diagnosis: Lymphedema, not elsewhere classified    Problem List There are no active problems to display for this patient.   Andrey Spearman, MS, OTR/L, [facility] 01/27/16 12:33 PM  [place] MAIN [facility] SERVICES [address] [place], [place], [address] Phone: [phone]   Fax:  [phone]  Name: Christopher Pruitt MRN: [id number] Date of Birth: 1946-04-14

## 2016-01-27 NOTE — Patient Instructions (Signed)
LE instructions and precautions as established- see initial eval.   

## 2016-01-29 ENCOUNTER — Ambulatory Visit: Payer: Medicare Other | Admitting: Occupational Therapy

## 2016-01-29 DIAGNOSIS — I890 Lymphedema, not elsewhere classified: Secondary | ICD-10-CM | POA: Diagnosis not present

## 2016-01-29 NOTE — Therapy (Signed)
[place] MAIN [facility] SERVICES [address] [place], [place], [address] Phone: [phone]   Fax:  [phone]  Occupational Therapy Treatment  Patient Details  Name: FINNEUS KANESHIRO MRN: [id number] Date of Birth: [birth year] Referring Provider: Herma Mering, MD  Encounter Date: 01/29/2016      OT End of Session - 01/29/16 1336    Visit Number 16   Number of Visits 36   Date for OT Re-Evaluation 03/18/16   OT Start Time 1000   OT Stop Time 1110   OT Time Calculation (min) 70 min   Activity Tolerance Patient tolerated treatment well;No increased pain   Behavior During Therapy WFL for tasks assessed/performed      Past Medical History:  Diagnosis Date  . COPD with emphysema ([place])   . Diabetes mellitus, type 2 ([place])   . Gout    STABLE  . History of prostate cancer   . Hypertension   . Hypothyroidism   . Left hydrocele   . Lymphedema of left leg   . OSA on CPAP   . Right wrist fracture    INJURY 02-01-2014 PT FELL  . Urge urinary incontinence   . Wears glasses   . Wears hearing aid    BILATERAL    Past Surgical History:  Procedure Laterality Date  . HYDROCELE EXCISION Left 02/11/2015   Procedure: LEFT HYDROCELECTOMY ADULT;  Surgeon: Nickie Retort, MD;  Location: [facility];  Service: Urology;  Laterality: Left;  . PARTIAL KNEE ARTHROPLASTY Right 2012  . PLACEMENT GOLD STUDS IN PROSTATE  [address]   for [employer] (prostate cancer)  . SHOULDER OPEN ROTATOR CUFF REPAIR Left 2010  . TONSILLECTOMY  as child    There were no vitals filed for this visit.      Subjective Assessment - 01/29/16 1330    Subjective  [name]. [place] presents today for OT treatment visit 16 to address LLE lymphedema. Pt brings recommended compression garments to clinic.   Patient is accompained by: Family member   Pertinent History Hx prostate cancer 2006 w/ cyberknife XRT 08/2022;    Limitations BLE  swelling, pain   Patient Stated Goals decrease leg swelling and keep it from getting worse   Currently in Pain? No/denies   Pain Onset 1 to 4 weeks ago             LYMPHEDEMA/ONCOLOGY QUESTIONNAIRE - 01/29/16 1338      Right Lower Extremity Lymphedema   Other RLE A-D (ankle to below knee) limb volume=2969.22  ml.  RLE A-D  volume is decreased by 3.11% since last measured on 9/8., and decreased by 10.43% since commencing CDT on 12/24/15   Other RLE A-G (ankle to groin) limb volume = 6091.65 ml. RLE A-G voliume is increased by 0.85% since last measured, and by 3.52%  overall.   Other Final A-D LVD measures 1.99%, which is WNL. Final A-G LVD = 2.95%, R>L, which is also WNL for dominant limb.                 OT Treatments/Exercises (OP) - 01/29/16 0001      Manual Therapy   Manual Therapy Edema management;Manual Lymphatic Drainage (MLD)   Manual therapy comments Fitted recommended knee length elastic compression garments today. JUZO dynamic A-D ccl 2 ( 30-40 mmHg) closed toe, no SB)   Manual Lymphatic Drainage (MLD) completed RLE comparative limb volumetrics  OT Education - 01/29/16 1331    Education provided No   Education Details Emphasis of self care training today on compression garment wear and care, and donning / doffing using appropriate assistive devices. Reviewed simple self MLD and head neck MLD for use to limit pain  and swelling 2/2 sinus infections in future. Reviewed LE self care routine, reviewed progress towards goals, and POC for Management Phase CDT transition.   Person(s) Educated Patient   Methods Explanation;Demonstration;Tactile cues;Verbal cues   Comprehension Verbalized understanding;Returned demonstration;Verbal cues required;Tactile cues required             OT Long Term Goals - 01/29/16 1348      OT LONG TERM GOAL #1   Title Lymphedema (LE) self-care: Pt independent with lymphedema precautions using printed handout as  reference within 2 weeks to limit LE progression.   Baseline dependent   Time 2   Period Weeks   Status Achieved     OT LONG TERM GOAL #2   Title Lymphedema (LE) self-care: Pt able to apply multi layered, gradient compression wraps with moderate caregiver assistance to wrap foot portion using proper techniques within 2 weeks to achieve optimal limb volume reduction.   Baseline dependent   Time 2   Period Weeks   Status Achieved     OT LONG TERM GOAL #3   Title Lymphedema (LE) self-care:  Pt to achieve at least 10% LLE limb volume reductions during Intensive CDT to limit LE progression, decrease infection risk, to reduce pain/ discomfort, and to improve safe ambulation and functional mobility.   Baseline dependent   Time 12   Period Weeks   Status Achieved     OT LONG TERM GOAL #4   Title Lymphedema (LE) self-care:  Pt to tolerate daily compression wraps, garments and devices in keeping w/ prescribed wear regime within 1 week of issue date to progress and retain clinical and functional gains and to limit LE progression.   Baseline dependent   Time 12   Period Weeks   Status Achieved     OT LONG TERM GOAL #5   Title During Management Phase CDT Pt to sustain current limb volumes within 5%, and all other clinical gains achieved during OT treatment with needed level of caregiver assistance to limit LE progression and further functional decline.   Baseline dependent   Time 6   Period Months   Status On-going               Plan - 01/29/16 1338    Clinical Impression Statement Adter skilled teaching and/ or review of all home program components Pt independent with simple self MLD, ther ex, compression and skin care. Pt able to don/ doff garments w/out assistive devices. RLE comparative limb volumetrics reveal GOAL MET with 10.43% limb volume reduction below the knee. Overall reduction for full leg measures 3.5% since commencing CDT on 12/24/15. Limb Volume Differential (LVD)  measures WNL for dominant limb today w/ 1.99%, R>L below the knee, and 2.95% R>L from ankle to groin. Juzo, ccl 2 ( 30-40 mmHg) closed toe, knee length, elastic compression garments fit appropriately and are comfortable. Pt agrees w/ plan to perform all self care daily as instructed and to return in 1 week for volumetrics. Pt agrees with plan: If limb volumes remain stable we'll consider 1 month F/U and then DC OT.   Clinical Impairments Affecting Rehab Potential DM skin precautions, COPD, pain, LLE swelling, obesity, OSA   OT Frequency 3x /  week   OT Duration 12 weeks   OT Treatment/Interventions Self-care/ADL training;Therapeutic exercise;Patient/family education;Manual Therapy;Manual lymph drainage;Therapeutic exercises;DME and/or AE instruction;Compression bandaging;Therapeutic activities   Consulted and Agree with Plan of Care Patient      Patient will benefit from skilled therapeutic intervention in order to improve the following deficits and impairments:  Decreased skin integrity, Decreased knowledge of precautions, Decreased knowledge of use of DME, Impaired flexibility, Decreased mobility, Difficulty walking, Obesity, Increased edema, Decreased range of motion, Impaired sensation, Decreased endurance, Cardiopulmonary status limiting activity  Visit Diagnosis: Lymphedema, not elsewhere classified    Problem List There are no active problems to display for this patient.   Andrey Spearman, MS, OTR/L, [facility] 01/29/16 2:05 PM  [place] MAIN [facility] SERVICES [address] [place], [place], [address] Phone: [phone]   Fax:  [phone]  Name: ZAILEN ALBARRAN MRN: [id number] Date of Birth: 30-May-1945

## 2016-01-31 ENCOUNTER — Ambulatory Visit: Payer: Medicare Other | Admitting: Occupational Therapy

## 2016-02-03 ENCOUNTER — Ambulatory Visit: Payer: Medicare Other | Admitting: Occupational Therapy

## 2016-02-05 ENCOUNTER — Ambulatory Visit: Payer: Medicare Other | Admitting: Occupational Therapy

## 2016-02-06 ENCOUNTER — Ambulatory Visit: Payer: Medicare Other | Admitting: Occupational Therapy

## 2016-02-06 DIAGNOSIS — I890 Lymphedema, not elsewhere classified: Secondary | ICD-10-CM | POA: Diagnosis not present

## 2016-02-06 NOTE — Patient Instructions (Signed)

## 2016-02-06 NOTE — Therapy (Signed)
[place] MAIN [facility] SERVICES [address] [place], [place], [address] Phone: [phone]   Fax:  [phone]  Occupational Therapy Treatment  Patient Details  Name: Christopher Pruitt MRN: [id number] Date of Birth: 01-Feb-1946 Referring Provider: Herma Mering, MD  Encounter Date: 02/06/2016      OT End of Session - 02/06/16 1207    Visit Number 17   Number of Visits 36   Date for OT Re-Evaluation 03/18/16   Activity Tolerance Patient tolerated treatment well;No increased pain   Behavior During Therapy WFL for tasks assessed/performed      Past Medical History:  Diagnosis Date  . COPD with emphysema ([place])   . Diabetes mellitus, type 2 ([place])   . Gout    STABLE  . History of prostate cancer   . Hypertension   . Hypothyroidism   . Left hydrocele   . Lymphedema of left leg   . OSA on CPAP   . Right wrist fracture    INJURY 02-01-2014 PT FELL  . Urge urinary incontinence   . Wears glasses   . Wears hearing aid    BILATERAL    Past Surgical History:  Procedure Laterality Date  . HYDROCELE EXCISION Left 02/11/2015   Procedure: LEFT HYDROCELECTOMY ADULT;  Surgeon: Nickie Retort, MD;  Location: [facility];  Service: Urology;  Laterality: Left;  . PARTIAL KNEE ARTHROPLASTY Right 2012  . PLACEMENT GOLD STUDS IN PROSTATE  [address]   for [employer] (prostate cancer)  . SHOULDER OPEN ROTATOR CUFF REPAIR Left 2010  . TONSILLECTOMY  as child    There were no vitals filed for this visit.      Subjective Assessment - 02/06/16 1202    Subjective  [name]. [place] presents today for OT treatment visit 17. to address LLE lymphedema. Today is 1 week follow up since transitioning to Management Phase CDT. Pt states he is managing swelling very well using all self care protocols he learned in clinic daily as directed.   Patient is accompained by: Family member   Pertinent History Hx prostate  cancer 2006 w/ cyberknife XRT 08/2022;    Limitations BLE swelling, pain   Patient Stated Goals decrease leg swelling and keep it from getting worse   Currently in Pain? No/denies   Pain Onset 1 to 4 weeks ago                      OT Treatments/Exercises (OP) - 02/06/16 0001      ADLs   ADL Education Given Yes     Manual Therapy   Manual Therapy Edema management;Manual Lymphatic Drainage (MLD)   Manual therapy comments garment assessment   Other Manual Therapy LLE Manual lymph drainage to (MLD) in supine and side lying utilizing ipsilateral inguinal-axillary anastamosis, deep abdominal lymphatics via diaphragmatic breathing  and functional groin LNs as is customary to mobilize cancer related lower extremity LE. Sequence included bilateral "short neck" sequence, J strokes to sub and supraclavicular LN, deep abdominal pathways via effective breathing, (no deep strokes to colon or abdomen 2/2 chronic diarrhea);  functional inguinal LN, L lower extremity- proximal to distal -w/ emphasis on thigh and medial knee bottleneck. Performed light fibrosis technique to [facility] and distal  legs to address fatty fibrosis. Good tolerance.                OT Education - 02/06/16 1205    Education  provided No   Education Details .Reviewed all LE self care protocols. Pt independent and 100% compliant by report. GOAL met. Also taught simple self MLD to face to assist w/ swelling reduction and  sinus decongestion for frequent sinusitis   Person(s) Educated Patient   Methods Explanation;Demonstration;Tactile cues;Verbal cues;Handout   Comprehension Verbalized understanding;Returned demonstration;Other (comment)  Pt independent with all LE self care protocols, inclusing simple self MLD of  BLE, skin care, compression garments and ther ex.             OT Long Term Goals - 01/29/16 1348      OT LONG TERM GOAL #1   Title Lymphedema (LE) self-care: Pt independent with lymphedema  precautions using printed handout as reference within 2 weeks to limit LE progression.   Baseline dependent   Time 2   Period Weeks   Status Achieved     OT LONG TERM GOAL #2   Title Lymphedema (LE) self-care: Pt able to apply multi layered, gradient compression wraps with moderate caregiver assistance to wrap foot portion using proper techniques within 2 weeks to achieve optimal limb volume reduction.   Baseline dependent   Time 2   Period Weeks   Status Achieved     OT LONG TERM GOAL #3   Title Lymphedema (LE) self-care:  Pt to achieve at least 10% LLE limb volume reductions during Intensive CDT to limit LE progression, decrease infection risk, to reduce pain/ discomfort, and to improve safe ambulation and functional mobility.   Baseline dependent   Time 12   Period Weeks   Status Achieved     OT LONG TERM GOAL #4   Title Lymphedema (LE) self-care:  Pt to tolerate daily compression wraps, garments and devices in keeping w/ prescribed wear regime within 1 week of issue date to progress and retain clinical and functional gains and to limit LE progression.   Baseline dependent   Time 12   Period Weeks   Status Achieved     OT LONG TERM GOAL #5   Title During Management Phase CDT Pt to sustain current limb volumes within 5%, and all other clinical gains achieved during OT treatment with needed level of caregiver assistance to limit LE progression and further functional decline.   Baseline dependent   Time 6   Period Months   Status On-going               Plan - 02/06/16 1208    Clinical Impression Statement Pt independent with all LE self care protocols, inclusing simple self MLD of  BLE, skin care, compression garments and ther ex. Pt reports 100% compliance w/ daily LE self care since last seen 1 week ago. Pt is successfully using recommended asery assistive devices for donning and doffing garments. Garment appears to be containing LLE swelling very well. Pt agrees w/ plan  fo f/u in 1 month.   Clinical Impairments Affecting Rehab Potential DM skin precautions, COPD, pain, LLE swelling, obesity, OSA   OT Frequency 3x / week   OT Duration 12 weeks   OT Treatment/Interventions Self-care/ADL training;Therapeutic exercise;Patient/family education;Manual Therapy;Manual lymph drainage;Therapeutic exercises;DME and/or AE instruction;Compression bandaging;Therapeutic activities   Consulted and Agree with Plan of Care Patient      Patient will benefit from skilled therapeutic intervention in order to improve the following deficits and impairments:  Decreased skin integrity, Decreased knowledge of precautions, Decreased knowledge of use of DME, Impaired flexibility, Decreased mobility, Difficulty walking, Obesity, Increased edema, Decreased range of motion,  Impaired sensation, Decreased endurance, Cardiopulmonary status limiting activity  Visit Diagnosis: Lymphedema, not elsewhere classified    Problem List There are no active problems to display for this patient.   Andrey Spearman, MS, OTR/L, [facility] 02/06/16 12:11 PM  [place] MAIN [facility] SERVICES [address] [place], [place], [address] Phone: [phone]   Fax:  [phone]  Name: QADIR FOLKS MRN: [id number] Date of Birth: Dec 06, 1945

## 2016-02-07 ENCOUNTER — Encounter: Payer: No Typology Code available for payment source | Admitting: Occupational Therapy

## 2016-02-28 ENCOUNTER — Other Ambulatory Visit: Payer: Self-pay | Admitting: Podiatry

## 2016-02-28 ENCOUNTER — Other Ambulatory Visit: Payer: Self-pay | Admitting: Internal Medicine

## 2016-02-28 DIAGNOSIS — M25572 Pain in left ankle and joints of left foot: Secondary | ICD-10-CM

## 2016-03-04 ENCOUNTER — Ambulatory Visit: Payer: Medicare Other | Attending: Internal Medicine | Admitting: Occupational Therapy

## 2016-03-04 DIAGNOSIS — I890 Lymphedema, not elsewhere classified: Secondary | ICD-10-CM | POA: Diagnosis present

## 2016-03-09 NOTE — Patient Instructions (Signed)
LE instructions and precautions as established- see initial eval.   

## 2016-03-09 NOTE — Therapy (Signed)
[place] MAIN [facility] SERVICES [address] [place], [place], [address] Phone: [phone]   Fax:  [phone]  Occupational Therapy Treatment  Patient Details  Name: Christopher Pruitt MRN: [id number] Date of Birth: 15-Feb-1946 Referring Provider: Herma Mering, MD  Encounter Date: 03/04/2016    Past Medical History:  Diagnosis Date  . COPD with emphysema ([place])   . Diabetes mellitus, type 2 ([place])   . Gout    STABLE  . History of prostate cancer   . Hypertension   . Hypothyroidism   . Left hydrocele   . Lymphedema of left leg   . OSA on CPAP   . Right wrist fracture    INJURY 02-01-2014 PT FELL  . Urge urinary incontinence   . Wears glasses   . Wears hearing aid    BILATERAL    Past Surgical History:  Procedure Laterality Date  . HYDROCELE EXCISION Left 02/11/2015   Procedure: LEFT HYDROCELECTOMY ADULT;  Surgeon: Nickie Retort, MD;  Location: [facility];  Service: Urology;  Laterality: Left;  . PARTIAL KNEE ARTHROPLASTY Right 2012  . PLACEMENT GOLD STUDS IN PROSTATE  [address]   for [employer] (prostate cancer)  . SHOULDER OPEN ROTATOR CUFF REPAIR Left 2010  . TONSILLECTOMY  as child    There were no vitals filed for this visit.      Subjective Assessment - 03/09/16 1636    Subjective  Christopher Pruitt presents today for OT treatment visit 10. to address LLE lymphedema. Today is 1 week follow up since transitioning to Management Phase CDT. Pt states he is managing swelling very well using all self care protocols he learned in clinic daily as directed.   Patient is accompained by: Family member   Pertinent History Hx prostate cancer 2006 w/ cyberknife XRT 08/2022;    Limitations BLE swelling, pain   Patient Stated Goals decrease leg swelling and keep it from getting worse   Pain Onset 1 to 4 weeks ago             LYMPHEDEMA/ONCOLOGY QUESTIONNAIRE - 03/09/16 1643      Right Lower Extremity Lymphedema   Other --   Other --   Other --                 OT Treatments/Exercises (OP) - 03/09/16 0001      ADLs   ADL Education Given Yes     Manual Therapy   Manual Therapy Edema management;Other (comment)  Compression garment assessment, skin assessment   Manual therapy comments garment assessment   Edema Management skin assessment   Manual Lymphatic Drainage (MLD) completed BLE comparative limb volumetrics                OT Education - 03/09/16 1649    Education provided Yes   Education Details Con tinued skilled Pt/caregiver Education  And LE ADL training throughout visit for lymphedema self care, including compression wrapping, compression garment and device wear/care, lymphatic pumping ther ex, simple self-MLD, and skin care. Discussed progress towards goals.   Person(s) Educated Patient   Methods Explanation;Demonstration;Handout   Comprehension Verbalized understanding             OT Long Term Goals - 01/29/16 1348      OT LONG TERM GOAL #1   Title Lymphedema (LE) self-care: Pt independent with lymphedema precautions using printed handout as reference within 2 weeks to limit LE progression.  Baseline dependent   Time 2   Period Weeks   Status Achieved     OT LONG TERM GOAL #2   Title Lymphedema (LE) self-care: Pt able to apply multi layered, gradient compression wraps with moderate caregiver assistance to wrap foot portion using proper techniques within 2 weeks to achieve optimal limb volume reduction.   Baseline dependent   Time 2   Period Weeks   Status Achieved     OT LONG TERM GOAL #3   Title Lymphedema (LE) self-care:  Pt to achieve at least 10% LLE limb volume reductions during Intensive CDT to limit LE progression, decrease infection risk, to reduce pain/ discomfort, and to improve safe ambulation and functional mobility.   Baseline dependent   Time 12   Period Weeks   Status Achieved     OT LONG TERM  GOAL #4   Title Lymphedema (LE) self-care:  Pt to tolerate daily compression wraps, garments and devices in keeping w/ prescribed wear regime within 1 week of issue date to progress and retain clinical and functional gains and to limit LE progression.   Baseline dependent   Time 12   Period Weeks   Status Achieved     OT LONG TERM GOAL #5   Title During Management Phase CDT Pt to sustain current limb volumes within 5%, and all other clinical gains achieved during OT treatment with needed level of caregiver assistance to limit LE progression and further functional decline.   Baseline dependent   Time 6   Period Months   Status On-going             Patient will benefit from skilled therapeutic intervention in order to improve the following deficits and impairments:  Decreased skin integrity, Decreased knowledge of precautions, Decreased knowledge of use of DME, Impaired flexibility, Decreased mobility, Difficulty walking, Obesity, Increased edema, Decreased range of motion, Impaired sensation, Decreased endurance, Cardiopulmonary status limiting activity  Visit Diagnosis: Lymphedema, not elsewhere classified    Problem List There are no active problems to display for this patient.   Andrey Spearman, MS, OTR/L, [facility] 03/09/16 4:57 PM  [place] MAIN [facility] SERVICES [address] [place], [place], [address] Phone: [phone]   Fax:  [phone]  Name: Christopher Pruitt MRN: [id number] Date of Birth: 01/23/1946

## 2016-03-12 ENCOUNTER — Ambulatory Visit
Admission: RE | Admit: 2016-03-12 | Discharge: 2016-03-12 | Disposition: A | Discharge status: Home / Self Care | Payer: Medicare Other | Source: Ambulatory Visit | Attending: Podiatry | Admitting: Podiatry

## 2016-03-12 ENCOUNTER — Encounter (INDEPENDENT_AMBULATORY_CARE_PROVIDER_SITE_OTHER): Payer: Self-pay

## 2016-03-12 DIAGNOSIS — M659 Synovitis and tenosynovitis, unspecified: Secondary | ICD-10-CM | POA: Insufficient documentation

## 2016-03-12 DIAGNOSIS — M9262 Juvenile osteochondrosis of tarsus, left ankle: Secondary | ICD-10-CM | POA: Insufficient documentation

## 2016-03-12 DIAGNOSIS — M25572 Pain in left ankle and joints of left foot: Secondary | ICD-10-CM | POA: Diagnosis present

## 2016-06-03 DIAGNOSIS — E039 Hypothyroidism, unspecified: Secondary | ICD-10-CM | POA: Insufficient documentation

## 2016-06-04 ENCOUNTER — Ambulatory Visit: Payer: Medicare Other | Attending: Internal Medicine | Admitting: Occupational Therapy

## 2016-06-04 DIAGNOSIS — I890 Lymphedema, not elsewhere classified: Secondary | ICD-10-CM

## 2016-06-04 NOTE — Therapy (Signed)
[place] MAIN [facility] SERVICES [address] [place], [place], [address] Phone: [phone]   Fax:  [phone]  Occupational Therapy Treatment Note and Discharge Summary  Patient Details  Name: Christopher Pruitt MRN: [id number] Date of Birth: 25-May-1945 Referring Provider: Herma Mering, MD  Encounter Date: 06/04/2016      OT End of Session - 06/04/16 1008    Visit Number 19   Number of Visits 36   Date for OT Re-Evaluation 03/18/16   OT Start Time 0803   OT Stop Time 0903   OT Time Calculation (min) 60 min   Activity Tolerance Patient tolerated treatment well;No increased pain   Behavior During Therapy WFL for tasks assessed/performed      Past Medical History:  Diagnosis Date  . COPD with emphysema ([place])   . Diabetes mellitus, type 2 ([place])   . Gout    STABLE  . History of prostate cancer   . Hypertension   . Hypothyroidism   . Left hydrocele   . Lymphedema of left leg   . OSA on CPAP   . Right wrist fracture    INJURY 02-01-2014 PT FELL  . Urge urinary incontinence   . Wears glasses   . Wears hearing aid    BILATERAL    Past Surgical History:  Procedure Laterality Date  . HYDROCELE EXCISION Left 02/11/2015   Procedure: LEFT HYDROCELECTOMY ADULT;  Surgeon: Nickie Retort, MD;  Location: [facility];  Service: Urology;  Laterality: Left;  . PARTIAL KNEE ARTHROPLASTY Right 2012  . PLACEMENT GOLD STUDS IN PROSTATE  [address]   for [employer] (prostate cancer)  . SHOULDER OPEN ROTATOR CUFF REPAIR Left 2010  . TONSILLECTOMY  as child    There were no vitals filed for this visit.      Subjective Assessment - 06/04/16 0948    Subjective  [name]. Christopher Pruitt presents today for OT treatment visit 19 for 3 month follow up on LLE Intensive Phase lymphedema care completed on 03/04/16. Pt transitioned to Management Phase CDT and reports he performs all LE self care home program components  daily. .Only concern today is that compression stockings tend to fall down and feel too large at times.   Patient is accompained by: Family member   Pertinent History Hx prostate cancer 2006 w/ cyberknife XRT 08/2022;    Limitations BLE swelling, pain   Patient Stated Goals decrease leg swelling and keep it from getting worse   Currently in Pain? No/denies  Pt reports leg pain free for past 3 months.             LYMPHEDEMA/ONCOLOGY QUESTIONNAIRE - 06/04/16 0951      Left Lower Extremity Lymphedema   Other LLE A-D (ankle to below knee) limb volume=3078.69  ml, which is increased by 0.75% since last measured on 03/04/1716. LLE AD volume is decreased by 7.13% below the knee since commencing OT for CDT on 12/24/15.                   OT Treatments/Exercises (OP) - 06/04/16 0001      ADLs   ADL Education Given Yes     Manual Therapy   Manual Therapy Edema management;Other (comment)   Manual therapy comments garment assessment- completed anatomical measurements and determined Pt needs smaller size   Edema Management skin assessment- minimally dry   Manual Lymphatic Drainage (MLD) completed BLE comparative limb volumetrics  OT Education - 06/04/16 1006    Education provided Yes   Education Details Reviewed LE self care compinents, including compression, simple self MLD, skin care and ther ex. Pt is independent and 100% compliant with all. GOALS MET   Person(s) Educated Patient   Methods Explanation;Demonstration   Comprehension Verbalized understanding;Returned demonstration             OT Long Term Goals - 06/04/16 1009      OT LONG TERM GOAL #1   Title Lymphedema (LE) self-care: Pt independent with lymphedema precautions using printed handout as reference within 2 weeks to limit LE progression.   Baseline dependent   Time 2   Period Weeks   Status Achieved     OT LONG TERM GOAL #2   Title Lymphedema (LE) self-care: Pt able to apply  multi layered, gradient compression wraps with moderate caregiver assistance to wrap foot portion using proper techniques within 2 weeks to achieve optimal limb volume reduction.   Baseline dependent   Time 2   Period Weeks   Status Achieved     OT LONG TERM GOAL #3   Title Lymphedema (LE) self-care:  Pt to achieve at least 10% LLE limb volume reductions during Intensive CDT to limit LE progression, decrease infection risk, to reduce pain/ discomfort, and to improve safe ambulation and functional mobility.   Baseline dependent   Time 12   Period Weeks   Status Achieved     OT LONG TERM GOAL #4   Title Lymphedema (LE) self-care:  Pt to tolerate daily compression wraps, garments and devices in keeping w/ prescribed wear regime within 1 week of issue date to progress and retain clinical and functional gains and to limit LE progression.   Baseline dependent   Time 12   Period Weeks   Status Achieved     OT LONG TERM GOAL #5   Title During Management Phase CDT Pt to sustain current limb volumes within 5%, and all other clinical gains achieved during OT treatment with needed level of caregiver assistance to limit LE progression and further functional decline.   Baseline dependent   Time 6   Period Months   Status Achieved               Plan - 06/04/16 1013    Clinical Impression Statement Pt returns today for 3 month f/u after completing Intensive Phase Complete Decongestive Therapy for LLE lymphedema. Pt has been 100% compliant and insependent with all LE self care protocols during visit interval. Skin in in excellent condition, leg swelling is very well controlled and leg pain has resolved. Pt has resumed regular activities and exercise regime. LLE anatomical measurements reveal Pt now needs smaller size compression garments. I assisted him with on line order replacing  current Juzo, sz III, Dynamic (3512/ 30-40 mmHg) knee highs w/ sz II, 3512,. We added a silicone top band to assist  w/ keeping top edge in place. LLE comparative limb volumetrics reveAL LLE limb volume is increased by 0.075%, which is essentially stable. I believe the increase in volume is due to increased muscle mass at present, not limb swelling. Pt  agrees w/ plan to DC OT as all goals are met and lymphedema is stable and very well managed. Pt will call PRN.    Clinical Impairments Affecting Rehab Potential DM skin precautions, COPD, pain, LLE swelling, obesity, OSA   Plan DC OT for CDT today. All goals met.   Consulted and Agree with Plan  of Care Patient      Patient will benefit from skilled therapeutic intervention in order to improve the following deficits and impairments:     Visit Diagnosis: Lymphedema, not elsewhere classified      G-Codes - 06/24/2016 1011    Functional Assessment Tool Used observation, physical exam, medical Hx review, interview, comparative limb volumetrics   Functional Limitation Self care   Self Care Current Status ([id number]) 0 percent impaired, limited or restricted   Self Care Discharge Status [phone]) 0 percent impaired, limited or restricted      Problem List There are no active problems to display for this patient.   Andrey Spearman, MS, OTR/L, [facility] 06/24/16 10:24 AM  [place] MAIN [facility] SERVICES [address] [place], [place], [address] Phone: [phone]   Fax:  [phone]  Name: Christopher Pruitt MRN: [id number] Date of Birth: [birth year]

## 2016-06-04 NOTE — Patient Instructions (Signed)

## 2017-06-04 DIAGNOSIS — Z800 Family history of malignant neoplasm of digestive organs: Secondary | ICD-10-CM | POA: Insufficient documentation

## 2017-12-08 DIAGNOSIS — N1831 Chronic kidney disease, stage 3a: Secondary | ICD-10-CM | POA: Insufficient documentation

## 2017-12-08 DIAGNOSIS — E1169 Type 2 diabetes mellitus with other specified complication: Secondary | ICD-10-CM | POA: Insufficient documentation

## 2017-12-08 DIAGNOSIS — Z0000 Encounter for general adult medical examination without abnormal findings: Secondary | ICD-10-CM | POA: Insufficient documentation

## 2017-12-08 DIAGNOSIS — N183 Chronic kidney disease, stage 3 unspecified: Secondary | ICD-10-CM | POA: Insufficient documentation

## 2018-07-26 ENCOUNTER — Encounter: Payer: Self-pay | Admitting: *Deleted

## 2018-07-27 ENCOUNTER — Ambulatory Visit
Admission: RE | Admit: 2018-07-27 | Discharge: 2018-07-27 | Disposition: A | Discharge status: Home / Self Care | Payer: Medicare Other | Attending: Internal Medicine | Admitting: Internal Medicine

## 2018-07-27 ENCOUNTER — Encounter: Payer: Self-pay | Admitting: *Deleted

## 2018-07-27 ENCOUNTER — Ambulatory Visit: Payer: Medicare Other | Admitting: Anesthesiology

## 2018-07-27 ENCOUNTER — Other Ambulatory Visit: Payer: Self-pay

## 2018-07-27 ENCOUNTER — Encounter
Admission: RE | Disposition: A | Discharge status: Home / Self Care | Payer: Self-pay | Source: Home / Self Care | Attending: Internal Medicine

## 2018-07-27 DIAGNOSIS — Z888 Allergy status to other drugs, medicaments and biological substances status: Secondary | ICD-10-CM | POA: Insufficient documentation

## 2018-07-27 DIAGNOSIS — Z86010 Personal history of colonic polyps: Secondary | ICD-10-CM | POA: Insufficient documentation

## 2018-07-27 DIAGNOSIS — Z885 Allergy status to narcotic agent status: Secondary | ICD-10-CM | POA: Insufficient documentation

## 2018-07-27 DIAGNOSIS — J439 Emphysema, unspecified: Secondary | ICD-10-CM | POA: Diagnosis not present

## 2018-07-27 DIAGNOSIS — E119 Type 2 diabetes mellitus without complications: Secondary | ICD-10-CM | POA: Diagnosis not present

## 2018-07-27 DIAGNOSIS — G4733 Obstructive sleep apnea (adult) (pediatric): Secondary | ICD-10-CM | POA: Insufficient documentation

## 2018-07-27 DIAGNOSIS — Z8546 Personal history of malignant neoplasm of prostate: Secondary | ICD-10-CM | POA: Insufficient documentation

## 2018-07-27 DIAGNOSIS — Z7982 Long term (current) use of aspirin: Secondary | ICD-10-CM | POA: Diagnosis not present

## 2018-07-27 DIAGNOSIS — Z79899 Other long term (current) drug therapy: Secondary | ICD-10-CM | POA: Diagnosis not present

## 2018-07-27 DIAGNOSIS — Z79890 Hormone replacement therapy (postmenopausal): Secondary | ICD-10-CM | POA: Diagnosis not present

## 2018-07-27 DIAGNOSIS — Z880 Allergy status to penicillin: Secondary | ICD-10-CM | POA: Diagnosis not present

## 2018-07-27 DIAGNOSIS — Z7984 Long term (current) use of oral hypoglycemic drugs: Secondary | ICD-10-CM | POA: Diagnosis not present

## 2018-07-27 DIAGNOSIS — Z881 Allergy status to other antibiotic agents status: Secondary | ICD-10-CM | POA: Diagnosis not present

## 2018-07-27 DIAGNOSIS — K591 Functional diarrhea: Secondary | ICD-10-CM | POA: Diagnosis present

## 2018-07-27 DIAGNOSIS — R634 Abnormal weight loss: Secondary | ICD-10-CM | POA: Insufficient documentation

## 2018-07-27 DIAGNOSIS — Z87891 Personal history of nicotine dependence: Secondary | ICD-10-CM | POA: Diagnosis not present

## 2018-07-27 DIAGNOSIS — E039 Hypothyroidism, unspecified: Secondary | ICD-10-CM | POA: Diagnosis not present

## 2018-07-27 DIAGNOSIS — I10 Essential (primary) hypertension: Secondary | ICD-10-CM | POA: Diagnosis not present

## 2018-07-27 HISTORY — DX: Malignant (primary) neoplasm, unspecified: C80.1

## 2018-07-27 HISTORY — PX: COLONOSCOPY WITH PROPOFOL: SHX5780

## 2018-07-27 LAB — GLUCOSE, CAPILLARY: Glucose-Capillary: 101 mg/dL — ABNORMAL HIGH (ref 70–99)

## 2018-07-27 SURGERY — COLONOSCOPY WITH PROPOFOL
Anesthesia: General

## 2018-07-27 MED ORDER — GLYCOPYRROLATE 0.2 MG/ML IJ SOLN
INTRAMUSCULAR | Status: DC | PRN
  Administered 2018-07-27: 0.2 mg via INTRAVENOUS

## 2018-07-27 MED ORDER — SODIUM CHLORIDE 0.9 % IV SOLN
INTRAVENOUS | Status: DC
  Administered 2018-07-27: 09:00:00 via INTRAVENOUS

## 2018-07-27 MED ORDER — PROPOFOL 10 MG/ML IV BOLUS
INTRAVENOUS | Status: DC | PRN
  Administered 2018-07-27 (×6): 50 mg via INTRAVENOUS

## 2018-07-27 MED ORDER — EPHEDRINE SULFATE 50 MG/ML IJ SOLN
INTRAMUSCULAR | Status: DC | PRN
  Administered 2018-07-27: 5 mg via INTRAVENOUS

## 2018-07-27 NOTE — Anesthesia Post-op Follow-up Note (Signed)
Anesthesia QCDR form completed.        

## 2018-07-27 NOTE — Transfer of Care (Signed)
Immediate Anesthesia Transfer of Care Note  Patient: Christopher Pruitt  Procedure(s) Performed: COLONOSCOPY WITH PROPOFOL (N/A )  Patient Location: Endoscopy Unit  Anesthesia Type:General  Level of Consciousness: awake, alert , oriented and patient cooperative  Airway & Oxygen Therapy: Patient Spontanous Breathing and Patient connected to face mask oxygen  Post-op Assessment: Report given to RN and Post -op Vital signs reviewed and stable  Post vital signs: Reviewed and stable  Last Vitals:  Vitals Value Taken Time  BP 123/94 07/27/2018  9:11 AM  Temp 36.7 C 07/27/2018  9:11 AM  Pulse 105 07/27/2018  9:11 AM  Resp 23 07/27/2018  9:11 AM  SpO2 99 % 07/27/2018  9:11 AM  Vitals shown include unvalidated device data.  Last Pain:  Vitals:   07/27/18 0911  TempSrc: Tympanic  PainSc:          Complications: No apparent anesthesia complications

## 2018-07-27 NOTE — Anesthesia Preprocedure Evaluation (Signed)
Anesthesia Evaluation    Airway Mallampati: III  TM Distance: >3 FB     Dental  (+) Chipped   Pulmonary sleep apnea and Continuous Positive Airway Pressure Ventilation , COPD, former smoker,           Cardiovascular hypertension, Pt. on medications      Neuro/Psych    GI/Hepatic   Endo/Other  diabetes, Type 2Hypothyroidism   Renal/GU      Musculoskeletal   Abdominal   Peds  Hematology   Anesthesia Other Findings   Reproductive/Obstetrics                             Anesthesia Physical Anesthesia Plan  ASA: III  Anesthesia Plan: General   Post-op Pain Management:    Induction: Intravenous  PONV Risk Score and Plan:   Airway Management Planned:   Additional Equipment:   Intra-op Plan:   Post-operative Plan:   Informed Consent: I have reviewed the patients History and Physical, chart, labs and discussed the procedure including the risks, benefits and alternatives for the proposed anesthesia with the patient or authorized representative who has indicated his/her understanding and acceptance.       Plan Discussed with: CRNA  Anesthesia Plan Comments:         Anesthesia Quick Evaluation

## 2018-07-27 NOTE — Op Note (Signed)
[facility] Gastroenterology Patient Name: Christopher Pruitt Procedure Date: 07/27/2018 8:35 AM MRN: [id number] Account #: [id number] Date of Birth: [birth year] Admit Type: Outpatient Age: 73 Room: [facility] ENDO ROOM 3 Gender: Male Note Status: Finalized Procedure:            Colonoscopy Indications:          Functional diarrhea, Weight loss Providers:            Benay Pike. Alice Reichert MD, MD Referring MD:         Rusty Aus, MD (Referring MD) Medicines:            Propofol per Anesthesia Complications:        No immediate complications. Procedure:            Pre-Anesthesia Assessment:                       - The risks and benefits of the procedure and the                        sedation options and risks were discussed with the                        patient. All questions were answered and informed                        consent was obtained.                       - Patient identification and proposed procedure were                        verified prior to the procedure by the nurse. The                        procedure was verified in the procedure room.                       - ASA Grade Assessment: III - A patient with severe                        systemic disease.                       - After reviewing the risks and benefits, the patient                        was deemed in satisfactory condition to undergo the                        procedure.                       After obtaining informed consent, the colonoscope was                        passed under direct vision. Throughout the procedure,                        the patient's blood pressure, pulse, and oxygen  saturations were monitored continuously. The                        Colonoscope was introduced through the anus and                        advanced to the the cecum, identified by appendiceal                        orifice and ileocecal valve. The colonoscopy was    performed without difficulty. The patient tolerated the                        procedure well. The quality of the bowel preparation                        was good. The ileocecal valve, appendiceal orifice, and                        rectum were photographed. Findings:      The perianal and digital rectal examinations were normal. Pertinent       negatives include normal sphincter tone and no palpable rectal lesions.      The colon (entire examined portion) appeared normal. Biopsies for       histology were taken with a cold forceps from the random colon for       evaluation of microscopic colitis.      The exam was otherwise without abnormality on direct and retroflexion       views. Impression:           - The entire examined colon is normal. Biopsied.                       - The examination was otherwise normal on direct and                        retroflexion views. Recommendation:       - Patient has a contact number available for                        emergencies. The signs and symptoms of potential                        delayed complications were discussed with the patient.                        Return to normal activities tomorrow. Written discharge                        instructions were provided to the patient.                       - Resume previous diet.                       - Continue present medications.                       - Await pathology results.                       - No  repeat colonoscopy due to current age (59 years or                        older) and the absence of colonic polyps.                       - Return to physician assistant in 6 weeks.                       - Mason Croley, P.A. will follow with you in the office.                       - The findings and recommendations were discussed with                        the patient and their family. Procedure Code(s):    --- Professional ---                       [phone], Colonoscopy, flexible; with  biopsy, single or                        multiple Diagnosis Code(s):    --- Professional ---                       R63.4, Abnormal weight loss                       K59.1, Functional diarrhea CPT copyright 2018 American Medical Association. All rights reserved. The codes documented in this report are preliminary and upon coder review may  be revised to meet current compliance requirements. Efrain Sella MD, MD 07/27/2018 9:07:21 AM This report has been signed electronically. Number of Addenda: 0 Note Initiated On: 07/27/2018 8:35 AM Scope Withdrawal Time: 0 hours 8 minutes 40 seconds  Total Procedure Duration: 0 hours 14 minutes 36 seconds       [facility]

## 2018-07-27 NOTE — Anesthesia Postprocedure Evaluation (Signed)
Anesthesia Post Note  Patient: Christopher Pruitt  Procedure(s) Performed: COLONOSCOPY WITH PROPOFOL (N/A )  Patient location during evaluation: Endoscopy Anesthesia Type: General Level of consciousness: awake and alert Pain management: pain level controlled Vital Signs Assessment: post-procedure vital signs reviewed and stable Respiratory status: spontaneous breathing, nonlabored ventilation, respiratory function stable and patient connected to nasal cannula oxygen Cardiovascular status: blood pressure returned to baseline and stable Postop Assessment: no apparent nausea or vomiting Anesthetic complications: no     Last Vitals:  Vitals:   07/27/18 0814 07/27/18 0911  BP: 137/74   Pulse: 69   Resp: 20   Temp: 36.6 C 36.7 C  SpO2: 98%     Last Pain:  Vitals:   07/27/18 0921  TempSrc:   PainSc: 0-No pain                 Kinzi Frediani S

## 2018-07-27 NOTE — H&P (Signed)
Outpatient short stay form Pre-procedure 07/27/2018 8:07 AM Eleftherios Dudenhoeffer K. Alice Reichert, M.D.  Primary Physician: Emily Filbert, M.D.  Reason for visit: Personal hx of colon polyps, diarrhea, weight loss  History of present illness: 73 year old male with a history of type 2 diabetes on  metformin presents for diarrhea which is resolved after stopping Zoloft and beginning Lexapro.  Has personal history of polyps 1 with high grade dysplasia in 2007.  Otherwise denies any rectal bleeding.    Current Facility-Administered Medications:  .  0.9 %  sodium chloride infusion, , Intravenous, Continuous, Lulani Bour, Benay Pike, MD  Medications Prior to Admission  Medication Sig Dispense Refill Last Dose  . escitalopram (LEXAPRO) 10 MG tablet Take 10 mg by mouth daily.     . indapamide (LOZOL) 2.5 MG tablet Take 2.5 mg by mouth daily as needed.     Marland Kitchen acetaminophen (TYLENOL) 325 MG tablet Take 650 mg by mouth every 6 (six) hours as needed.   More than a month at Unknown time  . allopurinol (ZYLOPRIM) 300 MG tablet Take 300 mg by mouth every morning.   02/11/2015 at 0600  . aspirin EC 325 MG tablet Take 325 mg by mouth daily.   02/03/2015  . clindamycin (CLEOCIN) 300 MG capsule Take 2 capsules (600 mg total) by mouth 3 (three) times daily. 27 capsule 0   . glimepiride (AMARYL) 2 MG tablet Take 2-4 mg by mouth 2 (two) times daily. TAKES 2MG  AM AND 4 MG PM   02/10/2015 at Unknown time  . hydrochlorothiazide (HYDRODIURIL) 25 MG tablet Take 25 mg by mouth every morning.   02/10/2015 at Unknown time  . HYDROcodone-acetaminophen (NORCO) 5-325 MG tablet Take 1-2 tablets by mouth every 4 (four) hours as needed for moderate pain. 30 tablet 0   . HYDROcodone-acetaminophen (NORCO/VICODIN) 5-325 MG per tablet Take 1 tablet q 4-6 hours prn pain 30 tablet 0 Unknown at Unknown time  . ibuprofen (ADVIL,MOTRIN) 800 MG tablet Take 800 mg by mouth every 8 (eight) hours as needed.   More than a month at Unknown time  . levothyroxine  (SYNTHROID, LEVOTHROID) 125 MCG tablet Take 125 mcg by mouth daily before breakfast.   02/11/2015 at 0600  . lisinopril (PRINIVIL,ZESTRIL) 40 MG tablet Take 40 mg by mouth every morning.   02/10/2015 at Unknown time  . metFORMIN (GLUCOPHAGE) 1000 MG tablet Take 1,000 mg by mouth 2 (two) times daily with a meal.   02/10/2015 at Unknown time  . oxybutynin (DITROPAN) 5 MG tablet Take 5 mg by mouth 2 (two) times daily.   02/10/2015 at Unknown time     Allergies  Allergen Reactions  . Oxycodone Other (See Comments)    Confusion  . Clindamycin/Lincomycin Itching  . Amoxicillin-Pot Clavulanate Diarrhea  . Nalfon [Fenoprofen Calcium] Hives  . Robaxin [Methocarbamol] Hives     Past Medical History:  Diagnosis Date  . Cancer [facility])    prostate cancer   . COPD with emphysema ([place])   . Diabetes mellitus, type 2 ([place])   . Gout    STABLE  . History of prostate cancer   . Hypertension   . Hypothyroidism   . Left hydrocele   . Lymphedema of left leg   . OSA on CPAP   . Right wrist fracture    INJURY 02-01-2014 PT FELL  . Urge urinary incontinence   . Wears glasses   . Wears hearing aid    BILATERAL    Review of systems:  Otherwise negative.  Physical Exam  Gen: Alert, oriented. Appears stated age.  HEENT: [place]/AT. PERRLA. Lungs: CTA, no wheezes. CV: RR nl S1, S2. Abd: soft, benign, no masses. BS+ Ext: No edema. Pulses 2+    Planned procedures: Proceed with colonoscopy. The patient understands the nature of the planned procedure, indications, risks, alternatives and potential complications including but not limited to bleeding, infection, perforation, damage to internal organs and possible oversedation/side effects from anesthesia. The patient agrees and gives consent to proceed.  Please refer to procedure notes for findings, recommendations and patient disposition/instructions.     Clifford Benninger K. Alice Reichert, M.D. Gastroenterology 07/27/2018  8:07 AM

## 2018-07-28 LAB — SURGICAL PATHOLOGY

## 2018-10-21 ENCOUNTER — Other Ambulatory Visit: Payer: Self-pay

## 2018-10-21 ENCOUNTER — Encounter
Admission: RE | Admit: 2018-10-21 | Discharge: 2018-10-21 | Disposition: A | Discharge status: Home / Self Care | Payer: Medicare Other | Source: Ambulatory Visit | Attending: Surgery | Admitting: Surgery

## 2018-10-21 DIAGNOSIS — Z01818 Encounter for other preprocedural examination: Secondary | ICD-10-CM | POA: Insufficient documentation

## 2018-10-21 DIAGNOSIS — Z1159 Encounter for screening for other viral diseases: Secondary | ICD-10-CM | POA: Insufficient documentation

## 2018-10-21 DIAGNOSIS — I10 Essential (primary) hypertension: Secondary | ICD-10-CM | POA: Insufficient documentation

## 2018-10-21 HISTORY — DX: Lymphedema, not elsewhere classified: I89.0

## 2018-10-21 LAB — BASIC METABOLIC PANEL
Anion gap: 8 (ref 5–15)
BUN: 28 mg/dL — ABNORMAL HIGH (ref 8–23)
CO2: 24 mmol/L (ref 22–32)
Calcium: 9.1 mg/dL (ref 8.9–10.3)
Chloride: 106 mmol/L (ref 98–111)
Creatinine, Ser: 1.33 mg/dL — ABNORMAL HIGH (ref 0.61–1.24)
GFR calc Af Amer: >60 mL/min (ref >60)
GFR calc non Af Amer: 52 mL/min — ABNORMAL LOW (ref >60)
Glucose, Bld: 73 mg/dL (ref 70–99)
Potassium: 4.3 mmol/L (ref 3.5–5.1)
Sodium: 138 mmol/L (ref 135–145)

## 2018-10-21 LAB — CBC
HCT: 34 % — ABNORMAL LOW (ref 39.0–52.0)
Hemoglobin: 11.5 g/dL — ABNORMAL LOW (ref 13.0–17.0)
MCH: 29.9 pg (ref 26.0–34.0)
MCHC: 33.8 g/dL (ref 30.0–36.0)
MCV: 88.5 fL (ref 80.0–100.0)
Platelets: 117 10*3/uL — ABNORMAL LOW (ref 150–400)
RBC: 3.84 MIL/uL — ABNORMAL LOW (ref 4.22–5.81)
RDW: 15.5 % (ref 11.5–15.5)
WBC: 5.4 10*3/uL (ref 4.0–10.5)
nRBC: 0 % (ref 0.0–0.2)

## 2018-10-21 NOTE — Patient Instructions (Signed)
Your procedure is scheduled on: 10/25/2018 Tues Report to Same Day Surgery 2nd floor medical mall [facility] Entrance-take elevator on left to 2nd floor.  Check in with surgery information desk.) To find out your arrival time please call [phone] between 1PM - 3PM on 10/24/2018 Mon  Remember: Instructions that are not followed completely may result in serious medical risk, up to and including death, or upon the discretion of your surgeon and anesthesiologist your surgery may need to be rescheduled.    _x___ 1. Do not eat food after midnight the night before your procedure. You may drink clear liquids up to 2 hours before you are scheduled to arrive at the hospital for your procedure.  Do not drink clear liquids within 2 hours of your scheduled arrival to the hospital.  Clear liquids include  --Water or Apple juice without pulp  --Clear carbohydrate beverage such as ClearFast or Gatorade  --Black Coffee or Clear Tea (No milk, no creamers, do not add anything to                  the coffee or Tea Type 1 and type 2 diabetics should only drink water.   ____Ensure clear carbohydrate drink on the way to the hospital for bariatric patients  ____Ensure clear carbohydrate drink 3 hours before surgery for Dr Dwyane Luo patients if physician instructed.   No gum chewing or hard candies.     __x__ 2. No Alcohol for 24 hours before or after surgery.   __x__3. No Smoking or e-cigarettes for 24 prior to surgery.  Do not use any chewable tobacco products for at least 6 hour prior to surgery   ____  4. Bring all medications with you on the day of surgery if instructed.    __x__ 5. Notify your doctor if there is any change in your medical condition     (cold, fever, infections).    x___6. On the morning of surgery brush your teeth with toothpaste and water.  You may rinse your mouth with mouth wash if you wish.  Do not swallow any toothpaste or mouthwash.   Do not wear jewelry, make-up,  hairpins, clips or nail polish.  Do not wear lotions, powders, or perfumes. You may wear deodorant.  Do not shave 48 hours prior to surgery. Men may shave face and neck.  Do not bring valuables to the hospital.    [facility] is not responsible for any belongings or valuables.               Contacts, dentures or bridgework may not be worn into surgery.  Leave your suitcase in the car. After surgery it may be brought to your room.  For patients admitted to the hospital, discharge time is determined by your                       treatment team.  _  Patients discharged the day of surgery will not be allowed to drive home.  You will need someone to drive you home and stay with you the night of your procedure.    Please read over the following fact sheets that you were given:   [facility] Preparing for Surgery and or MRSA Information   _x___ Take anti-hypertensive listed below, cardiac, seizure, asthma,     anti-reflux and psychiatric medicines. These include:  1. levothyroxine (SYNTHROID, LEVOTHROID) 125 MCG tablet  2.oxybutynin (DITROPAN) 5 MG tablet  3.  4.  5.  6.  ____Fleets enema or Magnesium Citrate as directed.   _x___ Use CHG Soap or sage wipes as directed on instruction sheet   ____ Use inhalers on the day of surgery and bring to hospital day of surgery  __x__ Stop Metformin and Janumet 2 days prior to surgery.    ____ Take 1/2 of usual insulin dose the night before surgery and none on the morning     surgery.   _x___ Follow recommendations from Cardiologist, Pulmonologist or PCP regarding          stopping Aspirin, Coumadin, Plavix ,Eliquis, Effient, or Pradaxa, and Pletal.  X____Stop Anti-inflammatories such as Advil, Aleve, Ibuprofen, Motrin, Naproxen, Naprosyn, Goodies powders or aspirin products. OK to take Tylenol and                          Celebrex.   _x___ Stop supplements until after surgery.  But may continue Vitamin D, Vitamin B,       and  multivitamin.   ____ Bring C-Pap to the hospital.

## 2018-10-22 LAB — NOVEL CORONAVIRUS, NAA (HOSP ORDER, SEND-OUT TO REF LAB; TAT 18-24 HRS): SARS-CoV-2, NAA: NOT DETECTED

## 2018-10-24 NOTE — Pre-Procedure Instructions (Signed)
CBC and Met B results sent to Dr. Roland Rack and Anesthesia for review.

## 2018-10-25 ENCOUNTER — Ambulatory Visit: Payer: Medicare Other | Admitting: Certified Registered Nurse Anesthetist

## 2018-10-25 ENCOUNTER — Encounter
Admission: RE | Disposition: A | Discharge status: Home / Self Care | Payer: Self-pay | Source: Home / Self Care | Attending: Surgery

## 2018-10-25 ENCOUNTER — Ambulatory Visit
Admission: RE | Admit: 2018-10-25 | Discharge: 2018-10-25 | Disposition: A | Discharge status: Home / Self Care | Payer: Medicare Other | Attending: Surgery | Admitting: Surgery

## 2018-10-25 ENCOUNTER — Other Ambulatory Visit: Payer: Self-pay

## 2018-10-25 DIAGNOSIS — Z79899 Other long term (current) drug therapy: Secondary | ICD-10-CM | POA: Insufficient documentation

## 2018-10-25 DIAGNOSIS — Z8546 Personal history of malignant neoplasm of prostate: Secondary | ICD-10-CM | POA: Diagnosis not present

## 2018-10-25 DIAGNOSIS — E039 Hypothyroidism, unspecified: Secondary | ICD-10-CM | POA: Insufficient documentation

## 2018-10-25 DIAGNOSIS — E785 Hyperlipidemia, unspecified: Secondary | ICD-10-CM | POA: Diagnosis not present

## 2018-10-25 DIAGNOSIS — Z87891 Personal history of nicotine dependence: Secondary | ICD-10-CM | POA: Diagnosis not present

## 2018-10-25 DIAGNOSIS — Z79890 Hormone replacement therapy (postmenopausal): Secondary | ICD-10-CM | POA: Diagnosis not present

## 2018-10-25 DIAGNOSIS — M79642 Pain in left hand: Secondary | ICD-10-CM | POA: Diagnosis present

## 2018-10-25 DIAGNOSIS — E119 Type 2 diabetes mellitus without complications: Secondary | ICD-10-CM | POA: Insufficient documentation

## 2018-10-25 DIAGNOSIS — Z7984 Long term (current) use of oral hypoglycemic drugs: Secondary | ICD-10-CM | POA: Diagnosis not present

## 2018-10-25 DIAGNOSIS — G5602 Carpal tunnel syndrome, left upper limb: Secondary | ICD-10-CM | POA: Insufficient documentation

## 2018-10-25 DIAGNOSIS — J449 Chronic obstructive pulmonary disease, unspecified: Secondary | ICD-10-CM | POA: Diagnosis not present

## 2018-10-25 DIAGNOSIS — Z96651 Presence of right artificial knee joint: Secondary | ICD-10-CM | POA: Insufficient documentation

## 2018-10-25 DIAGNOSIS — G4730 Sleep apnea, unspecified: Secondary | ICD-10-CM | POA: Diagnosis not present

## 2018-10-25 HISTORY — PX: CARPAL TUNNEL RELEASE: SHX101

## 2018-10-25 LAB — GLUCOSE, CAPILLARY
Glucose-Capillary: 85 mg/dL (ref 70–99)
Glucose-Capillary: 114 mg/dL — ABNORMAL HIGH (ref 70–99)

## 2018-10-25 SURGERY — RELEASE, CARPAL TUNNEL, ENDOSCOPIC
Anesthesia: General | Laterality: Left

## 2018-10-25 MED ORDER — LIDOCAINE HCL (CARDIAC) PF 100 MG/5ML IV SOSY
PREFILLED_SYRINGE | INTRAVENOUS | Status: DC | PRN
  Administered 2018-10-25: 80 mg via INTRAVENOUS

## 2018-10-25 MED ORDER — POTASSIUM CHLORIDE IN NACL 20-0.9 MEQ/L-% IV SOLN
INTRAVENOUS | Status: DC
  Filled 2018-10-25 (×3): qty 1000

## 2018-10-25 MED ORDER — BUPIVACAINE HCL (PF) 0.5 % IJ SOLN
INTRAMUSCULAR | Status: AC
  Filled 2018-10-25: qty 30

## 2018-10-25 MED ORDER — ONDANSETRON HCL 4 MG/2ML IJ SOLN: 4.0000 mg | Freq: Four times a day (QID) | INTRAMUSCULAR | Status: DC | PRN

## 2018-10-25 MED ORDER — ONDANSETRON HCL 4 MG PO TABS: 4.0000 mg | ORAL_TABLET | Freq: Four times a day (QID) | ORAL | Status: DC | PRN

## 2018-10-25 MED ORDER — BUPIVACAINE HCL (PF) 0.5 % IJ SOLN
INTRAMUSCULAR | Status: DC | PRN
  Administered 2018-10-25: 10 mL

## 2018-10-25 MED ORDER — ONDANSETRON HCL 4 MG/2ML IJ SOLN: 4.0000 mg | Freq: Once | INTRAMUSCULAR | Status: DC | PRN

## 2018-10-25 MED ORDER — PROPOFOL 500 MG/50ML IV EMUL
INTRAVENOUS | Status: AC
  Filled 2018-10-25: qty 50

## 2018-10-25 MED ORDER — FENTANYL CITRATE (PF) 100 MCG/2ML IJ SOLN: 25.0000 ug | INTRAMUSCULAR | Status: DC | PRN

## 2018-10-25 MED ORDER — HYDROCODONE-ACETAMINOPHEN 5-325 MG PO TABS: 1.0000 | ORAL_TABLET | ORAL | Status: DC | PRN

## 2018-10-25 MED ORDER — ONDANSETRON HCL 4 MG/2ML IJ SOLN
INTRAMUSCULAR | Status: AC
  Filled 2018-10-25: qty 2

## 2018-10-25 MED ORDER — FAMOTIDINE 20 MG PO TABS
20.0000 mg | ORAL_TABLET | Freq: Once | ORAL | Status: AC
  Administered 2018-10-25: 20 mg via ORAL

## 2018-10-25 MED ORDER — PROPOFOL 10 MG/ML IV BOLUS
INTRAVENOUS | Status: AC
  Filled 2018-10-25: qty 20

## 2018-10-25 MED ORDER — VANCOMYCIN HCL IN DEXTROSE 1-5 GM/200ML-% IV SOLN
INTRAVENOUS | Status: AC
  Administered 2018-10-25: 13:00:00 1000 mg via INTRAVENOUS
  Filled 2018-10-25: qty 200

## 2018-10-25 MED ORDER — FENTANYL CITRATE (PF) 100 MCG/2ML IJ SOLN
INTRAMUSCULAR | Status: AC
  Filled 2018-10-25: qty 2

## 2018-10-25 MED ORDER — FAMOTIDINE 20 MG PO TABS
ORAL_TABLET | ORAL | Status: AC
  Filled 2018-10-25: qty 1

## 2018-10-25 MED ORDER — ONDANSETRON HCL 4 MG/2ML IJ SOLN
INTRAMUSCULAR | Status: DC | PRN
  Administered 2018-10-25: 4 mg via INTRAVENOUS

## 2018-10-25 MED ORDER — SODIUM CHLORIDE 0.9 % IV SOLN
INTRAVENOUS | Status: DC
  Administered 2018-10-25: 12:00:00 via INTRAVENOUS

## 2018-10-25 MED ORDER — DEXAMETHASONE SODIUM PHOSPHATE 10 MG/ML IJ SOLN
INTRAMUSCULAR | Status: AC
  Filled 2018-10-25: qty 1

## 2018-10-25 MED ORDER — FENTANYL CITRATE (PF) 100 MCG/2ML IJ SOLN
INTRAMUSCULAR | Status: DC | PRN
  Administered 2018-10-25 (×2): 50 ug via INTRAVENOUS

## 2018-10-25 MED ORDER — HYDROCODONE-ACETAMINOPHEN 5-325 MG PO TABS: ORAL_TABLET | ORAL | 0 refills | Status: DC

## 2018-10-25 MED ORDER — METOCLOPRAMIDE HCL 10 MG PO TABS: 5.0000 mg | ORAL_TABLET | Freq: Three times a day (TID) | ORAL | Status: DC | PRN

## 2018-10-25 MED ORDER — VANCOMYCIN HCL IN DEXTROSE 1-5 GM/200ML-% IV SOLN
1000.0000 mg | Freq: Once | INTRAVENOUS | Status: AC
  Administered 2018-10-25: 1000 mg via INTRAVENOUS

## 2018-10-25 MED ORDER — EPHEDRINE SULFATE 50 MG/ML IJ SOLN
INTRAMUSCULAR | Status: DC | PRN
  Administered 2018-10-25 (×2): 5 mg via INTRAVENOUS

## 2018-10-25 MED ORDER — METOCLOPRAMIDE HCL 5 MG/ML IJ SOLN: 5.0000 mg | Freq: Three times a day (TID) | INTRAMUSCULAR | Status: DC | PRN

## 2018-10-25 MED ORDER — DEXAMETHASONE SODIUM PHOSPHATE 10 MG/ML IJ SOLN
INTRAMUSCULAR | Status: DC | PRN
  Administered 2018-10-25: 4 mg via INTRAVENOUS

## 2018-10-25 MED ORDER — LIDOCAINE HCL (PF) 2 % IJ SOLN
INTRAMUSCULAR | Status: AC
  Filled 2018-10-25: qty 10

## 2018-10-25 MED ORDER — PROPOFOL 10 MG/ML IV BOLUS
INTRAVENOUS | Status: DC | PRN
  Administered 2018-10-25: 120 mg via INTRAVENOUS

## 2018-10-25 SURGICAL SUPPLY — 28 items
BNDG COHESIVE 4X5 TAN STRL (GAUZE/BANDAGES/DRESSINGS) ×2 IMPLANT
BNDG ELASTIC 2X5.8 VLCR STR LF (GAUZE/BANDAGES/DRESSINGS) ×2 IMPLANT
BNDG ESMARK 4X12 TAN STRL LF (GAUZE/BANDAGES/DRESSINGS) ×2 IMPLANT
CANISTER SUCT 1200ML W/VALVE (MISCELLANEOUS) ×2 IMPLANT
CHLORAPREP W/TINT 26 (MISCELLANEOUS) ×2 IMPLANT
CORD BIP STRL DISP 12FT (MISCELLANEOUS) ×2 IMPLANT
COVER WAND RF STERILE (DRAPES) ×2 IMPLANT
CUFF TOURN 18 STER (MISCELLANEOUS) IMPLANT
DRAPE SURG 17X11 SM STRL (DRAPES) ×2 IMPLANT
FORCEPS JEWEL BIP 4-3/4 STR (INSTRUMENTS) ×2 IMPLANT
GAUZE SPONGE 4X4 12PLY STRL (GAUZE/BANDAGES/DRESSINGS) ×2 IMPLANT
GAUZE XEROFORM 1X8 LF (GAUZE/BANDAGES/DRESSINGS) ×2 IMPLANT
GLOVE BIO SURGEON STRL SZ8 (GLOVE) ×2 IMPLANT
GLOVE INDICATOR 8.0 STRL GRN (GLOVE) ×2 IMPLANT
GOWN STRL REUS W/ TWL LRG LVL3 (GOWN DISPOSABLE) ×1 IMPLANT
GOWN STRL REUS W/ TWL XL LVL3 (GOWN DISPOSABLE) ×1 IMPLANT
GOWN STRL REUS W/TWL LRG LVL3 (GOWN DISPOSABLE) ×1
GOWN STRL REUS W/TWL XL LVL3 (GOWN DISPOSABLE) ×1
KIT CARPAL TUNNEL (MISCELLANEOUS) ×1
KIT ESCP INSRT D SLOT CANN KN (MISCELLANEOUS) ×1 IMPLANT
KIT TURNOVER KIT A (KITS) ×2 IMPLANT
NS IRRIG 500ML POUR BTL (IV SOLUTION) ×2 IMPLANT
PACK EXTREMITY ARMC (MISCELLANEOUS) ×2 IMPLANT
SPLINT WRIST LG LT TX990309 (SOFTGOODS) ×1 IMPLANT
SPLINT WRIST M LT TX990308 (SOFTGOODS) IMPLANT
SPLINT WRIST M RT TX990303 (SOFTGOODS) IMPLANT
STOCKINETTE IMPERVIOUS 9X36 MD (GAUZE/BANDAGES/DRESSINGS) ×2 IMPLANT
SUT PROLENE 4 0 PS 2 18 (SUTURE) ×2 IMPLANT

## 2018-10-25 NOTE — Op Note (Signed)
10/25/2018  1:43 PM  Patient:   Christopher Pruitt  Pre-Op Diagnosis:   Left carpal tunnel syndrome.  Post-Op Diagnosis:   Same.  Procedure:   Endoscopic left carpal tunnel release.  Surgeon:   Pascal Lux, MD  Anesthesia:   Bier block  Findings:   As above.  Complications:   None  EBL:   0 cc  Fluids:   200 cc crystalloid  TT:   16 minutes at 250 mmHg  Drains:   None  Closure:   4-0 Prolene interrupted sutures  Brief Clinical Note:   The patient is a a 73 year old male with a several year history of intermittent left hand and wrist pain and paresthesias. His symptoms have worsened over the past several months despite medications, activity modification, splinting, etc. His history and examination are consistent with carpal tunnel syndrome. The patient presents at this time for an endoscopic left carpal tunnel release.   Procedure:   The patient was brought into the operating room and lain in the supine position. After adequate IV sedation was achieved, a timeout was performed to verify the appropriate surgical site before a Bier block was placed by the anesthesiologist and the tourniquet inflated to 250 mmHg. The left hand and upper extremity were prepped with ChloraPrep solution before being draped sterilely. Preoperative antibiotics were administered. An approximately 1.5-2 cm incision was made over the volar wrist flexion crease, centered over the palmaris longus tendon. The incision was carried down through the subcutaneous tissues with care taken to identify and protect any neurovascular structures. The distal forearm fascia was penetrated just proximal to the transverse carpal ligament. The soft tissues were released off the superficial and deep surfaces of the distal forearm fascia and this was released proximally for 3-4 cm under direct visualization.  Attention was directed distally. The Soil scientist was passed beneath the transverse carpal ligament along the ulnar aspect  of the carpal tunnel and used to release any adhesions as well as to remove any adherent synovial tissue before first the smaller then the larger of the two dilators were passed beneath the transverse carpal ligament along the ulnar margin of the carpal tunnel. The slotted cannula was introduced and the endoscope was placed into the slotted cannula and the undersurface of the transverse carpal ligament visualized. The distal margin of the transverse carpal ligament was marked by placing a 25-gauge needle percutaneously at [place] cardinal point so that it entered the distal portion of the slotted cannula. Under endoscopic visualization, the transverse carpal ligament was released from proximal to distal using the end-cutting blade. A second pass was performed to ensure complete release of the ligament. The adequacy of release was verified both endoscopically and by palpation using the freer elevator.  The wound was irrigated thoroughly with sterile saline solution before being closed using 4-0 Prolene interrupted sutures. A total of 10 cc of 0.5% plain Sensorcaine was injected in and around the incision before a sterile bulky dressing was applied to the wound. The patient was placed into a volar wrist splint before being awakened and returned to the recovery room in satisfactory condition after tolerating the procedure well.

## 2018-10-25 NOTE — Anesthesia Preprocedure Evaluation (Signed)
Anesthesia Evaluation  Patient identified by MRN, date of birth, ID band Patient awake    Reviewed: Allergy & Precautions, H&P , NPO status , Patient's Chart, lab work & pertinent test results, reviewed documented beta blocker date and time   Airway Mallampati: II  TM Distance: >3 FB Neck ROM: full    Dental  (+) Teeth Intact   Pulmonary sleep apnea , COPD, former smoker,    Pulmonary exam normal        Cardiovascular Exercise Tolerance: Good hypertension, On Medications negative cardio ROS Normal cardiovascular exam Rate:Normal     Neuro/Psych negative neurological ROS  negative psych ROS   GI/Hepatic negative GI ROS, Neg liver ROS,   Endo/Other  diabetes, Well Controlled, Type 2Hypothyroidism   Renal/GU negative Renal ROS  negative genitourinary   Musculoskeletal   Abdominal   Peds  Hematology negative hematology ROS (+)   Anesthesia Other Findings   Reproductive/Obstetrics negative OB ROS                             Anesthesia Physical Anesthesia Plan  ASA: III  Anesthesia Plan: General LMA   Post-op Pain Management:    Induction:   PONV Risk Score and Plan:   Airway Management Planned:   Additional Equipment:   Intra-op Plan:   Post-operative Plan:   Informed Consent: I have reviewed the patients History and Physical, chart, labs and discussed the procedure including the risks, benefits and alternatives for the proposed anesthesia with the patient or authorized representative who has indicated his/her understanding and acceptance.       Plan Discussed with: CRNA  Anesthesia Plan Comments:         Anesthesia Quick Evaluation

## 2018-10-25 NOTE — Anesthesia Procedure Notes (Signed)
Procedure Name: LMA Insertion Date/Time: 10/25/2018 1:00 PM Performed by: Hedda Slade, CRNA Pre-anesthesia Checklist: Patient identified, Patient being monitored, Timeout performed, Emergency Drugs available and Suction available Patient Re-evaluated:Patient Re-evaluated prior to induction Oxygen Delivery Method: Circle system utilized Preoxygenation: Pre-oxygenation with 100% oxygen Induction Type: IV induction Ventilation: Mask ventilation without difficulty LMA: LMA inserted LMA Size: 4.5 Tube type: Oral Number of attempts: 1 Placement Confirmation: positive ETCO2 and breath sounds checked- equal and bilateral Tube secured with: Tape Dental Injury: Teeth and Oropharynx as per pre-operative assessment

## 2018-10-25 NOTE — Anesthesia Post-op Follow-up Note (Signed)
Anesthesia QCDR form completed.        

## 2018-10-25 NOTE — H&P (Signed)
Paper H&P to be scanned into permanent record. H&P reviewed and patient re-examined. No changes. 

## 2018-10-25 NOTE — Transfer of Care (Signed)
Immediate Anesthesia Transfer of Care Note  Patient: Christopher Pruitt  Procedure(s) Performed: CARPAL TUNNEL RELEASE ENDOSCOPIC LEFT, DIABETIC, SLEEPAPNEA (Left )  Patient Location: PACU  Anesthesia Type:General  Level of Consciousness: sedated  Airway & Oxygen Therapy: Patient Spontanous Breathing and Patient connected to face mask oxygen  Post-op Assessment: Report given to RN and Post -op Vital signs reviewed and stable  Post vital signs: Reviewed and stable  Last Vitals:  Vitals Value Taken Time  BP 125/65 10/25/18 1340  Temp 37.1 C 10/25/18 1340  Pulse 76 10/25/18 1345  Resp 15 10/25/18 1345  SpO2 97 % 10/25/18 1345  Vitals shown include unvalidated device data.  Last Pain:  Vitals:   10/25/18 1340  TempSrc:   PainSc: 0-No pain         Complications: No apparent anesthesia complications

## 2018-10-25 NOTE — Discharge Instructions (Addendum)
Orthopedic discharge instructions: Keep dressing dry and intact. Keep hand elevated above heart level. May shower after dressing removed on postop day 4 (Saturday). Cover sutures with Band-Aids after drying off. Apply ice to affected area frequently. Take ibuprofen 800 mg TID with meals for 7-10 days, then as necessary. Take ES Tylenol when needed.  Return for follow-up in 10-14 days or as scheduled.  AMBULATORY SURGERY  DISCHARGE INSTRUCTIONS   1) The drugs that you were given will stay in your system until tomorrow so for the next 24 hours you should not:  A) Drive an automobile B) Make any legal decisions C) Drink any alcoholic beverage   2) You may resume regular meals tomorrow.  Today it is better to start with liquids and gradually work up to solid foods.  You may eat anything you prefer, but it is better to start with liquids, then soup and crackers, and gradually work up to solid foods.   3) Please notify your doctor immediately if you have any unusual bleeding, trouble breathing, redness and pain at the surgery site, drainage, fever, or pain not relieved by medication.    4) Additional Instructions:        Please contact your physician with any problems or Same Day Surgery at [phone], Monday through Friday 6 am to 4 pm, or [place] at [facility] number at [phone].

## 2018-10-27 NOTE — Anesthesia Postprocedure Evaluation (Signed)
Anesthesia Post Note  Patient: Christopher Pruitt  Procedure(s) Performed: CARPAL TUNNEL RELEASE ENDOSCOPIC LEFT, DIABETIC, SLEEPAPNEA (Left )  Patient location during evaluation: PACU Anesthesia Type: General Level of consciousness: awake and alert Pain management: pain level controlled Vital Signs Assessment: post-procedure vital signs reviewed and stable Respiratory status: spontaneous breathing, nonlabored ventilation, respiratory function stable and patient connected to nasal cannula oxygen Cardiovascular status: blood pressure returned to baseline and stable Postop Assessment: no apparent nausea or vomiting Anesthetic complications: no     Last Vitals:  Vitals:   10/25/18 1409 10/25/18 1424  BP: 129/80 131/69  Pulse: 73 66  Resp: 13 16  Temp: (!) 36.3 C   SpO2: 96% 96%    Last Pain:  Vitals:   10/25/18 1424  TempSrc:   PainSc: 0-No pain                 Molli Barrows

## 2018-12-28 DIAGNOSIS — E782 Mixed hyperlipidemia: Secondary | ICD-10-CM | POA: Insufficient documentation

## 2018-12-28 DIAGNOSIS — D696 Thrombocytopenia, unspecified: Secondary | ICD-10-CM | POA: Insufficient documentation

## 2019-01-26 ENCOUNTER — Other Ambulatory Visit: Payer: Self-pay

## 2019-01-26 ENCOUNTER — Encounter
Admission: RE | Admit: 2019-01-26 | Discharge: 2019-01-26 | Disposition: A | Discharge status: Home / Self Care | Payer: Medicare Other | Source: Ambulatory Visit | Attending: Surgery | Admitting: Surgery

## 2019-01-26 DIAGNOSIS — Z01812 Encounter for preprocedural laboratory examination: Secondary | ICD-10-CM | POA: Insufficient documentation

## 2019-01-26 DIAGNOSIS — Z20828 Contact with and (suspected) exposure to other viral communicable diseases: Secondary | ICD-10-CM | POA: Insufficient documentation

## 2019-01-26 HISTORY — DX: Hyperlipidemia, unspecified: E78.5

## 2019-01-26 NOTE — Patient Instructions (Signed)
Your procedure is scheduled on: 02/01/2019 Wed Report to Same Day Surgery 2nd floor medical mall [facility] Entrance-take elevator on left to 2nd floor.  Check in with surgery information desk.) To find out your arrival time please call [phone] between 1PM - 3PM on 01/31/2019 Tues  Remember: Instructions that are not followed completely may result in serious medical risk, up to and including death, or upon the discretion of your surgeon and anesthesiologist your surgery may need to be rescheduled.    _x___ 1. Do not eat food after midnight the night before your procedure. You may drink clear liquids up to 2 hours before you are scheduled to arrive at the hospital for your procedure.  Do not drink clear liquids within 2 hours of your scheduled arrival to the hospital.  Clear liquids include  --Water or Apple juice without pulp  --Clear carbohydrate beverage such as ClearFast or Gatorade  --Black Coffee or Clear Tea (No milk, no creamers, do not add anything to                  the coffee or Tea Type 1 and type 2 diabetics should only drink water.   ____Ensure clear carbohydrate drink on the way to the hospital for bariatric patients  ____Ensure clear carbohydrate drink 3 hours before surgery.   No gum chewing or hard candies.     __x__ 2. No Alcohol for 24 hours before or after surgery.   __x__3. No Smoking or e-cigarettes for 24 prior to surgery.  Do not use any chewable tobacco products for at least 6 hour prior to surgery   ____  4. Bring all medications with you on the day of surgery if instructed.    __x__ 5. Notify your doctor if there is any change in your medical condition     (cold, fever, infections).    x___6. On the morning of surgery brush your teeth with toothpaste and water.  You may rinse your mouth with mouth wash if you wish.  Do not swallow any toothpaste or mouthwash.   Do not wear jewelry, make-up, hairpins, clips or nail polish.  Do not wear lotions,  powders, or perfumes. You may wear deodorant.  Do not shave 48 hours prior to surgery. Men may shave face and neck.  Do not bring valuables to the hospital.    [facility] is not responsible for any belongings or valuables.               Contacts, dentures or bridgework may not be worn into surgery.  Leave your suitcase in the car. After surgery it may be brought to your room.  For patients admitted to the hospital, discharge time is determined by your                       treatment team.  _  Patients discharged the day of surgery will not be allowed to drive home.  You will need someone to drive you home and stay with you the night of your procedure.    Please read over the following fact sheets that you were given:   [facility] Preparing for Surgery and or MRSA Information   _x___ Take anti-hypertensive listed below, cardiac, seizure, asthma,     anti-reflux and psychiatric medicines. These include:  1. allopurinol (ZYLOPRIM) 300 MG tablet  2.levothyroxine (SYNTHROID, LEVOTHROID) 125 MCG tablet  3.oxybutynin (DITROPAN) 5 MG tablet  4.  5.  6.  ____Fleets  enema or Magnesium Citrate as directed.   _x___ Use CHG Soap or sage wipes as directed on instruction sheet   ____ Use inhalers on the day of surgery and bring to hospital day of surgery  __x__ Stop Metformin and Janumet 2 days prior to surgery.    ____ Take 1/2 of usual insulin dose the night before surgery and none on the morning     surgery.   _x___ Follow recommendations from Cardiologist, Pulmonologist or PCP regarding          stopping Aspirin, Coumadin, Plavix ,Eliquis, Effient, or Pradaxa, and Pletal.  X____Stop Anti-inflammatories such as Advil, Aleve, Ibuprofen, Motrin, Naproxen, Naprosyn, Goodies powders or aspirin products. OK to take Tylenol and                          Celebrex.   _x___ Stop supplements until after surgery.  But may continue Vitamin D, Vitamin B,       and multivitamin.   ____ Bring C-Pap to  the hospital.

## 2019-01-27 ENCOUNTER — Other Ambulatory Visit: Payer: Self-pay

## 2019-01-27 ENCOUNTER — Other Ambulatory Visit
Admission: RE | Admit: 2019-01-27 | Discharge: 2019-01-27 | Disposition: A | Discharge status: Home / Self Care | Payer: Medicare Other | Source: Ambulatory Visit | Attending: Surgery | Admitting: Surgery

## 2019-01-27 DIAGNOSIS — Z01812 Encounter for preprocedural laboratory examination: Secondary | ICD-10-CM | POA: Diagnosis present

## 2019-01-27 DIAGNOSIS — Z20828 Contact with and (suspected) exposure to other viral communicable diseases: Secondary | ICD-10-CM | POA: Diagnosis not present

## 2019-01-27 LAB — SARS CORONAVIRUS 2 (TAT 6-24 HRS): SARS Coronavirus 2: NEGATIVE

## 2019-02-01 ENCOUNTER — Ambulatory Visit: Payer: Medicare Other | Admitting: Anesthesiology

## 2019-02-01 ENCOUNTER — Other Ambulatory Visit: Payer: Self-pay

## 2019-02-01 ENCOUNTER — Encounter
Admission: RE | Disposition: A | Discharge status: Home / Self Care | Payer: Self-pay | Source: Home / Self Care | Attending: Surgery

## 2019-02-01 ENCOUNTER — Ambulatory Visit
Admission: RE | Admit: 2019-02-01 | Discharge: 2019-02-01 | Disposition: A | Discharge status: Home / Self Care | Payer: Medicare Other | Attending: Surgery | Admitting: Surgery

## 2019-02-01 DIAGNOSIS — G4733 Obstructive sleep apnea (adult) (pediatric): Secondary | ICD-10-CM | POA: Insufficient documentation

## 2019-02-01 DIAGNOSIS — E669 Obesity, unspecified: Secondary | ICD-10-CM | POA: Diagnosis not present

## 2019-02-01 DIAGNOSIS — G5601 Carpal tunnel syndrome, right upper limb: Secondary | ICD-10-CM | POA: Diagnosis present

## 2019-02-01 DIAGNOSIS — Z6830 Body mass index (BMI) 30.0-30.9, adult: Secondary | ICD-10-CM | POA: Insufficient documentation

## 2019-02-01 DIAGNOSIS — I10 Essential (primary) hypertension: Secondary | ICD-10-CM | POA: Diagnosis not present

## 2019-02-01 DIAGNOSIS — Z8546 Personal history of malignant neoplasm of prostate: Secondary | ICD-10-CM | POA: Insufficient documentation

## 2019-02-01 DIAGNOSIS — Z79899 Other long term (current) drug therapy: Secondary | ICD-10-CM | POA: Insufficient documentation

## 2019-02-01 DIAGNOSIS — Z87891 Personal history of nicotine dependence: Secondary | ICD-10-CM | POA: Diagnosis not present

## 2019-02-01 DIAGNOSIS — Z79890 Hormone replacement therapy (postmenopausal): Secondary | ICD-10-CM | POA: Insufficient documentation

## 2019-02-01 HISTORY — PX: CARPAL TUNNEL RELEASE: SHX101

## 2019-02-01 LAB — GLUCOSE, CAPILLARY
Glucose-Capillary: 129 mg/dL — ABNORMAL HIGH (ref 70–99)
Glucose-Capillary: 114 mg/dL — ABNORMAL HIGH (ref 70–99)

## 2019-02-01 SURGERY — RELEASE, CARPAL TUNNEL, ENDOSCOPIC
Anesthesia: General | Site: Wrist | Laterality: Right

## 2019-02-01 MED ORDER — PHENYLEPHRINE HCL (PRESSORS) 10 MG/ML IV SOLN
INTRAVENOUS | Status: DC | PRN
  Administered 2019-02-01: 100 ug via INTRAVENOUS

## 2019-02-01 MED ORDER — MIDAZOLAM HCL 2 MG/2ML IJ SOLN
INTRAMUSCULAR | Status: DC | PRN
  Administered 2019-02-01: 1 mg via INTRAVENOUS

## 2019-02-01 MED ORDER — DEXAMETHASONE SODIUM PHOSPHATE 10 MG/ML IJ SOLN
INTRAMUSCULAR | Status: DC | PRN
  Administered 2019-02-01: 10 mg via INTRAVENOUS

## 2019-02-01 MED ORDER — PROPOFOL 10 MG/ML IV BOLUS
INTRAVENOUS | Status: AC
  Filled 2019-02-01: qty 20

## 2019-02-01 MED ORDER — SODIUM CHLORIDE 0.9 % IV SOLN
INTRAVENOUS | Status: DC
  Administered 2019-02-01: 07:00:00 via INTRAVENOUS

## 2019-02-01 MED ORDER — FENTANYL CITRATE (PF) 100 MCG/2ML IJ SOLN
INTRAMUSCULAR | Status: AC
  Filled 2019-02-01: qty 2

## 2019-02-01 MED ORDER — FENTANYL CITRATE (PF) 100 MCG/2ML IJ SOLN
INTRAMUSCULAR | Status: DC | PRN
  Administered 2019-02-01 (×4): 25 ug via INTRAVENOUS

## 2019-02-01 MED ORDER — EPHEDRINE SULFATE 50 MG/ML IJ SOLN
INTRAMUSCULAR | Status: DC | PRN
  Administered 2019-02-01: 5 mg via INTRAVENOUS
  Administered 2019-02-01: 10 mg via INTRAVENOUS

## 2019-02-01 MED ORDER — LACTATED RINGERS IV SOLN: INTRAVENOUS | Status: DC

## 2019-02-01 MED ORDER — ONDANSETRON HCL 4 MG/2ML IJ SOLN
INTRAMUSCULAR | Status: DC | PRN
  Administered 2019-02-01: 4 mg via INTRAVENOUS

## 2019-02-01 MED ORDER — FAMOTIDINE 20 MG PO TABS
ORAL_TABLET | ORAL | Status: AC
  Administered 2019-02-01: 20 mg via ORAL
  Filled 2019-02-01: qty 1

## 2019-02-01 MED ORDER — LIDOCAINE HCL (PF) 2 % IJ SOLN
INTRAMUSCULAR | Status: AC
  Filled 2019-02-01: qty 10

## 2019-02-01 MED ORDER — FENTANYL CITRATE (PF) 100 MCG/2ML IJ SOLN: 25.0000 ug | INTRAMUSCULAR | Status: DC | PRN

## 2019-02-01 MED ORDER — PROPOFOL 10 MG/ML IV BOLUS
INTRAVENOUS | Status: DC | PRN
  Administered 2019-02-01: 140 mg via INTRAVENOUS

## 2019-02-01 MED ORDER — DEXAMETHASONE SODIUM PHOSPHATE 10 MG/ML IJ SOLN
INTRAMUSCULAR | Status: AC
  Filled 2019-02-01: qty 1

## 2019-02-01 MED ORDER — MIDAZOLAM HCL 2 MG/2ML IJ SOLN
INTRAMUSCULAR | Status: AC
  Filled 2019-02-01: qty 2

## 2019-02-01 MED ORDER — FAMOTIDINE 20 MG PO TABS
20.0000 mg | ORAL_TABLET | Freq: Once | ORAL | Status: AC
  Administered 2019-02-01: 06:00:00 20 mg via ORAL

## 2019-02-01 MED ORDER — BUPIVACAINE HCL (PF) 0.5 % IJ SOLN
INTRAMUSCULAR | Status: AC
  Filled 2019-02-01: qty 30

## 2019-02-01 MED ORDER — PHENYLEPHRINE HCL (PRESSORS) 10 MG/ML IV SOLN
INTRAVENOUS | Status: AC
  Filled 2019-02-01: qty 1

## 2019-02-01 MED ORDER — LIDOCAINE HCL (CARDIAC) PF 100 MG/5ML IV SOSY
PREFILLED_SYRINGE | INTRAVENOUS | Status: DC | PRN
  Administered 2019-02-01: 100 mg via INTRAVENOUS

## 2019-02-01 MED ORDER — ONDANSETRON HCL 4 MG/2ML IJ SOLN
INTRAMUSCULAR | Status: AC
  Filled 2019-02-01: qty 2

## 2019-02-01 MED ORDER — EPHEDRINE SULFATE 50 MG/ML IJ SOLN
INTRAMUSCULAR | Status: AC
  Filled 2019-02-01: qty 1

## 2019-02-01 MED ORDER — ONDANSETRON HCL 4 MG/2ML IJ SOLN: 4.0000 mg | Freq: Once | INTRAMUSCULAR | Status: DC | PRN

## 2019-02-01 MED ORDER — BUPIVACAINE HCL (PF) 0.5 % IJ SOLN
INTRAMUSCULAR | Status: DC | PRN
  Administered 2019-02-01: 10 mL

## 2019-02-01 SURGICAL SUPPLY — 29 items
BNDG COHESIVE 4X5 TAN STRL (GAUZE/BANDAGES/DRESSINGS) ×3 IMPLANT
BNDG ELASTIC 2X5.8 VLCR STR LF (GAUZE/BANDAGES/DRESSINGS) ×3 IMPLANT
BNDG ESMARK 4X12 TAN STRL LF (GAUZE/BANDAGES/DRESSINGS) ×3 IMPLANT
CANISTER SUCT 1200ML W/VALVE (MISCELLANEOUS) ×3 IMPLANT
CHLORAPREP W/TINT 26 (MISCELLANEOUS) ×3 IMPLANT
CORD BIP STRL DISP 12FT (MISCELLANEOUS) ×3 IMPLANT
COVER WAND RF STERILE (DRAPES) ×3 IMPLANT
CUFF TOURN 18 STER (MISCELLANEOUS) ×3 IMPLANT
DRAPE SURG 17X11 SM STRL (DRAPES) ×3 IMPLANT
FORCEPS JEWEL BIP 4-3/4 STR (INSTRUMENTS) ×3 IMPLANT
GAUZE SPONGE 4X4 12PLY STRL (GAUZE/BANDAGES/DRESSINGS) ×3 IMPLANT
GAUZE XEROFORM 1X8 LF (GAUZE/BANDAGES/DRESSINGS) ×3 IMPLANT
GLOVE BIO SURGEON STRL SZ8 (GLOVE) ×3 IMPLANT
GLOVE INDICATOR 8.0 STRL GRN (GLOVE) ×3 IMPLANT
GOWN STRL REUS W/ TWL LRG LVL3 (GOWN DISPOSABLE) ×2 IMPLANT
GOWN STRL REUS W/ TWL XL LVL3 (GOWN DISPOSABLE) ×1 IMPLANT
GOWN STRL REUS W/TWL LRG LVL3 (GOWN DISPOSABLE) ×4
GOWN STRL REUS W/TWL XL LVL3 (GOWN DISPOSABLE) ×2
KIT CARPAL TUNNEL (MISCELLANEOUS) ×2
KIT ESCP INSRT D SLOT CANN KN (MISCELLANEOUS) ×1 IMPLANT
KIT TURNOVER KIT A (KITS) ×3 IMPLANT
NS IRRIG 500ML POUR BTL (IV SOLUTION) ×3 IMPLANT
PACK EXTREMITY ARMC (MISCELLANEOUS) ×3 IMPLANT
SPLINT WRIST LG LT TX990309 (SOFTGOODS) IMPLANT
SPLINT WRIST LG RT TX900304 (SOFTGOODS) IMPLANT
SPLINT WRIST M LT TX990308 (SOFTGOODS) ×3 IMPLANT
SPLINT WRIST M RT TX990303 (SOFTGOODS) IMPLANT
STOCKINETTE IMPERVIOUS 9X36 MD (GAUZE/BANDAGES/DRESSINGS) ×3 IMPLANT
SUT PROLENE 4 0 PS 2 18 (SUTURE) ×3 IMPLANT

## 2019-02-01 NOTE — H&P (Signed)
Paper H&P to be scanned into permanent record. H&P reviewed and patient re-examined. No changes. 

## 2019-02-01 NOTE — Discharge Instructions (Addendum)
AMBULATORY SURGERY  DISCHARGE INSTRUCTIONS   1) The drugs that you were given will stay in your system until tomorrow so for the next 24 hours you should not:  A) Drive an automobile B) Make any legal decisions C) Drink any alcoholic beverage   2) You may resume regular meals tomorrow.  Today it is better to start with liquids and gradually work up to solid foods.  You may eat anything you prefer, but it is better to start with liquids, then soup and crackers, and gradually work up to solid foods.   3) Please notify your doctor immediately if you have any unusual bleeding, trouble breathing, redness and pain at the surgery site, drainage, fever, or pain not relieved by medication.    4) Additional Instructions: Patient states he already has postop appt        Please contact your physician with any problems or Same Day Surgery at [phone], Monday through Friday 6 am to 4 pm, or [place] at [facility] number at [phone].Orthopedic discharge instructions: Keep dressing dry and intact. Keep hand elevated above heart level. May shower after dressing removed on postop day 4 (Sunday). Cover sutures with Band-Aids after drying off. Apply ice to affected area frequently. Take ibuprofen 800 mg TID with meals for 7-10 days, then as necessary. Take ES Tylenol when needed.  Return for follow-up in 10-14 days or as scheduled.

## 2019-02-01 NOTE — Op Note (Signed)
02/01/2019  8:19 AM  Patient:   Christopher Pruitt  Pre-Op Diagnosis:   Right carpal tunnel syndrome.  Post-Op Diagnosis:   Same.  Procedure:   Endoscopic right carpal tunnel release.  Surgeon:   Pascal Lux, MD  Anesthesia:   General LMA  Findings:   As above.  Complications:   None  EBL:   400 cc  Fluids:   0 cc crystalloid  TT:   15 minutes at 250 mmHg  Drains:   None  Closure:   4-0 Prolene interrupted sutures  Brief Clinical Note:   The patient is a 73 year old male with a history of pain and paresthesias to his right hand. His symptoms have persisted despite medications, activity modification, splinting, etc. His history and examination are consistent with carpal tunnel syndrome. The patient recently underwent an endoscopic left carpal tunnel release, from which he has done quite well. He presents at this time for an endoscopic right carpal tunnel release.   Procedure:   The patient was brought into the operating room and lain in the supine position. After adequate IV sedation was achieved, a timeout was performed to verify the appropriate surgical site before a Bier block was placed by the anesthesiologist and the tourniquet inflated to 250 mmHg. The right hand and upper extremity were prepped with ChloraPrep solution before being draped sterilely. Preoperative antibiotics were administered. An approximately 1.5-2 cm incision was made over the volar wrist flexion crease, centered over the palmaris longus tendon. The incision was carried down through the subcutaneous tissues with care taken to identify and protect any neurovascular structures. The distal forearm fascia was penetrated just proximal to the transverse carpal ligament. The soft tissues were released off the superficial and deep surfaces of the distal forearm fascia and this was released proximally for 3-4 cm under direct visualization.  Attention was directed distally. The Soil scientist was passed beneath the  transverse carpal ligament along the ulnar aspect of the carpal tunnel and used to release any adhesions as well as to remove any adherent synovial tissue before first the smaller then the larger of the two dilators were passed beneath the transverse carpal ligament along the ulnar margin of the carpal tunnel. The slotted cannula was introduced and the endoscope was placed into the slotted cannula and the undersurface of the transverse carpal ligament visualized. The distal margin of the transverse carpal ligament was marked by placing a 25-gauge needle percutaneously at [place] cardinal point so that it entered the distal portion of the slotted cannula. Under endoscopic visualization, the transverse carpal ligament was released from proximal to distal using the end-cutting blade. A second pass was performed to ensure complete release of the ligament. The adequacy of release was verified both endoscopically and by palpation using the freer elevator.  The wound was irrigated thoroughly with sterile saline solution before being closed using 4-0 Prolene interrupted sutures. A total of 10 cc of 0.5% plain Sensorcaine was injected in and around the incision before a sterile bulky dressing was applied to the wound. The patient was placed into a volar wrist splint before being awakened and returned to the recovery room in satisfactory condition after tolerating the procedure well.

## 2019-02-01 NOTE — Anesthesia Post-op Follow-up Note (Signed)
Anesthesia QCDR form completed.        

## 2019-02-01 NOTE — Transfer of Care (Signed)
Immediate Anesthesia Transfer of Care Note  Patient: Christopher Pruitt  Procedure(s) Performed: CARPAL TUNNEL RELEASE ENDOSCOPIC (Right Wrist)  Patient Location: PACU  Anesthesia Type:General  Level of Consciousness: drowsy  Airway & Oxygen Therapy: Patient Spontanous Breathing and Patient connected to face mask oxygen  Post-op Assessment: Report given to RN and Post -op Vital signs reviewed and stable  Post vital signs: stable  Last Vitals:  Vitals Value Taken Time  BP 127/83 02/01/19 0813  Temp 36.2 C 02/01/19 0813  Pulse 77 02/01/19 0813  Resp 13 02/01/19 0813  SpO2 100 % 02/01/19 0813  Vitals shown include unvalidated device data.  Last Pain:  Vitals:   02/01/19 0634  TempSrc: Tympanic  PainSc: 0-No pain         Complications: No apparent anesthesia complications

## 2019-02-01 NOTE — Anesthesia Procedure Notes (Signed)
Procedure Name: LMA Insertion Date/Time: 02/01/2019 7:31 AM Performed by: Lavone Orn, CRNA Pre-anesthesia Checklist: Patient identified, Emergency Drugs available, Suction available, Patient being monitored and Timeout performed Patient Re-evaluated:Patient Re-evaluated prior to induction Oxygen Delivery Method: Circle system utilized Preoxygenation: Pre-oxygenation with 100% oxygen Induction Type: IV induction Ventilation: Mask ventilation without difficulty LMA: LMA inserted LMA Size: 5.0 Number of attempts: 2 Placement Confirmation: positive ETCO2 and breath sounds checked- equal and bilateral Tube secured with: Tape Dental Injury: Teeth and Oropharynx as per pre-operative assessment

## 2019-02-01 NOTE — Anesthesia Postprocedure Evaluation (Signed)
Anesthesia Post Note  Patient: Christopher Pruitt  Procedure(s) Performed: CARPAL TUNNEL RELEASE ENDOSCOPIC (Right Wrist)  Patient location during evaluation: PACU Anesthesia Type: General Level of consciousness: awake and alert and oriented Pain management: pain level controlled Vital Signs Assessment: post-procedure vital signs reviewed and stable Respiratory status: spontaneous breathing, nonlabored ventilation and respiratory function stable Cardiovascular status: blood pressure returned to baseline and stable Postop Assessment: no signs of nausea or vomiting Anesthetic complications: no     Last Vitals:  Vitals:   02/01/19 0843 02/01/19 0847  BP: (!) 141/74 137/70  Pulse: 71 72  Resp: 13 20  Temp: (!) 36.3 C (!) 36.2 C  SpO2: 95% 97%    Last Pain:  Vitals:   02/01/19 0847  TempSrc: Temporal  PainSc: 0-No pain                 Kelsa Jaworowski

## 2019-02-01 NOTE — Anesthesia Preprocedure Evaluation (Signed)
Anesthesia Evaluation  Patient identified by MRN, date of birth, ID band Patient awake    Reviewed: Allergy & Precautions, NPO status , Patient's Chart, lab work & pertinent test results  History of Anesthesia Complications Negative for: history of anesthetic complications  Airway Mallampati: II  TM Distance: >3 FB Neck ROM: Full    Dental no notable dental hx.    Pulmonary sleep apnea and Continuous Positive Airway Pressure Ventilation , COPD,  COPD inhaler, former smoker,    breath sounds clear to auscultation- rhonchi (-) wheezing      Cardiovascular hypertension, Pt. 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) No date: Diabetes mellitus, type 2 (HCC) No date: Elevated lipids No date: Gout     Comment:  STABLE No date: History of prostate cancer No date: Hypertension No date: Hypothyroidism No date: Left hydrocele No date: Lymph edema     Comment:  Lt leg No date: Lymphedema of left leg No date: OSA on CPAP No date: Right wrist fracture     Comment:  INJURY 02-01-2014 PT FELL No date: Urge urinary incontinence No date: Wears glasses No date: Wears hearing aid     Comment:  BILATERAL   Reproductive/Obstetrics                             Anesthesia Physical Anesthesia Plan  ASA: III  Anesthesia Plan: General    Post-op Pain Management:    Induction: Intravenous  PONV Risk Score and Plan: 1 and Ondansetron  Airway Management Planned: LMA  Additional Equipment:   Intra-op Plan:   Post-operative Plan:   Informed Consent: I have reviewed the patients History and Physical, chart, labs and discussed the procedure including the risks, benefits and alternatives for the proposed anesthesia with the patient or authorized representative who has indicated his/her understanding and acceptance.     Dental advisory given  Plan Discussed with: CRNA and Anesthesiologist  Anesthesia Plan Comments:         Anesthesia Quick Evaluation

## 2019-02-02 ENCOUNTER — Encounter: Payer: Self-pay | Admitting: Surgery

## 2019-05-11 ENCOUNTER — Ambulatory Visit: Payer: Medicare Other | Attending: Internal Medicine

## 2019-05-11 DIAGNOSIS — Z20822 Contact with and (suspected) exposure to covid-19: Secondary | ICD-10-CM

## 2019-05-17 LAB — NOVEL CORONAVIRUS, NAA

## 2019-05-18 ENCOUNTER — Ambulatory Visit: Payer: Medicare Other | Attending: Internal Medicine

## 2019-05-18 DIAGNOSIS — Z20822 Contact with and (suspected) exposure to covid-19: Secondary | ICD-10-CM

## 2019-05-20 LAB — NOVEL CORONAVIRUS, NAA: SARS-CoV-2, NAA: NOT DETECTED

## 2019-09-04 DIAGNOSIS — Z9889 Other specified postprocedural states: Secondary | ICD-10-CM | POA: Insufficient documentation

## 2020-02-22 ENCOUNTER — Other Ambulatory Visit: Payer: Self-pay

## 2020-02-22 ENCOUNTER — Encounter (INDEPENDENT_AMBULATORY_CARE_PROVIDER_SITE_OTHER): Payer: Medicare Other | Admitting: Ophthalmology

## 2020-02-22 DIAGNOSIS — H35033 Hypertensive retinopathy, bilateral: Secondary | ICD-10-CM

## 2020-02-22 DIAGNOSIS — E113293 Type 2 diabetes mellitus with mild nonproliferative diabetic retinopathy without macular edema, bilateral: Secondary | ICD-10-CM

## 2020-02-22 DIAGNOSIS — H35343 Macular cyst, hole, or pseudohole, bilateral: Secondary | ICD-10-CM

## 2020-02-22 DIAGNOSIS — E11319 Type 2 diabetes mellitus with unspecified diabetic retinopathy without macular edema: Secondary | ICD-10-CM | POA: Diagnosis not present

## 2020-02-22 DIAGNOSIS — I10 Essential (primary) hypertension: Secondary | ICD-10-CM

## 2020-02-22 DIAGNOSIS — H43813 Vitreous degeneration, bilateral: Secondary | ICD-10-CM

## 2020-06-24 ENCOUNTER — Other Ambulatory Visit: Payer: Self-pay

## 2020-06-24 ENCOUNTER — Encounter (INDEPENDENT_AMBULATORY_CARE_PROVIDER_SITE_OTHER): Payer: Medicare Other | Admitting: Ophthalmology

## 2020-06-24 DIAGNOSIS — H43813 Vitreous degeneration, bilateral: Secondary | ICD-10-CM

## 2020-06-24 DIAGNOSIS — H35033 Hypertensive retinopathy, bilateral: Secondary | ICD-10-CM | POA: Diagnosis not present

## 2020-06-24 DIAGNOSIS — I10 Essential (primary) hypertension: Secondary | ICD-10-CM

## 2020-07-24 DIAGNOSIS — F330 Major depressive disorder, recurrent, mild: Secondary | ICD-10-CM | POA: Insufficient documentation

## 2020-08-28 ENCOUNTER — Ambulatory Visit (INDEPENDENT_AMBULATORY_CARE_PROVIDER_SITE_OTHER): Payer: Medicare Other | Admitting: Dermatology

## 2020-08-28 ENCOUNTER — Other Ambulatory Visit: Payer: Self-pay

## 2020-08-28 DIAGNOSIS — S40812A Abrasion of left upper arm, initial encounter: Secondary | ICD-10-CM | POA: Diagnosis not present

## 2020-08-28 DIAGNOSIS — T148XXA Other injury of unspecified body region, initial encounter: Secondary | ICD-10-CM

## 2020-08-28 DIAGNOSIS — R234 Changes in skin texture: Secondary | ICD-10-CM

## 2020-08-28 MED ORDER — MUPIROCIN 2 % EX OINT: 1.0000 "application " | TOPICAL_OINTMENT | Freq: Every day | CUTANEOUS | 0 refills | Status: DC

## 2020-08-28 NOTE — Patient Instructions (Addendum)
If you have any questions or concerns for your doctor, please call our main line at [phone] and press option 4 to reach your doctor's medical assistant. If no one answers, please leave a voicemail as directed and we will return your call as soon as possible. Messages left after 4 pm will be answered the following business day.   You may also send [place] a message via [place]. We typically respond to MyChart messages within 1-2 business days.  For prescription refills, please ask your pharmacy to contact our office. Our fax number is [phone].  If you have an urgent issue when the clinic is closed that cannot wait until the next business day, you can page your doctor at the number below.    Please note that while we do our best to be available for urgent issues outside of office hours, we are not available 24/7.   If you have an urgent issue and are unable to reach [place], you may choose to seek medical care at your doctor's office, retail clinic, urgent care center, or emergency room.  If you have a medical emergency, please immediately call 911 or go to the emergency department.  Pager Numbers  - Dr. Nehemiah Massed: [phone]  - Dr. Laurence Ferrari: [phone]  - Dr. Nicole Kindred: [phone]  In the event of inclement weather, please call our main line at [phone] for an update on the status of any delays or closures.  Dermatology Medication Tips: Please keep the boxes that topical medications come in in order to help keep track of the instructions about where and how to use these. Pharmacies typically print the medication instructions only on the boxes and not directly on the medication tubes.   If your medication is too expensive, please contact our office at [phone] option 4 or send [place] a message through [place].   We are unable to tell what your co-pay for medications will be in advance as this is different depending on your insurance coverage. However, we may be able to find a substitute  medication at lower cost or fill out paperwork to get insurance to cover a needed medication.   If a prior authorization is required to get your medication covered by your insurance company, please allow [place] 1-2 business days to complete this process.  Drug prices often vary depending on where the prescription is filled and some pharmacies may offer cheaper prices.  The website www.goodrx.com contains coupons for medications through different pharmacies. The prices here do not account for what the cost may be with help from insurance (it may be cheaper with your insurance), but the website can give you the price if you did not use any insurance.  - You can print the associated coupon and take it with your prescription to the pharmacy.  - You may also stop by our office during regular business hours and pick up a GoodRx coupon card.  - If you need your prescription sent electronically to a different pharmacy, notify our office through [facility] or by phone at [phone] option 4.   Mupirocin ointment to crust on left arm daily

## 2020-08-28 NOTE — Progress Notes (Signed)
   New Patient Visit  Subjective  Christopher Pruitt is a 75 y.o. male who presents for the following: growth (L arm, ~6wks, no symptoms, non healing, PCP txted with LN2 2-3wks ago)  The following portions of the chart were reviewed this encounter and updated as appropriate:   Tobacco  Allergies  Meds  Problems  Med Hx  Surg Hx  Fam Hx     Review of Systems:  No other skin or systemic complaints except as noted in HPI or Assessment and Plan.  Objective  Well appearing patient in no apparent distress; mood and affect are within normal limits.  A focused examination was performed including L arm. Relevant physical exam findings are noted in the Assessment and Plan.  Objective  Left distal lat bicep: Crust of L distal lat bicep, other crust foreamr   Assessment & Plan  Excoriation Left distal lat bicep Crust - likely site of Inflamed Seborrheic keratosis treatment With LN2 treatment from PCP ~2-3 wks ago Start Mupirocin oint qd Recheck after treatment area has had time to heal.  mupirocin ointment (BACTROBAN) 2 % - Left distal lat bicep  Return in about 2 weeks (around 09/11/2020) for recheck excoriation .  I, [name], RMA, am acting as scribe for Sarina Ser, MD .  Documentation: I have reviewed the above documentation for accuracy and completeness, and I agree with the above.  Sarina Ser, MD

## 2020-09-01 ENCOUNTER — Encounter: Payer: Self-pay | Admitting: Dermatology

## 2020-09-11 ENCOUNTER — Other Ambulatory Visit: Payer: Self-pay

## 2020-09-11 ENCOUNTER — Ambulatory Visit (INDEPENDENT_AMBULATORY_CARE_PROVIDER_SITE_OTHER): Payer: Medicare Other | Admitting: Dermatology

## 2020-09-11 DIAGNOSIS — D1800 Hemangioma unspecified site: Secondary | ICD-10-CM

## 2020-09-11 DIAGNOSIS — L821 Other seborrheic keratosis: Secondary | ICD-10-CM

## 2020-09-11 DIAGNOSIS — L578 Other skin changes due to chronic exposure to nonionizing radiation: Secondary | ICD-10-CM | POA: Diagnosis not present

## 2020-09-11 DIAGNOSIS — L820 Inflamed seborrheic keratosis: Secondary | ICD-10-CM

## 2020-09-11 NOTE — Patient Instructions (Signed)
Cryotherapy Aftercare  . Wash gently with soap and water everyday.   . Apply Vaseline and Band-Aid daily until healed.  

## 2020-09-11 NOTE — Progress Notes (Signed)
   Follow-Up Visit   Subjective  Christopher Pruitt is a 75 y.o. male who presents for the following: Follow-up (Pt here to have crusted area on Left distal lat bicep rechecked. Pt states that it has improved. ).  He has an irritating lesion of his neck he would like checked.  The following portions of the chart were reviewed this encounter and updated as appropriate:  Tobacco  Allergies  Meds  Problems  Med Hx  Surg Hx  Fam Hx     Review of Systems: No other skin or systemic complaints except as noted in HPI or Assessment and Plan.  Objective  Well appearing patient in no apparent distress; mood and affect are within normal limits.  A focused examination was performed including arms, back. Relevant physical exam findings are noted in the Assessment and Plan.  Objective  left neck infrauricular x 1: Erythematous keratotic or waxy stuck-on papule or plaque.   Assessment & Plan  Inflamed seborrheic keratosis left neck infrauricular x 1  Prior to procedure, discussed risks of blister formation, small wound, skin dyspigmentation, or rare scar following cryotherapy.    Destruction of lesion - left neck infrauricular x 1 Complexity: simple   Destruction method: cryotherapy   Informed consent: discussed and consent obtained   Timeout:  patient name, date of birth, surgical site, and procedure verified Lesion destroyed using liquid nitrogen: Yes   Region frozen until ice ball extended beyond lesion: Yes   Outcome: patient tolerated procedure well with no complications   Post-procedure details: wound care instructions given    Seborrheic Keratoses -of the arm with previous crust treated by his primary care physician now resolving.  Reassured okay. - Stuck-on, waxy, tan-brown papules and/or plaques  - Benign-appearing - Discussed benign etiology and prognosis. - Observe - Call for any changes  Hemangiomas - Red papules - Discussed benign nature - Observe - Call for any  changes  Actinic Damage - Chronic condition, secondary to cumulative UV/sun exposure - diffuse scaly erythematous macules with underlying dyspigmentation - Recommend daily broad spectrum sunscreen SPF 30+ to sun-exposed areas, reapply every 2 hours as needed.  - Staying in the shade or wearing long sleeves, sun glasses (UVA+UVB protection) and wide brim hats (4-inch brim around the entire circumference of the hat) are also recommended for sun protection.  - Call for new or changing lesions.  Return if symptoms worsen or fail to improve, for TBSE.   IHarriett Sine, CMA, am acting as scribe for Sarina Ser, MD.  Documentation: I have reviewed the above documentation for accuracy and completeness, and I agree with the above.  Sarina Ser, MD

## 2020-09-13 ENCOUNTER — Encounter: Payer: Self-pay | Admitting: Dermatology

## 2020-12-23 ENCOUNTER — Encounter (INDEPENDENT_AMBULATORY_CARE_PROVIDER_SITE_OTHER): Payer: Medicare Other | Admitting: Ophthalmology

## 2020-12-27 ENCOUNTER — Other Ambulatory Visit: Payer: Self-pay

## 2020-12-27 ENCOUNTER — Encounter (INDEPENDENT_AMBULATORY_CARE_PROVIDER_SITE_OTHER): Payer: Medicare Other | Admitting: Ophthalmology

## 2020-12-27 DIAGNOSIS — H43821 Vitreomacular adhesion, right eye: Secondary | ICD-10-CM | POA: Diagnosis not present

## 2020-12-27 DIAGNOSIS — I10 Essential (primary) hypertension: Secondary | ICD-10-CM

## 2020-12-27 DIAGNOSIS — H35033 Hypertensive retinopathy, bilateral: Secondary | ICD-10-CM

## 2020-12-27 DIAGNOSIS — H26491 Other secondary cataract, right eye: Secondary | ICD-10-CM

## 2020-12-27 DIAGNOSIS — H35371 Puckering of macula, right eye: Secondary | ICD-10-CM | POA: Diagnosis not present

## 2020-12-27 DIAGNOSIS — H43813 Vitreous degeneration, bilateral: Secondary | ICD-10-CM

## 2021-02-18 ENCOUNTER — Ambulatory Visit: Payer: Self-pay | Admitting: Surgery

## 2021-03-19 ENCOUNTER — Ambulatory Visit: Payer: No Typology Code available for payment source | Admitting: Dermatology

## 2021-03-24 NOTE — Progress Notes (Addendum)
COVID swab appointment: n/a  COVID Vaccine Completed: yes x4 Date COVID Vaccine completed: Has received booster: COVID vaccine manufacturer: [place]   Date of COVID positive in last 90 days: no  PCP - Emily Filbert, MD Cardiologist - n/a  Clearance by Emily Filbert 02/28/21 in Epic  Chest x-ray - n/a EKG - 02/28/21 on chart Stress Test - many years per pt ECHO - 07/31/20 Care Cardiac Cath - n/a Pacemaker/ICD device last checked: n/a Spinal Cord Stimulator: n/a  Sleep Study - yes positive CPAP - every night  Fasting Blood Sugar - 80-150 Checks Blood Sugar _1_ times a day  Blood Thinner Instructions: n/a Aspirin Instructions: Last Dose:  Activity level: Can go up a flight of stairs and perform activities of daily living without stopping and without symptoms of chest pain or shortness of breath.     Anesthesia review: COPD, DM 2, HTN, OSA  Patient denies shortness of breath, fever, cough and chest pain at PAT appointment   Patient verbalized understanding of instructions that were given to them at the PAT appointment. Patient was also instructed that they will need to review over the PAT instructions again at home before surgery.

## 2021-03-24 NOTE — Patient Instructions (Addendum)
DUE TO COVID-19 ONLY ONE VISITOR IS ALLOWED TO COME WITH YOU AND STAY IN THE WAITING ROOM ONLY DURING PRE OP AND PROCEDURE.   **NO VISITORS ARE ALLOWED IN THE SHORT STAY AREA OR RECOVERY ROOM!!**       Your procedure is scheduled on: 04/18/21   Report to [facility] Main Entrance    Report to admitting at 5:15 AM   Call this number if you have problems the morning of surgery [phone]   Do not eat food :After Midnight.   May have liquids until 4:30 AM day of surgery  CLEAR LIQUID DIET  Foods Allowed                                                                     Foods Excluded  Water, Black Coffee and tea (no milk or creamer)           liquids that you cannot  Plain Jell-O in any flavor  (No red)                                    see through such as: Fruit ices (not with fruit pulp)                                            milk, soups, orange juice              Iced Popsicles (No red)                                                 All solid food                                   Apple juices Sports drinks like Gatorade (No red) Lightly seasoned clear broth or consume(fat free) Sugar   The day of surgery:  Drink ONE (1) Pre-Surgery G2 by 4:30 am the morning of surgery. Drink in one sitting. Do not sip.  This drink was given to you during your hospital  pre-op appointment visit. Nothing else to drink after completing the  Pre-Surgery G2.          If you have questions, please contact your surgeon's office.     Oral Hygiene is also important to reduce your risk of infection.                                    Remember - BRUSH YOUR TEETH THE MORNING OF SURGERY WITH YOUR REGULAR TOOTHPASTE   Take these medicines the morning of surgery with A SIP OF WATER: Tylenol, Zyloprim, Prozac, Synthroid, Ditropan, Prednisone.   DO NOT TAKE ANY ORAL DIABETIC MEDICATIONS DAY OF YOUR SURGERY  How to Manage Your Diabetes Before and After Surgery  Why is it important  to  control my blood sugar before and after surgery? Improving blood sugar levels before and after surgery helps healing and can limit problems. A way of improving blood sugar control is eating a healthy diet by:  Eating less sugar and carbohydrates  Increasing activity/exercise  Talking with your doctor about reaching your blood sugar goals High blood sugars (greater than 180 mg/dL) can raise your risk of infections and slow your recovery, so you will need to focus on controlling your diabetes during the weeks before surgery. Make sure that the doctor who takes care of your diabetes knows about your planned surgery including the date and location.  How do I manage my blood sugar before surgery? Check your blood sugar at least 4 times a day, starting 2 days before surgery, to make sure that the level is not too high or low. Check your blood sugar the morning of your surgery when you wake up and every 2 hours until you get to the Short Stay unit. If your blood sugar is less than 70 mg/dL, you will need to treat for low blood sugar: Do not take insulin. Treat a low blood sugar (less than 70 mg/dL) with  cup of clear juice (cranberry or apple), 4 glucose tablets, OR glucose gel. Recheck blood sugar in 15 minutes after treatment (to make sure it is greater than 70 mg/dL). If your blood sugar is not greater than 70 mg/dL on recheck, call [phone] for further instructions. Report your blood sugar to the short stay nurse when you get to Short Stay.  If you are admitted to the hospital after surgery: Your blood sugar will be checked by the staff and you will probably be given insulin after surgery (instead of oral diabetes medicines) to make sure you have good blood sugar levels. The goal for blood sugar control after surgery is 80-180 mg/dL.   WHAT DO I DO ABOUT MY DIABETES MEDICATION?  Do not take oral diabetes medicines (pills) the morning of surgery.  THE DAY BEFORE SURGERY, take Metformin as  prescribed. Do not take Glipizide.       THE MORNING OF SURGERY, do not take Metformin or Glipizide.    Reviewed and Endorsed by [facility] Patient Education Committee, August 2015                               You may not have any metal on your body including jewelry, and body piercing             Do not wear lotions, powders, cologne, or deodorant              Men may shave face and neck.   Do not bring valuables to the hospital. [place].   Contacts, dentures or bridgework may not be worn into surgery.    Patients discharged on the day of surgery will not be allowed to drive home.   Special Instructions: Bring a copy of your healthcare power of attorney and living will documents         the day of surgery if you haven't scanned them before.              Please read over the following fact sheets you were given: IF YOU HAVE QUESTIONS ABOUT YOUR PRE-OP INSTRUCTIONS PLEASE CALL [phone]- Apolonio Schneiders  [place] - Preparing for Surgery Before surgery, you can play an important role.  Because skin is not sterile, your skin needs to be as free of germs as possible.  You can reduce the number of germs on your skin by washing with CHG (chlorahexidine gluconate) soap before surgery.  CHG is an antiseptic cleaner which kills germs and bonds with the skin to continue killing germs even after washing. Please DO NOT use if you have an allergy to CHG or antibacterial soaps.  If your skin becomes reddened/irritated stop using the CHG and inform your nurse when you arrive at Short Stay. Do not shave (including legs and underarms) for at least 48 hours prior to the first CHG shower.  You may shave your face/neck.  Please follow these instructions carefully:  1.  Shower with CHG Soap the night before surgery and the  morning of surgery.  2.  If you choose to wash your hair, wash your hair first as usual with your normal  shampoo.  3.  After you  shampoo, rinse your hair and body thoroughly to remove the shampoo.                             4.  Use CHG as you would any other liquid soap.  You can apply chg directly to the skin and wash.  Gently with a scrungie or clean washcloth.  5.  Apply the CHG Soap to your body ONLY FROM THE NECK DOWN.   Do   not use on face/ open                           Wound or open sores. Avoid contact with eyes, ears mouth and   genitals (private parts).                       Wash face,  Genitals (private parts) with your normal soap.             6.  Wash thoroughly, paying special attention to the area where your    surgery  will be performed.  7.  Thoroughly rinse your body with warm water from the neck down.  8.  DO NOT shower/wash with your normal soap after using and rinsing off the CHG Soap.                9.  Pat yourself dry with a clean towel.            10.  Wear clean pajamas.            11.  Place clean sheets on your bed the night of your first shower and do not  sleep with pets. Day of Surgery : Do not apply any lotions/deodorants the morning of surgery.  Please wear clean clothes to the hospital/surgery center.  FAILURE TO FOLLOW THESE INSTRUCTIONS MAY RESULT IN THE CANCELLATION OF YOUR SURGERY  PATIENT SIGNATURE_________________________________  NURSE SIGNATURE__________________________________  ________________________________________________________________________

## 2021-03-26 ENCOUNTER — Encounter (HOSPITAL_COMMUNITY): Payer: Self-pay

## 2021-03-26 ENCOUNTER — Encounter (HOSPITAL_COMMUNITY)
Admission: RE | Admit: 2021-03-26 | Discharge: 2021-03-26 | Disposition: A | Discharge status: Home / Self Care | Payer: Medicare Other | Source: Ambulatory Visit | Attending: Surgery | Admitting: Surgery

## 2021-03-26 ENCOUNTER — Other Ambulatory Visit: Payer: Self-pay

## 2021-03-26 VITALS — BP 157/86 | HR 80 | Temp 98.1°F | Resp 18 | Ht 67.0 in | Wt 210.0 lb

## 2021-03-26 DIAGNOSIS — Z01812 Encounter for preprocedural laboratory examination: Secondary | ICD-10-CM | POA: Insufficient documentation

## 2021-03-26 DIAGNOSIS — E119 Type 2 diabetes mellitus without complications: Secondary | ICD-10-CM | POA: Insufficient documentation

## 2021-03-26 HISTORY — DX: Personal history of urinary calculi: Z87.442

## 2021-03-26 HISTORY — DX: Anemia, unspecified: D64.9

## 2021-03-26 HISTORY — DX: Cardiac murmur, unspecified: R01.1

## 2021-03-26 LAB — CBC
HCT: 34.4 % — ABNORMAL LOW (ref 39.0–52.0)
Hemoglobin: 11.6 g/dL — ABNORMAL LOW (ref 13.0–17.0)
MCH: 30.6 pg (ref 26.0–34.0)
MCHC: 33.7 g/dL (ref 30.0–36.0)
MCV: 90.8 fL (ref 80.0–100.0)
Platelets: 138 K/uL — ABNORMAL LOW (ref 150–400)
RBC: 3.79 MIL/uL — ABNORMAL LOW (ref 4.22–5.81)
RDW: 15.8 % — ABNORMAL HIGH (ref 11.5–15.5)
WBC: 4.8 K/uL (ref 4.0–10.5)
nRBC: 0 % (ref 0.0–0.2)

## 2021-03-26 LAB — BASIC METABOLIC PANEL WITH GFR
Anion gap: 8 (ref 5–15)
BUN: 26 mg/dL — ABNORMAL HIGH (ref 8–23)
CO2: 26 mmol/L (ref 22–32)
Calcium: 9.1 mg/dL (ref 8.9–10.3)
Chloride: 105 mmol/L (ref 98–111)
Creatinine, Ser: 1.01 mg/dL (ref 0.61–1.24)
GFR, Estimated: >60 mL/min
Glucose, Bld: 95 mg/dL (ref 70–99)
Potassium: 4.2 mmol/L (ref 3.5–5.1)
Sodium: 139 mmol/L (ref 135–145)

## 2021-03-26 LAB — GLUCOSE, CAPILLARY: Glucose-Capillary: 89 mg/dL (ref 70–99)

## 2021-03-26 LAB — HEMOGLOBIN A1C
Hgb A1c MFr Bld: 6 % — ABNORMAL HIGH (ref 4.8–5.6)
Mean Plasma Glucose: 125.5 mg/dL

## 2021-04-17 NOTE — Anesthesia Preprocedure Evaluation (Addendum)
Anesthesia Evaluation  Patient identified by MRN, date of birth, ID band Patient awake    Reviewed: Allergy & Precautions, NPO status , Patient's Chart, lab work & pertinent test results  History of Anesthesia Complications Negative for: history of anesthetic complications  Airway Mallampati: II  TM Distance: >3 FB Neck ROM: Full    Dental  (+) Missing,    Pulmonary sleep apnea and Continuous Positive Airway Pressure Ventilation , COPD, former smoker,    Pulmonary exam normal        Cardiovascular hypertension, Pt. on medications Normal cardiovascular exam     Neuro/Psych negative neurological ROS  negative psych ROS   GI/Hepatic negative GI ROS, Neg liver ROS,   Endo/Other  diabetes, Type 2, Oral Hypoglycemic AgentsHypothyroidism   Renal/GU negative Renal ROS  negative genitourinary   Musculoskeletal negative musculoskeletal ROS (+)   Abdominal   Peds  Hematology  (+) anemia , Hgb 11.6   Anesthesia Other Findings Day of surgery medications reviewed with patient.  Reproductive/Obstetrics negative OB ROS                            Anesthesia Physical Anesthesia Plan  ASA: 2  Anesthesia Plan: General   Post-op Pain Management: Tylenol PO (pre-op)   Induction: Intravenous  PONV Risk Score and Plan: 3 and Treatment may vary due to age or medical condition, Ondansetron and Dexamethasone  Airway Management Planned: Oral ETT  Additional Equipment: None  Intra-op Plan:   Post-operative Plan: Extubation in OR  Informed Consent: I have reviewed the patients History and Physical, chart, labs and discussed the procedure including the risks, benefits and alternatives for the proposed anesthesia with the patient or authorized representative who has indicated his/her understanding and acceptance.     Dental advisory given  Plan Discussed with: CRNA  Anesthesia Plan Comments:         Anesthesia Quick Evaluation

## 2021-04-18 ENCOUNTER — Ambulatory Visit (HOSPITAL_COMMUNITY): Payer: Medicare Other | Admitting: Certified Registered Nurse Anesthetist

## 2021-04-18 ENCOUNTER — Ambulatory Visit (HOSPITAL_COMMUNITY)
Admission: RE | Admit: 2021-04-18 | Discharge: 2021-04-18 | Disposition: A | Discharge status: Home / Self Care | Payer: Medicare Other | Attending: Surgery | Admitting: Surgery

## 2021-04-18 ENCOUNTER — Ambulatory Visit (HOSPITAL_COMMUNITY): Payer: Medicare Other | Admitting: Physician Assistant

## 2021-04-18 ENCOUNTER — Encounter (HOSPITAL_COMMUNITY)
Admission: RE | Disposition: A | Discharge status: Home / Self Care | Payer: Self-pay | Source: Home / Self Care | Attending: Surgery

## 2021-04-18 ENCOUNTER — Encounter (HOSPITAL_COMMUNITY): Payer: Self-pay | Admitting: Surgery

## 2021-04-18 DIAGNOSIS — Z6832 Body mass index (BMI) 32.0-32.9, adult: Secondary | ICD-10-CM | POA: Insufficient documentation

## 2021-04-18 DIAGNOSIS — E119 Type 2 diabetes mellitus without complications: Secondary | ICD-10-CM | POA: Insufficient documentation

## 2021-04-18 DIAGNOSIS — I10 Essential (primary) hypertension: Secondary | ICD-10-CM | POA: Insufficient documentation

## 2021-04-18 DIAGNOSIS — K4030 Unilateral inguinal hernia, with obstruction, without gangrene, not specified as recurrent: Secondary | ICD-10-CM | POA: Diagnosis present

## 2021-04-18 DIAGNOSIS — Z87891 Personal history of nicotine dependence: Secondary | ICD-10-CM | POA: Diagnosis not present

## 2021-04-18 DIAGNOSIS — J449 Chronic obstructive pulmonary disease, unspecified: Secondary | ICD-10-CM | POA: Insufficient documentation

## 2021-04-18 DIAGNOSIS — E669 Obesity, unspecified: Secondary | ICD-10-CM

## 2021-04-18 DIAGNOSIS — K4090 Unilateral inguinal hernia, without obstruction or gangrene, not specified as recurrent: Secondary | ICD-10-CM | POA: Diagnosis not present

## 2021-04-18 DIAGNOSIS — Z79899 Other long term (current) drug therapy: Secondary | ICD-10-CM | POA: Insufficient documentation

## 2021-04-18 DIAGNOSIS — G4730 Sleep apnea, unspecified: Secondary | ICD-10-CM | POA: Insufficient documentation

## 2021-04-18 DIAGNOSIS — G4733 Obstructive sleep apnea (adult) (pediatric): Secondary | ICD-10-CM

## 2021-04-18 DIAGNOSIS — E039 Hypothyroidism, unspecified: Secondary | ICD-10-CM | POA: Diagnosis not present

## 2021-04-18 DIAGNOSIS — Z7984 Long term (current) use of oral hypoglycemic drugs: Secondary | ICD-10-CM | POA: Diagnosis not present

## 2021-04-18 HISTORY — PX: INGUINAL HERNIA REPAIR: SHX194

## 2021-04-18 LAB — GLUCOSE, CAPILLARY: Glucose-Capillary: 155 mg/dL — ABNORMAL HIGH (ref 70–99)

## 2021-04-18 SURGERY — REPAIR, HERNIA, INGUINAL, BILATERAL, LAPAROSCOPIC
Anesthesia: General | Site: Abdomen | Laterality: Bilateral

## 2021-04-18 MED ORDER — BUPIVACAINE-EPINEPHRINE 0.25% -1:200000 IJ SOLN
INTRAMUSCULAR | Status: AC
  Filled 2021-04-18: qty 1

## 2021-04-18 MED ORDER — PHENYLEPHRINE 40 MCG/ML (10ML) SYRINGE FOR IV PUSH (FOR BLOOD PRESSURE SUPPORT)
PREFILLED_SYRINGE | INTRAVENOUS | Status: AC
  Filled 2021-04-18: qty 10

## 2021-04-18 MED ORDER — CEFAZOLIN SODIUM-DEXTROSE 2-4 GM/100ML-% IV SOLN: 2.0000 g | INTRAVENOUS | Status: DC

## 2021-04-18 MED ORDER — LIDOCAINE 2% (20 MG/ML) 5 ML SYRINGE
INTRAMUSCULAR | Status: DC | PRN
  Administered 2021-04-18: 100 mg via INTRAVENOUS

## 2021-04-18 MED ORDER — PHENYLEPHRINE 40 MCG/ML (10ML) SYRINGE FOR IV PUSH (FOR BLOOD PRESSURE SUPPORT)
PREFILLED_SYRINGE | INTRAVENOUS | Status: DC | PRN
  Administered 2021-04-18 (×5): 80 ug via INTRAVENOUS

## 2021-04-18 MED ORDER — GLYCOPYRROLATE 0.2 MG/ML IJ SOLN
INTRAMUSCULAR | Status: DC | PRN
  Administered 2021-04-18: .2 mg via INTRAVENOUS

## 2021-04-18 MED ORDER — SUGAMMADEX SODIUM 200 MG/2ML IV SOLN
INTRAVENOUS | Status: DC | PRN
Start: 2021-04-18 — End: 2021-04-18
  Administered 2021-04-18: 200 mg via INTRAVENOUS

## 2021-04-18 MED ORDER — FENTANYL CITRATE (PF) 100 MCG/2ML IJ SOLN
INTRAMUSCULAR | Status: AC
  Filled 2021-04-18: qty 2

## 2021-04-18 MED ORDER — EPHEDRINE 5 MG/ML INJ
INTRAVENOUS | Status: AC
  Filled 2021-04-18: qty 5

## 2021-04-18 MED ORDER — LIDOCAINE HCL (PF) 2 % IJ SOLN
INTRAMUSCULAR | Status: AC
  Filled 2021-04-18: qty 5

## 2021-04-18 MED ORDER — BUPIVACAINE-EPINEPHRINE 0.25% -1:200000 IJ SOLN
INTRAMUSCULAR | Status: DC | PRN
  Administered 2021-04-18: 50 mL

## 2021-04-18 MED ORDER — DEXAMETHASONE SODIUM PHOSPHATE 10 MG/ML IJ SOLN
INTRAMUSCULAR | Status: AC
  Filled 2021-04-18: qty 1

## 2021-04-18 MED ORDER — RINGERS IRRIGATION IR SOLN
Status: DC | PRN
  Administered 2021-04-18: 1

## 2021-04-18 MED ORDER — ROCURONIUM BROMIDE 10 MG/ML (PF) SYRINGE
PREFILLED_SYRINGE | INTRAVENOUS | Status: AC
  Filled 2021-04-18: qty 10

## 2021-04-18 MED ORDER — ALBUMIN HUMAN 5 % IV SOLN: 12.5000 g | Freq: Four times a day (QID) | INTRAVENOUS | Status: DC | PRN

## 2021-04-18 MED ORDER — GABAPENTIN 300 MG PO CAPS: 300.0000 mg | ORAL_CAPSULE | ORAL | Status: AC

## 2021-04-18 MED ORDER — LACTATED RINGERS IV SOLN: INTRAVENOUS | Status: DC

## 2021-04-18 MED ORDER — PROPOFOL 10 MG/ML IV BOLUS
INTRAVENOUS | Status: DC | PRN
  Administered 2021-04-18: 150 mg via INTRAVENOUS

## 2021-04-18 MED ORDER — GABAPENTIN 300 MG PO CAPS
ORAL_CAPSULE | ORAL | Status: AC
  Administered 2021-04-18: 300 mg via ORAL
  Filled 2021-04-18: qty 1

## 2021-04-18 MED ORDER — DEXAMETHASONE SODIUM PHOSPHATE 10 MG/ML IJ SOLN
INTRAMUSCULAR | Status: DC | PRN
  Administered 2021-04-18: 4 mg via INTRAVENOUS

## 2021-04-18 MED ORDER — CEFAZOLIN SODIUM-DEXTROSE 2-4 GM/100ML-% IV SOLN
2.0000 g | INTRAVENOUS | Status: AC
  Administered 2021-04-18: 2 g via INTRAVENOUS

## 2021-04-18 MED ORDER — SODIUM CHLORIDE 0.9% FLUSH: 3.0000 mL | Freq: Two times a day (BID) | INTRAVENOUS | Status: DC

## 2021-04-18 MED ORDER — BUPIVACAINE-EPINEPHRINE 0.25% -1:200000 IJ SOLN: 10.0000 mL | Freq: Once | INTRAMUSCULAR | Status: DC

## 2021-04-18 MED ORDER — ORAL CARE MOUTH RINSE
15.0000 mL | Freq: Once | OROMUCOSAL | Status: AC
  Administered 2021-04-18: 15 mL via OROMUCOSAL

## 2021-04-18 MED ORDER — ROCURONIUM BROMIDE 10 MG/ML (PF) SYRINGE
PREFILLED_SYRINGE | INTRAVENOUS | Status: DC | PRN
  Administered 2021-04-18 (×2): 20 mg via INTRAVENOUS
  Administered 2021-04-18: 60 mg via INTRAVENOUS

## 2021-04-18 MED ORDER — SODIUM CHLORIDE 0.9% FLUSH: 3.0000 mL | INTRAVENOUS | Status: DC | PRN

## 2021-04-18 MED ORDER — ACETAMINOPHEN 500 MG PO TABS
ORAL_TABLET | ORAL | Status: AC
  Filled 2021-04-18: qty 2

## 2021-04-18 MED ORDER — EPHEDRINE SULFATE-NACL 50-0.9 MG/10ML-% IV SOSY
PREFILLED_SYRINGE | INTRAVENOUS | Status: DC | PRN
  Administered 2021-04-18: 10 mg via INTRAVENOUS
  Administered 2021-04-18: 5 mg via INTRAVENOUS
  Administered 2021-04-18: 10 mg via INTRAVENOUS

## 2021-04-18 MED ORDER — FENTANYL CITRATE PF 50 MCG/ML IJ SOSY: 25.0000 ug | PREFILLED_SYRINGE | INTRAMUSCULAR | Status: DC | PRN

## 2021-04-18 MED ORDER — ONDANSETRON HCL 4 MG/2ML IJ SOLN
INTRAMUSCULAR | Status: DC | PRN
  Administered 2021-04-18: 4 mg via INTRAVENOUS

## 2021-04-18 MED ORDER — SODIUM CHLORIDE 0.9 % IR SOLN
Status: DC | PRN
  Administered 2021-04-18: 1000 mL

## 2021-04-18 MED ORDER — CHLORHEXIDINE GLUCONATE 0.12 % MT SOLN: 15.0000 mL | Freq: Once | OROMUCOSAL | Status: AC

## 2021-04-18 MED ORDER — TRAMADOL HCL 50 MG PO TABS: 50.0000 mg | ORAL_TABLET | Freq: Four times a day (QID) | ORAL | 0 refills | Status: DC | PRN

## 2021-04-18 MED ORDER — ONDANSETRON HCL 4 MG/2ML IJ SOLN
INTRAMUSCULAR | Status: AC
  Filled 2021-04-18: qty 2

## 2021-04-18 MED ORDER — CHLORHEXIDINE GLUCONATE CLOTH 2 % EX PADS: 6.0000 | MEDICATED_PAD | Freq: Once | CUTANEOUS | Status: DC

## 2021-04-18 MED ORDER — SODIUM CHLORIDE 0.9 % IV SOLN: 250.0000 mL | INTRAVENOUS | Status: DC | PRN

## 2021-04-18 MED ORDER — ENSURE PRE-SURGERY PO LIQD
296.0000 mL | Freq: Once | ORAL | Status: DC
  Filled 2021-04-18: qty 296

## 2021-04-18 MED ORDER — GLYCOPYRROLATE 0.2 MG/ML IJ SOLN
INTRAMUSCULAR | Status: AC
  Filled 2021-04-18: qty 1

## 2021-04-18 MED ORDER — PROPOFOL 10 MG/ML IV BOLUS
INTRAVENOUS | Status: AC
  Filled 2021-04-18: qty 20

## 2021-04-18 MED ORDER — BUPIVACAINE LIPOSOME 1.3 % IJ SUSP
INTRAMUSCULAR | Status: DC | PRN
  Administered 2021-04-18: 20 mL

## 2021-04-18 MED ORDER — BUPIVACAINE LIPOSOME 1.3 % IJ SUSP
20.0000 mL | Freq: Once | INTRAMUSCULAR | Status: DC
  Filled 2021-04-18: qty 20

## 2021-04-18 MED ORDER — BUPIVACAINE-EPINEPHRINE (PF) 0.25% -1:200000 IJ SOLN
30.0000 mL | Freq: Once | INTRAMUSCULAR | Status: DC
  Filled 2021-04-18: qty 30

## 2021-04-18 MED ORDER — CEFAZOLIN SODIUM-DEXTROSE 2-4 GM/100ML-% IV SOLN
INTRAVENOUS | Status: AC
  Filled 2021-04-18: qty 100

## 2021-04-18 MED ORDER — FENTANYL CITRATE (PF) 100 MCG/2ML IJ SOLN
INTRAMUSCULAR | Status: DC | PRN
  Administered 2021-04-18: 50 ug via INTRAVENOUS
  Administered 2021-04-18: 100 ug via INTRAVENOUS
  Administered 2021-04-18 (×3): 50 ug via INTRAVENOUS

## 2021-04-18 MED ORDER — ACETAMINOPHEN 500 MG PO TABS: 1000.0000 mg | ORAL_TABLET | ORAL | Status: DC

## 2021-04-18 SURGICAL SUPPLY — 40 items
BAG COUNTER SPONGE SURGICOUNT (BAG) IMPLANT
CABLE HIGH FREQUENCY MONO STRZ (ELECTRODE) ×2 IMPLANT
CHLORAPREP W/TINT 26 (MISCELLANEOUS) ×2 IMPLANT
COVER SURGICAL LIGHT HANDLE (MISCELLANEOUS) ×2 IMPLANT
DECANTER SPIKE VIAL GLASS SM (MISCELLANEOUS) ×2 IMPLANT
DEVICE SECURE STRAP 25 ABSORB (INSTRUMENTS) IMPLANT
DRAPE WARM FLUID 44X44 (DRAPES) ×2 IMPLANT
DRSG TEGADERM 2-3/8X2-3/4 SM (GAUZE/BANDAGES/DRESSINGS) ×2 IMPLANT
DRSG TEGADERM 4X4.75 (GAUZE/BANDAGES/DRESSINGS) ×2 IMPLANT
ELECT REM PT RETURN 15FT ADLT (MISCELLANEOUS) ×2 IMPLANT
GAUZE 4X4 16PLY ~~LOC~~+RFID DBL (SPONGE) ×2 IMPLANT
GAUZE SPONGE 2X2 8PLY STRL LF (GAUZE/BANDAGES/DRESSINGS) ×1 IMPLANT
GLOVE SURG NEOPR MICRO LF SZ8 (GLOVE) ×2 IMPLANT
GLOVE SURG UNDER LTX SZ8 (GLOVE) ×2 IMPLANT
GOWN STRL REUS W/TWL XL LVL3 (GOWN DISPOSABLE) ×4 IMPLANT
IRRIG SUCT STRYKERFLOW 2 WTIP (MISCELLANEOUS) ×2
IRRIGATION SUCT STRKRFLW 2 WTP (MISCELLANEOUS) ×1 IMPLANT
KIT BASIN OR (CUSTOM PROCEDURE TRAY) ×2 IMPLANT
KIT TURNOVER KIT A (KITS) IMPLANT
MARKER SKIN DUAL TIP RULER LAB (MISCELLANEOUS) ×2 IMPLANT
MESH HERNIA 6X6 BARD (Mesh General) ×3 IMPLANT
MESH HERNIA BARD 6X6 (Mesh General) ×3 IMPLANT
NEEDLE INSUFFLATION 14GA 120MM (NEEDLE) IMPLANT
NS IRRIG 1000ML POUR BTL (IV SOLUTION) ×2 IMPLANT
PAD POSITIONING PINK XL (MISCELLANEOUS) ×2 IMPLANT
SCISSORS LAP 5X35 DISP (ENDOMECHANICALS) ×2 IMPLANT
SET TUBE SMOKE EVAC HIGH FLOW (TUBING) ×2 IMPLANT
SLEEVE ADV FIXATION 5X100MM (TROCAR) ×2 IMPLANT
SPONGE GAUZE 2X2 STER 10/PKG (GAUZE/BANDAGES/DRESSINGS) ×1
SUT MNCRL AB 4-0 PS2 18 (SUTURE) ×2 IMPLANT
SUT PDS AB 1 CT1 27 (SUTURE) ×4 IMPLANT
SUT VIC AB 2-0 SH 27 (SUTURE) ×1
SUT VIC AB 2-0 SH 27X BRD (SUTURE) ×1 IMPLANT
SUT VICRYL 0 UR6 27IN ABS (SUTURE) ×2 IMPLANT
TACKER 5MM HERNIA 3.5CML NAB (ENDOMECHANICALS) IMPLANT
TOWEL OR 17X26 10 PK STRL BLUE (TOWEL DISPOSABLE) ×2 IMPLANT
TOWEL OR NON WOVEN STRL DISP B (DISPOSABLE) ×2 IMPLANT
TRAY LAPAROSCOPIC (CUSTOM PROCEDURE TRAY) ×2 IMPLANT
TROCAR ADV FIXATION 5X100MM (TROCAR) ×2 IMPLANT
TROCAR XCEL BLUNT TIP 100MML (ENDOMECHANICALS) ×2 IMPLANT

## 2021-04-18 NOTE — Op Note (Signed)
04/18/2021  10:13 AM  PATIENT:  Christopher Pruitt  75 y.o. male  Patient Care Team: Rusty Aus, MD as PCP - General (Internal Medicine) Michael Boston, MD as Consulting Physician (General Surgery) Ceasar Mons, MD as Consulting Physician (Urology) Emmaline Kluver., MD (Rheumatology) Efrain Sella, MD as Consulting Physician (Gastroenterology) Erby Pian, MD as Referring Physician (Pulmonary Disease)  PRE-OPERATIVE DIAGNOSIS:   LEFT SCROTAL INCARCERATED INGUINAL HERNIA RIGHT INGUINAL HERNIA  POST-OPERATIVE DIAGNOSIS:   LEFT SCROTAL INCARCERATED INGUINAL HERNIA RIGHT INGUINAL HERNIA  PROCEDURE:   LAPAROSCOPIC BILATERAL HERNIA REPAIR WITH MESH TRANSVERSUS ABDOMINIS PLANE (TAP) BLOCK - BILATERAL  SURGEON:  Adin Hector, MD  ASSISTANT: None  ANESTHESIA:     Regional ilioinguinal and genitofemoral and spermatic cord nerve blocks  General  Regional TRANSVERSUS ABDOMINIS PLANE (TAP) nerve block for perioperative & postoperative pain control provided with liposomal bupivacaine (Experel) mixed with 0.25% bupivacaine as a Bilateral TAP block x 74mL each side at the level of the transverse abdominis & preperitoneal spaces along the flank at the anterior axillary line, from subcostal ridge to iliac crest under laparoscopic guidance    EBL:  Total I/O In: 1300 [I.V.:1200; IV Piggyback:100] Out: 20 [Blood:20].  See anesthesia record  Delay start of Pharmacological VTE agent (>24hrs) due to surgical blood loss or risk of bleeding:  no  DRAINS: NONE  SPECIMEN: Spermatic cord lipomas.  Not sent   DISPOSITION OF SPECIMEN:  N/A  COUNTS:  YES  PLAN OF CARE: Discharge to home after PACU  PATIENT DISPOSITION:  PACU - hemodynamically stable.  INDICATION: Pleasant morbidly obese male with numerous health issues with large left inguinal hernia going into scrotum containing colon.  Probable right inguinal hernia as well some increasing discomfort.  I  recommended laparoscopic possible open exploration and repair of hernias found  The anatomy & physiology of the abdominal wall and pelvic floor was discussed.  The pathophysiology of hernias in the inguinal and pelvic region was discussed.  Natural history risks such as progressive enlargement, pain, incarceration & strangulation was discussed.   Contributors to complications such as smoking, obesity, diabetes, prior surgery, etc were discussed.    I feel the risks of no intervention will lead to serious problems that outweigh the operative risks; therefore, I recommended surgery to reduce and repair the hernia.  I explained laparoscopic techniques with possible need for an open approach.  I noted usual use of mesh to patch and/or buttress hernia repair  Risks such as bleeding, infection, abscess, need for further treatment, heart attack, death, and other risks were discussed.  I noted a good likelihood this will help address the problem.   Goals of post-operative recovery were discussed as well.  Possibility that this will not correct all symptoms was explained.  I stressed the importance of low-impact activity, aggressive pain control, avoiding constipation, & not pushing through pain to minimize risk of post-operative chronic pain or injury. Possibility of reherniation was discussed.  We will work to minimize complications.     An educational handout further explaining the pathology & treatment options was given as well.  Questions were answered.  The patient expresses understanding & wishes to proceed with surgery.  OR FINDINGS: Large indirect inguinal hernia on the left side incarcerated with sigmoid colon going down into the scrotum.  No definite obstruction.  Some omentum.  Eventually reduced.  Mild direct space and femoral canal laxities.  No obturator hernia  Smaller but definite indirect inguinal hernia  on the right side.  No direct space, femoral, obturator hernia.  No obvious umbilical  hernia.  DESCRIPTION:  The patient was identified & brought into the operating room. The patient was positioned supine with arms tucked. SCDs were active during the entire case. The patient underwent general anesthesia without any difficulty.  The abdomen was prepped and draped in a sterile fashion. The patient's bladder was emptied.  A Surgical Timeout confirmed our plan.  I made a transverse incision through the inferior umbilical fold.  I made a small transverse nick through the anterior rectus fascia contralateral to the inguinal hernia side and placed a 0-vicryl stitch through the fascia.  I placed a Hasson trocar into the preperitoneal plane.  Entry was clean.  We induced carbon dioxide insufflation. Camera inspection revealed no injury.  I used a 24mm angled scope to bluntly free the peritoneum off the infraumbilical anterior abdominal wall.  I created enough of a preperitoneal pocket to place 85mm ports into the right & left mid-abdomen into this preperitoneal cavity.  I focused attention on the LEFT pelvis since that was the dominant hernia side.   I used blunt & focused sharp dissection to free the peritoneum off the flank and down to the pubic rim.  I freed the anteriolateral bladder wall off the anteriolateral pelvic wall, sparing midline attachments.   I located a swath of peritoneum going into a hernia fascial defect at the  internal ring consistent with  an indirect inguinal hernia.  It was quite giant.  However eventually with laparoscopic reduction and scrotal counterpressure I could get it partially reduced.  I opened the hernia sac and was able to reduce the greater omentum and sigmoid colon out of the inguinal canal.  Without I could better identify and confirm the hernia..  I gradually freed the peritoneal hernia sac off safely and reduced it into the preperitoneal space.  I freed the peritoneum off the spermatic vessels & vas deferens.  I freed peritoneum off the retroperitoneum along  the psoas muscle.  Spermatic cord lipoma was dissected away & removed.  Freddrick March off large spermatic cord lipomas and some omentum and remove that.  Primarily close the hernia sac using 2-0 Vicryl suture in a running fashion laparoscopically I checked & assured hemostasis.     I turned attention on the opposite  RIGHT pelvis.  I did dissection in a similar, mirror-image fashion. The patient had an indirect inguinal hernia.Marland Kitchen   Spermatic cord lipoma was dissected away & removed.    I checked & assured hemostasis.     I chose sheets of medium-weight polypropylene Bard Marlex 15x15cm, one for each side.  I cut a single sigmoid-shaped slit ~6cm from a corner of each mesh.  I placed the meshes into the preperitoneal space & laid them as overlapping diamonds such that at the inferior points, a 6x6 cm corner flap rested in the true anterolateral pelvis, covering the obturator & femoral foramina.   I allowed the bladder to return to the pubis, this helping tuck the corners of the mesh in the anteriolateral pelvis.  The medial corners overlapped each other across midline cephalad to the pubic rim.   Given the numerous hernias of moderate size, I placed a third 15x15cm mesh in the center as a vertical diamond.  The lateral wings of the mesh overlap across the direct spaces and internal rings where the dominant hernias were.  This provided good coverage and reinforcement of the hernia repairs.  Because of  the central mesh placement with good overlap, I did not place any tacks.   I held the hernia sacs cephalad & evacuated carbon dioxide.  I closed the fascia with absorbable suture.  I closed the skin using 4-0 monocryl stitch.  Sterile dressings were applied.   The patient was extubated & arrived in the PACU in stable condition..  I had discussed postoperative care with the patient in the holding area.  Instructions are written in the chart.  I discussed operative findings, updated the patient's status, discussed probable  steps to recovery, and gave postoperative recommendations to the patient's spouse, Ermias Tomeo.  Recommendations were made.  Questions were answered.  She expressed understanding & appreciation.   Adin Hector, M.D., F.A.C.S. Gastrointestinal and Minimally Invasive Surgery [facility] [address], [place], [place] [address] [phone] Main / Paging  04/18/2021 10:13 AM

## 2021-04-18 NOTE — Transfer of Care (Signed)
Immediate Anesthesia Transfer of Care Note  Patient: Christopher Pruitt  Procedure(s) Performed: LAPAROSCOPIC BILATERAL  HERNIA REPAIR WITH MESH, TRANSABDOMINAL PLANE BLOCK, LEFT INCARCARATED SCROTAL HERNIA, RIGHT INGUINAL HERNIA REPAIR (Bilateral: Abdomen)  Patient Location: PACU  Anesthesia Type:General  Level of Consciousness: awake, alert  and oriented  Airway & Oxygen Therapy: Patient Spontanous Breathing and Patient connected to face mask oxygen  Post-op Assessment: Report given to RN and Post -op Vital signs reviewed and stable  Post vital signs: Reviewed and stable  Last Vitals:  Vitals Value Taken Time  BP 114/72   Temp    Pulse 96 04/18/21 1020  Resp 17 04/18/21 1020  SpO2 100 % 04/18/21 1020  Vitals shown include unvalidated device data.  Last Pain:  Vitals:   04/18/21 0606  TempSrc: Oral         Complications: No notable events documented.

## 2021-04-18 NOTE — H&P (Signed)
04/18/2021     REFERRING PHYSICIAN: Davis Gourd*  Patient Care Team: Yevonne Pax, MD as PCP - Olena Mater, Adrian Saran, MD as Consulting Provider (General Surgery) Ceasar Mons, MD Meda Klinefelter., MD (Rheumatology)  PROVIDER: Hollace Kinnier, MD  [facility] MRN: [id number] DOB: 1946/04/11   Subjective   Chief Complaint: Inguinal Hernia   History of Present Illness: Christopher Pruitt is a 75 y.o. male who is seen today as an office consultation at the request of Dr. Lovena Neighbours for evaluation of No Inguinal hernia .   Moderately gentleman who underwent removal of a hydrocele in 2016 by Dr. Garfield Cornea with [facility]. Patient notes recurrent swelling on the left side and new swelling on the right side. Seen by Dr. Lovena Neighbours in the Urology group. Possible recurrent hydrocele. CT scan done. Suspicious for large left inguinal hernia. Surgical consultation offered. Patient has a history of prostate cancer. Looks like he was treated with radiation thousand 42 with no recurrence. He does have CT scans in 2007 and 2013 that revealed inguinal hernia. 2013 had a knuckle of small bowel. 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 Dr. Arman Filter.. Was have some urinary frequency and difficulty starting stream at times. Nocturia. He feels like his scrotum is gotten a lot larger. He is having worsening urinary incontinence and feels pressure. Wearing a diaper now. Has had some rashes. Diapers helped clean that up. He has never had any hernia surgery. He had a colonoscopy 2 years ago that was normal. He does have a decent family history of colorectal cancer though. Primary care wanting a repeat in 2025.  Patient does have sleep apnea. He  wears nasal CPAP. No supplemental oxygen. He used to smoke but has been abstinent for over 40 years. Question of borderline COPD. Often works out in [employer] an hour in the morning. However the groin swelling has made it more uncomfortable. No cardiac or pulmonary issues. He is a diabetic. Not on insulin. Had worsening joint pain and stiffness. Diagnosed with polymyalgia rheumatica. Has been on steroids. Over 20 mg prednisone. Now tapered down to 7.5mg . Walking better now.  Medical History: Past Medical History:  Diagnosis Date   Allergic rhinitis   COPD (chronic obstructive pulmonary disease) (CMS-HCC)   Diabetes mellitus type 2, uncomplicated (CMS-HCC)  non-insulin dependent   History of cataract   History of kidney stones   Hx of colonic polyps   Hyperlipidemia   Hypothyroidism   Lymphedema of left leg   Prostate cancer (CMS-HCC)  post cyber knife XRT 08/2012 (diagnosed in 2006)   Sleep apnea   Trigger finger   Patient Active Problem List  Diagnosis   COPD (chronic obstructive pulmonary disease) (CMS-HCC)   Prostate cancer (CMS-HCC)   Polyarticular gout   OSA on CPAP   Chronic right shoulder pain   Acquired hypothyroidism, unspecified   Family history of colon cancer   DM type 2 with diabetic mixed hyperlipidemia (CMS-HCC)   Medicare annual wellness visit, initial   CKD (chronic kidney disease) stage 3, GFR 30-59 ml/min (CMS-HCC)   Thrombocytopenia (CMS-HCC)   Hyperlipidemia, mixed   Status post left rotator cuff repair   Major depressive disorder, recurrent, mild (CMS-HCC)   Past Surgical History:  Procedure  Laterality Date   ARTHROSCOPIC ROTATOR CUFF REPAIR   COLONOSCOPY 10/15/2005  Dr. Ivor Messier @ [facility] w/high grade dysplasia, FHCC(m)   COLONOSCOPY 08/04/2013, 06/20/2009  Dr. Mamie Nick. Oh @ [facility], FHCC(m)(55), 5 yr rpt per [facility]   COLONOSCOPY 07/27/2018  Negative colon biopsy/FHx [facility] /TKT Dr. Alice Reichert wrote on his path report that no repeat was  necessary. Given his family hx of colon cancer (mother age 36), I would like for him to be placed in call back file for 5 years   Endoscopic left carpal tunnel release Left 10/25/2018  Dr.Poggi   Endoscopic right carpal tunnel release Right 02/01/2019  Dr.Poggi   Ingrown toenail excision   JOINT REPLACEMENT Right 07/03/2011  Right knee MAKOplasty.   TONSILLECTOMY  adenoidectomy    Allergies  Allergen Reactions   Amoxicillin-Pot Clavulanate Diarrhea and Nausea And Vomiting  Did it involve swelling of the face/tongue/throat, SOB, or low BP? No Did it involve sudden or severe rash/hives, skin peeling, or any reaction on the inside of your mouth or nose? No Did you need to seek medical attention at a hospital or doctor's office? No When did it last happen?      last year or so If all above answers are "NO", may proceed with cephalosporin use.   Nalfon [Fenoprofen] Rash   Oxycodone Other (See Comments)  Confusion   Robaxin [Methocarbamol] Unknown and Rash   Clindamycin Itching and Rash   Other Unknown  Muscle relaxants.   Current Outpatient Medications on File Prior to Visit  Medication Sig Dispense Refill   acetaminophen (TYLENOL) 325 MG tablet Take by mouth   allopurinoL (ZYLOPRIM) 300 MG tablet Take 1 tablet (300 mg total) by mouth once daily 90 tablet 3   FLUoxetine (PROZAC) 20 MG capsule Take 1 capsule (20 mg total) by mouth once daily 90 capsule 3   glipiZIDE (GLUCOTROL XL) 5 MG XL tablet Take 1 tablet (5 mg total) by mouth once daily 30 tablet 11   levothyroxine (EUTHYROX) 125 MCG tablet Take 1 tablet (125 mcg total) by mouth once daily Take on an empty stomach with a glass of water at least 30-60 minutes before breakfast. 90 tablet 3   lisinopriL (ZESTRIL) 40 MG tablet Take 1 tablet (40 mg total) by mouth once daily 90 tablet 3   metFORMIN (GLUCOPHAGE) 1000 MG tablet Take 1 tablet (1,000 mg total) by mouth 2 (two) times daily 180 tablet 3   omeprazole (PRILOSEC) 40 MG DR  capsule Take 1 capsule (40 mg total) by mouth once daily 90 capsule 3   ONETOUCH ULTRA TEST test strip USE 1 EACH (1 STRIP TOTAL) ONCE DAILY USE AS INSTRUCTED. 100 strip 3   oxybutynin (DITROPAN) 5 mg tablet Take 1 tablet (5 mg total) by mouth 3 (three) times daily 270 tablet 3   predniSONE (DELTASONE) 10 MG tablet Take 1 tablet (10 mg total) by mouth once daily for 30 days 30 tablet 0   predniSONE (DELTASONE) 5 MG tablet Take 2 tablets (10 mg total) by mouth once daily 60 tablet 1   rosuvastatin (CRESTOR) 20 MG tablet Take 1 tablet (20 mg total) by mouth once daily 90 tablet 3   Current Facility-Administered Medications on File Prior to Visit  Medication Dose Route Frequency Provider Last Rate Last Admin   cyanocobalamin (VITAMIN B12) injection 1,000 mcg 1,000 mcg Intramuscular Q30 Days Yevonne Pax, MD 1,000 mcg at 01/29/21 1045   Family History  Problem Relation Age of  Onset   Myocardial Infarction (Heart attack) Father   Diabetes Brother   Myocardial Infarction (Heart attack) Brother   Colorectal neoplasia Mother 1   Lymphoma Sister    Social History   Tobacco Use  Smoking Status Former Smoker   Types: Cigarettes  Smokeless Tobacco Never Used    Social History   Socioeconomic History   Marital status: Married  Occupational History   Occupation: retired  Tobacco Use   Smoking status: Former Smoker  Types: Cigarettes   Smokeless tobacco: Never Used  Scientific laboratory technician Use: Never used  Substance and Sexual Activity   Alcohol use: No  Alcohol/week: 0.0 standard drinks   Drug use: No   Sexual activity: Defer   ############################################################  Fraility Risk:  Preoperative Risks/Screening 1. Frailty Review:  Lives independently? yes Uses a mobility assist device (cane/walker/wheelchair)? no History of falls within 3 months? no Cognitive impairment/dementia? Borderline memory recall Age > 66? yes  2. Nutrition  Screening: Cancer/IBD? yes Age >65? yes Weight loss >10% in past 6 months? no If yes to any of the 3 above, consider Impact AR supplemental shake  3. PONV Screening: History of PONV? no Male under age of 35? no History of motion sickness? no  4. Chronic pain issues? no  5. Diabetes? yes Last HgbA1c:  Lab Results  Component Value Date  HGBA1C 7.6 (H) 01/22/2021   Due to patients above screening and past medical history they are at [place] increased risk of surgery  Review of Systems: A complete review of systems (ROS) was obtained from the patient. I have reviewed this information and discussed as appropriate with the patient. See HPI as well for other pertinent ROS.  Constitutional: No fevers, chills, sweats. Weight stable Eyes: No vision changes, No discharge HENT: No sore throats, nasal drainage Lymph: No neck swelling, No bruising easily Pulmonary: No cough, productive sputum CV: No orthopnea, PND Patient walks 20 minutes for about 1.5 miles without difficulty. No exertional chest/neck/shoulder/arm pain.  GI: No personal nor family history of inflammatory bowel disease, irritable bowel syndrome, allergy such as Celiac Sprue, dietary/dairy problems, colitis, ulcers nor gastritis. No recent sick contacts/gastroenteritis. No travel outside the country. No changes in diet. Mother I believe had colon cancer.  Renal: No UTIs, No hematuria Genital: No drainage, bleeding, masses Musculoskeletal: No severe joint pain. Good ROM major joints Skin: No sores or lesions Heme/Lymph: No easy bleeding. No swollen lymph nodes  Objective:   Vitals:  02/18/21 0943  Pulse: 94  Weight: 94 kg (207 lb 3.2 oz)  Height: 170.2 cm (5\' 7" )    Body mass index is 32.45 kg/m.  PHYSICAL EXAM:  Constitutional: Not cachectic. Hygeine adequate. Vitals signs as above.  Eyes: Pupils reactive, normal extraocular movements. Sclera nonicteric Neuro: CN II-XII intact. No major focal sensory defects.  No major motor deficits. Lymph: No head/neck/groin lymphadenopathy Psych: No severe agitation. No severe anxiety. Judgment & insight Adequate, Oriented x4, HENT: Normocephalic, Mucus membranes moist. No thrush. Very hard of hearing Neck: Supple, No tracheal deviation. No obvious thyromegaly Chest: No pain to chest wall compression. Good respiratory excursion. No audible wheezing CV: Pulses intact. Regular rhythm. No major extremity edema  Abdomen: Obese Hernia: Not present. Diastasis recti: Large supraumbilical midline. Soft. Nondistended. Nontender. No hepatomegaly. No splenomegaly  Gen: Inguinal hernia: Giant hernia going down into scrotum correlates with sigmoid colon. Incarcerated and not reducible. Testicle is distinct though. Uncircumcised male. Smaller but definite right inguinal hernia.. Inguinal lymph nodes: without lymphadenopathy.  Rectal: (Deferred)  Ext: No obvious deformity or contracture. Edema: Not present. No cyanosis Skin: No major subcutaneous nodules. Warm and dry Musculoskeletal: Severe joint rigidity not present. Some mild stiffness but uses major joints with decent handgrip. No obvious clubbing. No digital petechiae.   Labs, Imaging and Diagnostic Testing:  Located in [place]' section of Epic EMR chart  PRIOR NOTES   Not applicable  SURGERY NOTES:  Located in [place]' section of Epic EMR chart  PATHOLOGY:  Not applicable  Assessment and Plan:  DIAGNOSES:  Diagnoses and all orders for this visit:  Scrotal hernia  Right inguinal hernia  Obstructive sleep apnea on CPAP  Other specified hearing loss of both ears  Prostate cancer (CMS-HCC)  Personal history of immunosupression therapy  Urinary incontinence, unspecified type  History of diaper dermatitis  Diastasis recti  BMI 32.0-32.9,adult    ASSESSMENT/PLAN  Pleasant male with giant left scrotal inguinal hernia now incarcerated. CT scan notes: Within it. Small but  definite right inguinal hernia as well.  I think he would benefit from surgical repair. We will plan laparoscopic preperitoneal reduction and repair. With his giant hernia and prior pelvic radiation, he has a higher risk of needing possible open approach. We will see. I think he will need large sheets of mesh.   The anatomy & physiology of the abdominal wall and pelvic floor was discussed. The pathophysiology of hernias in the inguinal and pelvic region was discussed. Natural history risks such as progressive enlargement, pain, incarceration, and strangulation was discussed. Contributors to complications such as smoking, obesity, diabetes, prior surgery, etc were discussed.   I feel the risks of no intervention will lead to serious problems that outweigh the operative risks; therefore, I recommended surgery to reduce and repair the hernia. I explained laparoscopic techniques with possible need for an open approach. I noted usual use of mesh to patch and/or buttress hernia repair  Risks such as bleeding, infection, abscess, need for further treatment, injury to other organs, need for repair of tissues / organs, stroke, heart attack, death, and other risks were discussed. I noted a good likelihood this will help address the problem. Goals of post-operative recovery were discussed as well. Possibility that this will not correct all symptoms was explained. I stressed the importance of low-impact activity, aggressive pain control, avoiding constipation, & not pushing through pain to minimize risk of post-operative chronic pain or injury. Possibility of reherniation was discussed. We will work to minimize complications.   An educational handout further explaining the pathology & treatment options was given as well. Questions were answered. The patient expresses understanding & wishes to proceed with surgery.   Adin Hector, MD, FACS, MASCRS Esophageal, Gastrointestinal & Colorectal Surgery Robotic and  Minimally Invasive Surgery  [facility], [place]. [address], [place], [place] [address] [phone] Fax [phone] Main  CONTACT INFORMATION:  Weekday (9AM-5PM): Call [facility] main office at [phone]  Weeknight (5PM-9AM) or Weekend/Holiday: Check [email or web address] (password " [email or web address]") for General Surgery [facility] coverage  (Please, do not use SecureChat as it is not reliable communication to operating surgeons for immediate patient care)    04/18/2021

## 2021-04-18 NOTE — Discharge Instructions (Signed)
HERNIA REPAIR: POST OP INSTRUCTIONS  ######################################################################  EAT Gradually transition to a high fiber diet with a fiber supplement over the next few weeks after discharge.  Start with a pureed / full liquid diet (see below)  WALK Walk an hour a day.  Control your pain to do that.    CONTROL PAIN Control pain so that you can walk, sleep, tolerate sneezing/coughing, and go up/down stairs.  HAVE A BOWEL MOVEMENT DAILY Keep your bowels regular to avoid problems.  OK to try a laxative to override constipation.  OK to use an antidairrheal to slow down diarrhea.  Call if not better after 2 tries  CALL IF YOU HAVE PROBLEMS/CONCERNS Call if you are still struggling despite following these instructions. Call if you have concerns not answered by these instructions  ######################################################################    DIET: Follow a light bland diet & liquids the first 24 hours after arrival home, such as soup, liquids, starches, etc.  Be sure to drink plenty of fluids.  Quickly advance to a usual solid diet within a few days.  Avoid fast food or heavy meals as your are more likely to get nauseated or have irregular bowels.  A low-fat, high-fiber diet for the rest of your life is ideal.   Take your usually prescribed home medications unless otherwise directed.  PAIN CONTROL: Pain is best controlled by a usual combination of three different methods TOGETHER: Ice/Heat Over the counter pain medication Prescription pain medication Most patients will experience some swelling and bruising around the hernia(s) such as the bellybutton, groins, or old incisions.  Ice packs or heating pads (30-60 minutes up to 6 times a day) will help. Use ice for the first few days to help decrease swelling and bruising, then switch to heat to help relax tight/sore spots and speed recovery.  Some people prefer to use ice alone, heat alone, alternating  between ice & heat.  Experiment to what works for you.  Swelling and bruising can take several weeks to resolve.   It is helpful to take an over-the-counter pain medication regularly for the first few weeks.  Choose one of the following that works best for you: Naproxen (Aleve, etc)  Two 220mg tabs twice a day Ibuprofen (Advil, etc) Three 200mg tabs four times a day (every meal & bedtime) Acetaminophen (Tylenol, etc) 325-650mg four times a day (every meal & bedtime) A  prescription for pain medication should be given to you upon discharge.  Take your pain medication as prescribed.  If you are having problems/concerns with the prescription medicine (does not control pain, nausea, vomiting, rash, itching, etc), please call us [phone] to see if we need to switch you to a different pain medicine that will work better for you and/or control your side effect better. If you need a refill on your pain medication, please contact your pharmacy.  They will contact our office to request authorization. Prescriptions will not be filled after 5 pm or on week-ends.  Avoid getting constipated.  Between the surgery and the pain medications, it is common to experience some constipation.  Increasing fluid intake and taking a fiber supplement (such as Metamucil, Citrucel, FiberCon, MiraLax, etc) 1-2 times a day regularly will usually help prevent this problem from occurring.  A mild laxative (prune juice, Milk of Magnesia, MiraLax, etc) should be taken according to package directions if there are no bowel movements after 48 hours.    Wash / shower every day.  You may shower over the dressings   as they are waterproof.    Remove your waterproof bandages, skin tapes, and other bandages 3 days after surgery. You may replace a dressing/Band-Aid to cover the incision for comfort if you wish. You may leave the incisions open to air.  You may replace a dressing/Band-Aid to cover an incision for comfort if you wish.  Continue  to shower over incision(s) after the dressing is off.  ACTIVITIES as tolerated:   You may resume regular (light) daily activities beginning the next day--such as daily self-care, walking, climbing stairs--gradually increasing activities as tolerated.  Control your pain so that you can walk an hour a day.  If you can walk 30 minutes without difficulty, it is safe to try more intense activity such as jogging, treadmill, bicycling, low-impact aerobics, swimming, etc. Save the most intensive and strenuous activity for last such as sit-ups, heavy lifting, contact sports, etc  Refrain from any heavy lifting or straining until you are off narcotics for pain control.   DO NOT PUSH THROUGH PAIN.  Let pain be your guide: If it hurts to do something, don't do it.  Pain is your body warning you to avoid that activity for another week until the pain goes down. You may drive when you are no longer taking prescription pain medication, you can comfortably wear a seatbelt, and you can safely maneuver your car and apply brakes. You may have sexual intercourse when it is comfortable.   FOLLOW UP in our office Please call [facility] at [phone] to set up an appointment to see your surgeon in the office for a follow-up appointment approximately 2-3 weeks after your surgery. Make sure that you call for this appointment the day you arrive home to insure a convenient appointment time.  9.  If you have disability of FMLA / Family leave forms, please bring the forms to the office for processing.  (do not give to your surgeon).  WHEN TO CALL US [phone]: Poor pain control Reactions / problems with new medications (rash/itching, nausea, etc)  Fever over 101.5 F (38.5 C) Inability to urinate Nausea and/or vomiting Worsening swelling or bruising Continued bleeding from incision. Increased pain, redness, or drainage from the incision   The clinic staff is available to answer your questions during regular business  hours (8:30am-5pm).  Please don't hesitate to call and ask to speak to one of our nurses for clinical concerns.   If you have a medical emergency, go to the nearest emergency room or call 911.  A surgeon from [facility] is always on call at the hospitals in [place]  [facility] [address], [address], [place], [place]  [address] ?  [address] [address], [place],    [address] MAIN: [phone] ? TOLL FREE: [phone] ? FAX: [phone] [email or web address]  

## 2021-04-18 NOTE — Anesthesia Postprocedure Evaluation (Signed)
Anesthesia Post Note  Patient: Christopher Pruitt  Procedure(s) Performed: LAPAROSCOPIC BILATERAL  HERNIA REPAIR WITH MESH, TRANSABDOMINAL PLANE BLOCK, LEFT INCARCARATED SCROTAL HERNIA, RIGHT INGUINAL HERNIA REPAIR (Bilateral: Abdomen)     Patient location during evaluation: PACU Anesthesia Type: General Level of consciousness: awake and alert and oriented Pain management: pain level controlled Vital Signs Assessment: post-procedure vital signs reviewed and stable Respiratory status: spontaneous breathing, nonlabored ventilation and respiratory function stable Cardiovascular status: blood pressure returned to baseline Postop Assessment: no apparent nausea or vomiting Anesthetic complications: no   No notable events documented.  Last Vitals:  Vitals:   04/18/21 1045 04/18/21 1100  BP: 132/74 128/74  Pulse: 96 94  Resp: 17 16  Temp:    SpO2: 92% 93%    Last Pain:  Vitals:   04/18/21 1018  TempSrc:   PainSc: Asleep   Pain Goal:                   Marthenia Rolling

## 2021-04-18 NOTE — Anesthesia Procedure Notes (Signed)
Procedure Name: Intubation Date/Time: 04/18/2021 7:35 AM Performed by: Genelle Bal, CRNA Pre-anesthesia Checklist: Patient identified, Emergency Drugs available, Suction available and Patient being monitored Patient Re-evaluated:Patient Re-evaluated prior to induction Oxygen Delivery Method: Circle system utilized Preoxygenation: Pre-oxygenation with 100% oxygen Induction Type: IV induction Ventilation: Mask ventilation without difficulty Laryngoscope Size: Miller and 2 Grade View: Grade I Tube type: Oral Tube size: 7.5 mm Number of attempts: 1 Airway Equipment and Method: Stylet and Oral airway Placement Confirmation: ETT inserted through vocal cords under direct vision, positive ETCO2 and breath sounds checked- equal and bilateral Secured at: 22 cm Tube secured with: Tape Dental Injury: Teeth and Oropharynx as per pre-operative assessment

## 2021-04-21 ENCOUNTER — Encounter (HOSPITAL_COMMUNITY): Payer: Self-pay | Admitting: Surgery

## 2021-07-01 ENCOUNTER — Encounter (INDEPENDENT_AMBULATORY_CARE_PROVIDER_SITE_OTHER): Payer: Medicare Other | Admitting: Ophthalmology

## 2021-07-01 ENCOUNTER — Other Ambulatory Visit: Payer: Self-pay

## 2021-07-01 DIAGNOSIS — H35371 Puckering of macula, right eye: Secondary | ICD-10-CM | POA: Diagnosis not present

## 2021-07-01 DIAGNOSIS — I10 Essential (primary) hypertension: Secondary | ICD-10-CM

## 2021-07-01 DIAGNOSIS — H43821 Vitreomacular adhesion, right eye: Secondary | ICD-10-CM

## 2021-07-01 DIAGNOSIS — H35033 Hypertensive retinopathy, bilateral: Secondary | ICD-10-CM | POA: Diagnosis not present

## 2021-10-03 ENCOUNTER — Other Ambulatory Visit: Payer: Self-pay | Admitting: Specialist

## 2021-10-03 DIAGNOSIS — R053 Chronic cough: Secondary | ICD-10-CM

## 2021-10-03 DIAGNOSIS — J849 Interstitial pulmonary disease, unspecified: Secondary | ICD-10-CM

## 2021-10-09 ENCOUNTER — Ambulatory Visit
Admission: RE | Admit: 2021-10-09 | Discharge: 2021-10-09 | Disposition: A | Discharge status: Home / Self Care | Payer: Medicare Other | Source: Ambulatory Visit | Attending: Specialist | Admitting: Specialist

## 2021-10-09 DIAGNOSIS — R053 Chronic cough: Secondary | ICD-10-CM

## 2021-10-09 DIAGNOSIS — J849 Interstitial pulmonary disease, unspecified: Secondary | ICD-10-CM

## 2021-12-29 ENCOUNTER — Encounter (INDEPENDENT_AMBULATORY_CARE_PROVIDER_SITE_OTHER): Payer: Medicare Other | Admitting: Ophthalmology

## 2022-01-06 ENCOUNTER — Encounter (INDEPENDENT_AMBULATORY_CARE_PROVIDER_SITE_OTHER): Payer: Medicare Other | Admitting: Ophthalmology

## 2022-01-06 DIAGNOSIS — H43821 Vitreomacular adhesion, right eye: Secondary | ICD-10-CM | POA: Diagnosis not present

## 2022-01-06 DIAGNOSIS — I10 Essential (primary) hypertension: Secondary | ICD-10-CM

## 2022-01-06 DIAGNOSIS — H43813 Vitreous degeneration, bilateral: Secondary | ICD-10-CM

## 2022-01-06 DIAGNOSIS — H35371 Puckering of macula, right eye: Secondary | ICD-10-CM | POA: Diagnosis not present

## 2022-01-06 DIAGNOSIS — H35033 Hypertensive retinopathy, bilateral: Secondary | ICD-10-CM

## 2022-01-08 ENCOUNTER — Emergency Department
Admission: EM | Admit: 2022-01-08 | Discharge: 2022-01-08 | Disposition: A | Discharge status: Home / Self Care | Payer: Medicare Other | Attending: Emergency Medicine | Admitting: Emergency Medicine

## 2022-01-08 ENCOUNTER — Other Ambulatory Visit: Payer: Self-pay

## 2022-01-08 ENCOUNTER — Emergency Department: Payer: Medicare Other

## 2022-01-08 DIAGNOSIS — Z043 Encounter for examination and observation following other accident: Secondary | ICD-10-CM | POA: Diagnosis present

## 2022-01-08 DIAGNOSIS — J449 Chronic obstructive pulmonary disease, unspecified: Secondary | ICD-10-CM | POA: Diagnosis not present

## 2022-01-08 DIAGNOSIS — W01198A Fall on same level from slipping, tripping and stumbling with subsequent striking against other object, initial encounter: Secondary | ICD-10-CM | POA: Diagnosis not present

## 2022-01-08 DIAGNOSIS — I10 Essential (primary) hypertension: Secondary | ICD-10-CM | POA: Insufficient documentation

## 2022-01-08 DIAGNOSIS — S0990XA Unspecified injury of head, initial encounter: Secondary | ICD-10-CM | POA: Insufficient documentation

## 2022-01-08 DIAGNOSIS — E039 Hypothyroidism, unspecified: Secondary | ICD-10-CM | POA: Insufficient documentation

## 2022-01-08 DIAGNOSIS — W19XXXA Unspecified fall, initial encounter: Secondary | ICD-10-CM

## 2022-01-08 DIAGNOSIS — E119 Type 2 diabetes mellitus without complications: Secondary | ICD-10-CM | POA: Insufficient documentation

## 2022-01-08 NOTE — ED Provider Notes (Signed)
[facility] Provider Note  Patient Contact: 4:11 PM (approximate)   History   Fall   HPI  Christopher Pruitt is a 76 y.o. male with a history of hypertension, hypothyroidism, diabetes, COPD, lymphedema and anemia, presents to the emergency department after patient was attempting to hang a shower curtain.  Patient states that 1 foot was off the ladder and he was almost to standing height when he lost his balance and fell backwards against the wall which consists of a ceramic tile surface.  Patient states that he did not lose consciousness but became nauseated immediately and feels off balance.  He denies weakness in the upper and lower extremities.  No chest pain or abdominal pain.  He states that he is not currently taking a blood thinner.      Physical Exam   Triage Vital Signs: ED Triage Vitals  Enc Vitals Group     BP 01/08/22 1551 (!) 147/89     Pulse Rate 01/08/22 1551 81     Resp 01/08/22 1551 18     Temp 01/08/22 1551 98.3 F (36.8 C)     Temp Source 01/08/22 1551 Oral     SpO2 01/08/22 1551 96 %     Weight 01/08/22 1553 215 lb (97.5 kg)     Height 01/08/22 1553 '5\' 6"'$  (1.676 m)     Head Circumference --      Peak Flow --      Pain Score 01/08/22 1553 3     Pain Loc --      Pain Edu? --      Excl. in [place]? --     Most recent vital signs: Vitals:   01/08/22 1551  BP: (!) 147/89  Pulse: 81  Resp: 18  Temp: 98.3 F (36.8 C)  SpO2: 96%     General: Alert and in no acute distress. Eyes:  PERRL. EOMI. Head: No acute traumatic findings ENT:      Nose: No congestion/rhinnorhea.      Mouth/Throat: Mucous membranes are moist. Neck: No stridor. No cervical spine tenderness to palpation. Cardiovascular:  Good peripheral perfusion Respiratory: Normal respiratory effort without tachypnea or retractions. Lungs CTAB. Good air entry to the bases with no decreased or absent breath sounds. Gastrointestinal: Bowel sounds 4 quadrants. Soft and  nontender to palpation. No guarding or rigidity. No palpable masses. No distention. No CVA tenderness. Musculoskeletal: Some grip strength.  Full range of motion to all extremities.  Neuro: Cranial nerves II through XII are intact.  No deficits in sensation. Neurologic:  No gross focal neurologic deficits are appreciated.  Skin:   No rash noted Other:   ED Results / Procedures / Treatments   Labs (all labs ordered are listed, but only abnormal results are displayed) Labs Reviewed - No data to display      RADIOLOGY  I personally viewed and evaluated these images as part of my medical decision making, as well as reviewing the written report by the radiologist.  ED Provider Interpretation: No evidence of intracranial bleed or C-spine fracture.   PROCEDURES:  Critical Care performed: No  Procedures   MEDICATIONS ORDERED IN ED: Medications - No data to display   IMPRESSION / MDM / [place] / ED COURSE  I reviewed the triage vital signs and the nursing notes.  Assessment and plan: Fall 76 year old male presents to the emergency department after a mechanical fall.  Vital signs are reassuring at triage.  On exam, patient was alert and nontoxic-appearing.  There was no evidence of intracranial bleed or skull fracture on dedicated CTs.  Recommended Tylenol as needed for discomfort.  Return precautions were given to return with disorientation, confusion, multiple episodes of vomiting or other new or worsening symptoms.     FINAL CLINICAL IMPRESSION(S) / ED DIAGNOSES   Final diagnoses:  Fall, initial encounter     Rx / DC Orders   ED Discharge Orders     None        Note:  This document was prepared using Dragon voice recognition software and may include unintentional dictation errors.   Vallarie Mare [place], PA-C 01/08/22 1655    Rada Hay, MD 01/08/22 1725

## 2022-01-08 NOTE — ED Triage Notes (Signed)
Pt states he was on a step ladder hanging up a shower curtain and when he got down it scooted out from under him he fell backwards and hit his head- pt denies loss of consciousness- pt denies blood thinner use

## 2022-01-08 NOTE — ED Notes (Signed)
Pt states he was hanging a shower curtain today with use of a small stool. When he was climbing down, the stool slipped out from under him and he fell backwards, hitting the back of his head on the ceramic tile. Denies LOC but states that he was "stunned" for a few moments and felt nauseated afterwards with some temporary blurred vision since resolved. No blood thinners. Hematoma noted to occipital area but no open wound. A/O x 4, GCS 15

## 2022-02-10 ENCOUNTER — Ambulatory Visit (INDEPENDENT_AMBULATORY_CARE_PROVIDER_SITE_OTHER): Payer: Medicare Other | Admitting: Dermatology

## 2022-02-10 DIAGNOSIS — L578 Other skin changes due to chronic exposure to nonionizing radiation: Secondary | ICD-10-CM | POA: Diagnosis not present

## 2022-02-10 DIAGNOSIS — C44629 Squamous cell carcinoma of skin of left upper limb, including shoulder: Secondary | ICD-10-CM

## 2022-02-10 DIAGNOSIS — D485 Neoplasm of uncertain behavior of skin: Secondary | ICD-10-CM

## 2022-02-10 DIAGNOSIS — C4490 Unspecified malignant neoplasm of skin, unspecified: Secondary | ICD-10-CM

## 2022-02-10 HISTORY — DX: Unspecified malignant neoplasm of skin, unspecified: C44.90

## 2022-02-10 NOTE — Progress Notes (Signed)
   Follow-Up Visit   Subjective  Christopher Pruitt is a 76 y.o. male who presents for the following: Other (Spot on left arm x several months. Treated with LN2 by Dr. Emily Filbert 7-10 days ago). The patient has spots, moles and lesions to be evaluated, some may be new or changing and the patient has concerns that these could be cancer.  Referred by Dr. Emily Filbert  The following portions of the chart were reviewed this encounter and updated as appropriate:   Tobacco  Allergies  Meds  Problems  Med Hx  Surg Hx  Fam Hx     Review of Systems:  No other skin or systemic complaints except as noted in HPI or Assessment and Plan.  Objective  Well appearing patient in no apparent distress; mood and affect are within normal limits.  A focused examination was performed including left arm. Relevant physical exam findings are noted in the Assessment and Plan.  Left lat elbow 1.2 x 1.0 cm ulcerated plaque   Assessment & Plan  Neoplasm of uncertain behavior of skin Left lat elbow  Epidermal / dermal shaving  Lesion diameter (cm):  1.2 Informed consent: discussed and consent obtained   Timeout: patient name, date of birth, surgical site, and procedure verified   Procedure prep:  Patient was prepped and draped in usual sterile fashion Prep type:  Isopropyl alcohol Anesthesia: the lesion was anesthetized in a standard fashion   Anesthetic:  1% lidocaine w/ epinephrine 1-100,000 buffered w/ 8.4% NaHCO3 Instrument used: flexible razor blade   Hemostasis achieved with: pressure, aluminum chloride and electrodesiccation   Outcome: patient tolerated procedure well   Post-procedure details: sterile dressing applied and wound care instructions given   Dressing type: bandage and petrolatum    Destruction of lesion Complexity: extensive   Destruction method: electrodesiccation and curettage   Informed consent: discussed and consent obtained   Timeout:  patient name, date of birth, surgical  site, and procedure verified Procedure prep:  Patient was prepped and draped in usual sterile fashion Prep type:  Isopropyl alcohol Anesthesia: the lesion was anesthetized in a standard fashion   Anesthetic:  1% lidocaine w/ epinephrine 1-100,000 buffered w/ 8.4% NaHCO3 Curettage performed in three different directions: Yes   Electrodesiccation performed over the curetted area: Yes   Lesion length (cm):  1.2 Lesion width (cm):  1.2 Margin per side (cm):  0.2 Final wound size (cm):  1.6 Hemostasis achieved with:  pressure and aluminum chloride Outcome: patient tolerated procedure well with no complications   Post-procedure details: sterile dressing applied and wound care instructions given   Dressing type: bandage and petrolatum    Specimen 1 - Surgical pathology Differential Diagnosis: BCC vs other  Check Margins: No EDC today  Actinic Damage - chronic, secondary to cumulative UV radiation exposure/sun exposure over time - diffuse scaly erythematous macules with underlying dyspigmentation - Recommend daily broad spectrum sunscreen SPF 30+ to sun-exposed areas, reapply every 2 hours as needed.  - Recommend staying in the shade or wearing long sleeves, sun glasses (UVA+UVB protection) and wide brim hats (4-inch brim around the entire circumference of the hat). - Call for new or changing lesions.  Return in about 4 months (around 06/13/2022) for Biopsy Follow up.  I, Ashok Cordia, CMA, am acting as scribe for Sarina Ser, MD . Documentation: I have reviewed the above documentation for accuracy and completeness, and I agree with the above.  Sarina Ser, MD

## 2022-02-10 NOTE — Patient Instructions (Signed)
[facility]   Wound Care Instructions  Cleanse wound gently with soap and water once a day then pat dry with clean gauze. Apply a thin coat of Petrolatum (petroleum jelly, "Vaseline") over the wound (unless you have an allergy to this). We recommend that you use a new, sterile tube of Vaseline. Do not pick or remove scabs. Do not remove the yellow or white "healing tissue" from the base of the wound.  Cover the wound with fresh, clean, nonstick gauze and secure with paper tape. You may use Band-Aids in place of gauze and tape if the wound is small enough, but would recommend trimming much of the tape off as there is often too much. Sometimes Band-Aids can irritate the skin.  You should call the office for your biopsy report after 1 week if you have not already been contacted.  If you experience any problems, such as abnormal amounts of bleeding, swelling, significant bruising, significant pain, or evidence of infection, please call the office immediately.  FOR ADULT SURGERY PATIENTS: If you need something for pain relief you may take 1 extra strength Tylenol (acetaminophen) AND 2 Ibuprofen ('200mg'$  each) together every 4 hours as needed for pain. (do not take these if you are allergic to them or if you have a reason you should not take them.) Typically, you may only need pain medication for 1 to 3 days.     Due to recent changes in healthcare laws, you may see results of your pathology and/or laboratory studies on MyChart before the doctors have had a chance to review them. We understand that in some cases there may be results that are confusing or concerning to you. Please understand that not all results are received at the same time and often the doctors may need to interpret multiple results in order to provide you with the best plan of care or course of treatment. Therefore, we ask that you please give [place] 2 business days to thoroughly review all your results before contacting the  office for clarification. Should we see a critical lab result, you will be contacted sooner.   If You Need Anything After Your Visit  If you have any questions or concerns for your doctor, please call our main line at [phone] and press option 4 to reach your doctor's medical assistant. If no one answers, please leave a voicemail as directed and we will return your call as soon as possible. Messages left after 4 pm will be answered the following business day.   You may also send [place] a message via [place]. We typically respond to MyChart messages within 1-2 business days.  For prescription refills, please ask your pharmacy to contact our office. Our fax number is [phone].  If you have an urgent issue when the clinic is closed that cannot wait until the next business day, you can page your doctor at the number below.    Please note that while we do our best to be available for urgent issues outside of office hours, we are not available 24/7.   If you have an urgent issue and are unable to reach [place], you may choose to seek medical care at your doctor's office, retail clinic, urgent care center, or emergency room.  If you have a medical emergency, please immediately call 911 or go to the emergency department.  Pager Numbers  - Dr. Nehemiah Massed: [phone]  - Dr. Laurence Ferrari: [phone]  - Dr. Nicole Kindred: [phone]  In the event of inclement weather,  please call our main line at [phone] for an update on the status of any delays or closures.  Dermatology Medication Tips: Please keep the boxes that topical medications come in in order to help keep track of the instructions about where and how to use these. Pharmacies typically print the medication instructions only on the boxes and not directly on the medication tubes.   If your medication is too expensive, please contact our office at [phone] option 4 or send [place] a message through [place].   We are unable to tell what your co-pay  for medications will be in advance as this is different depending on your insurance coverage. However, we may be able to find a substitute medication at lower cost or fill out paperwork to get insurance to cover a needed medication.   If a prior authorization is required to get your medication covered by your insurance company, please allow [place] 1-2 business days to complete this process.  Drug prices often vary depending on where the prescription is filled and some pharmacies may offer cheaper prices.  The website www.goodrx.com contains coupons for medications through different pharmacies. The prices here do not account for what the cost may be with help from insurance (it may be cheaper with your insurance), but the website can give you the price if you did not use any insurance.  - You can print the associated coupon and take it with your prescription to the pharmacy.  - You may also stop by our office during regular business hours and pick up a GoodRx coupon card.  - If you need your prescription sent electronically to a different pharmacy, notify our office through [facility] or by phone at [phone] option 4.     Si Usted Necesita Algo Despus de Su Visita  Tambin puede enviarnos un mensaje a travs de Pharmacist, community. Por lo general respondemos a los mensajes de MyChart en el transcurso de 1 a 2 das hbiles.  Para renovar recetas, por favor pida a su farmacia que se ponga en contacto con nuestra oficina. Harland Dingwall de fax es [place] [phone].  Si tiene un asunto urgente cuando la clnica est cerrada y que no puede esperar hasta el siguiente da hbil, puede llamar/localizar a su doctor(a) al nmero que aparece a continuacin.   Por favor, tenga en cuenta que aunque hacemos todo lo posible para estar disponibles para asuntos urgentes fuera del horario de [place], no estamos disponibles las 24 horas del da, los 7 das de la [place].   Si tiene un problema urgente y no puede  comunicarse con nosotros, puede optar por buscar atencin mdica  en el consultorio de su doctor(a), en una clnica privada, en un centro de atencin urgente o en una sala de emergencias.  Si tiene Engineering geologist, por favor llame inmediatamente al 911 o vaya a la sala de emergencias.  Nmeros de bper  - Dr. Nehemiah Massed: [phone]  - Dra. Moye: [phone]  - Dra. Nicole Kindred: [phone]  En caso de inclemencias del [place], por favor llame a Johnsie Kindred principal al [phone] para una actualizacin sobre el [place] de cualquier retraso o cierre.  Consejos para la medicacin en dermatologa: Por favor, guarde las cajas en las que vienen los medicamentos de uso tpico para ayudarle a seguir las instrucciones sobre dnde y cmo usarlos. Las farmacias generalmente imprimen las instrucciones del medicamento slo en las cajas y no directamente en los tubos del [place].   Si su medicamento es [employer],  por favor, pngase en contacto con nuestra oficina llamando al [phone] y presione la opcin 4 o envenos un mensaje a travs de Pharmacist, community.   No podemos decirle cul ser su copago por los medicamentos por adelantado ya que esto es diferente dependiendo de la cobertura de su seguro. Sin embargo, es posible que podamos encontrar un medicamento sustituto a Electrical engineer un formulario para que el seguro cubra el medicamento que se considera necesario.   Si se requiere una autorizacin previa para que su compaa de seguros [place] su medicamento, por favor permtanos de 1 a 2 das hbiles para completar este proceso.  Los precios de los medicamentos varan con frecuencia dependiendo del Environmental consultant de dnde se surte la receta y alguna farmacias pueden ofrecer precios ms baratos.  El sitio web www.goodrx.com tiene cupones para medicamentos de Airline pilot. Los precios aqu no tienen en cuenta lo que podra costar con la ayuda del seguro (puede ser ms barato con su seguro), pero  el sitio web puede darle el precio si no utiliz Research scientist (physical sciences).  - Puede imprimir el cupn correspondiente y llevarlo con su receta a la farmacia.  - Tambin puede pasar por nuestra oficina durante el horario de atencin regular y Charity fundraiser una tarjeta de cupones de GoodRx.  - Si necesita que su receta se enve electrnicamente a una farmacia diferente, informe a nuestra oficina a travs de MyChart de [place] o por telfono llamando al [phone] y presione la opcin 4.

## 2022-02-11 ENCOUNTER — Encounter: Payer: Self-pay | Admitting: Dermatology

## 2022-02-12 ENCOUNTER — Telehealth: Payer: Self-pay

## 2022-02-12 NOTE — Telephone Encounter (Signed)
Discussed pathology results with patient and wife.

## 2022-02-12 NOTE — Telephone Encounter (Signed)
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

## 2022-02-24 ENCOUNTER — Ambulatory Visit (INDEPENDENT_AMBULATORY_CARE_PROVIDER_SITE_OTHER): Payer: Medicare Other | Admitting: Dermatology

## 2022-02-24 DIAGNOSIS — Z85828 Personal history of other malignant neoplasm of skin: Secondary | ICD-10-CM

## 2022-02-24 MED ORDER — MUPIROCIN 2 % EX OINT: 1.0000 | TOPICAL_OINTMENT | Freq: Every day | CUTANEOUS | 0 refills | Status: DC

## 2022-02-24 NOTE — Progress Notes (Signed)
   Follow-Up Visit   Subjective  Christopher Pruitt is a 76 y.o. male who presents for the following: Other (Recheck biopsy site of left lat elbow - shave removal and edc 02/10/22 (SCC/BCC)).  The following portions of the chart were reviewed this encounter and updated as appropriate:   Tobacco  Allergies  Meds  Problems  Med Hx  Surg Hx  Fam Hx     Review of Systems:  No other skin or systemic complaints except as noted in HPI or Assessment and Plan.  Objective  Well appearing patient in no apparent distress; mood and affect are within normal limits.  A focused examination was performed including left arm. Relevant physical exam findings are noted in the Assessment and Plan.  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 Area cleaned with Puracyn. Mupirocin and bandaid applied.   Start Mupirocin oint qd  mupirocin ointment (BACTROBAN) 2 % - Left lat elbow Apply 1 Application topically daily. With dressing changes   Return for Follow up as scheduled.  I, Ashok Cordia, CMA, am acting as scribe for Sarina Ser, MD . Documentation: I have reviewed the above documentation for accuracy and completeness, and I agree with the above.  Sarina Ser, MD

## 2022-02-24 NOTE — Patient Instructions (Signed)
Due to recent changes in healthcare laws, you may see results of your pathology and/or laboratory studies on MyChart before the doctors have had a chance to review them. We understand that in some cases there may be results that are confusing or concerning to you. Please understand that not all results are received at the same time and often the doctors may need to interpret multiple results in order to provide you with the best plan of care or course of treatment. Therefore, we ask that you please give us 2 business days to thoroughly review all your results before contacting the office for clarification. Should we see a critical lab result, you will be contacted sooner.   If You Need Anything After Your Visit  If you have any questions or concerns for your doctor, please call our main line at [phone] and press option 4 to reach your doctor's medical assistant. If no one answers, please leave a voicemail as directed and we will return your call as soon as possible. Messages left after 4 pm will be answered the following business day.   You may also send us a message via MyChart. We typically respond to MyChart messages within 1-2 business days.  For prescription refills, please ask your pharmacy to contact our office. Our fax number is [phone].  If you have an urgent issue when the clinic is closed that cannot wait until the next business day, you can page your doctor at the number below.    Please note that while we do our best to be available for urgent issues outside of office hours, we are not available 24/7.   If you have an urgent issue and are unable to reach us, you may choose to seek medical care at your doctor's office, retail clinic, urgent care center, or emergency room.  If you have a medical emergency, please immediately call 911 or go to the emergency department.  Pager Numbers  - Dr. Kowalski: [phone]  - Dr. Moye: [phone]  - Dr. Stewart:  [phone]  In the event of inclement weather, please call our main line at [phone] for an update on the status of any delays or closures.  Dermatology Medication Tips: Please keep the boxes that topical medications come in in order to help keep track of the instructions about where and how to use these. Pharmacies typically print the medication instructions only on the boxes and not directly on the medication tubes.   If your medication is too expensive, please contact our office at [phone] option 4 or send us a message through MyChart.   We are unable to tell what your co-pay for medications will be in advance as this is different depending on your insurance coverage. However, we may be able to find a substitute medication at lower cost or fill out paperwork to get insurance to cover a needed medication.   If a prior authorization is required to get your medication covered by your insurance company, please allow us 1-2 business days to complete this process.  Drug prices often vary depending on where the prescription is filled and some pharmacies may offer cheaper prices.  The website www.goodrx.com contains coupons for medications through different pharmacies. The prices here do not account for what the cost may be with help from insurance (it may be cheaper with your insurance), but the website can give you the price if you did not use any insurance.  - You can print the associated coupon and take it with   your prescription to the pharmacy.  - You may also stop by our office during regular business hours and pick up a GoodRx coupon card.  - If you need your prescription sent electronically to a different pharmacy, notify our office through [facility] MyChart or by phone at [phone] option 4.     Si Usted Necesita Algo Despus de Su Visita  Tambin puede enviarnos un mensaje a travs de MyChart. Por lo general respondemos a los mensajes de MyChart en el transcurso de 1 a 2  das hbiles.  Para renovar recetas, por favor pida a su farmacia que se ponga en contacto con nuestra oficina. Nuestro nmero de fax es el [phone].  Si tiene un asunto urgente cuando la clnica est cerrada y que no puede esperar hasta el siguiente da hbil, puede llamar/localizar a su doctor(a) al nmero que aparece a continuacin.   Por favor, tenga en cuenta que aunque hacemos todo lo posible para estar disponibles para asuntos urgentes fuera del horario de oficina, no estamos disponibles las 24 horas del da, los 7 das de la semana.   Si tiene un problema urgente y no puede comunicarse con nosotros, puede optar por buscar atencin mdica  en el consultorio de su doctor(a), en una clnica privada, en un centro de atencin urgente o en una sala de emergencias.  Si tiene una emergencia mdica, por favor llame inmediatamente al 911 o vaya a la sala de emergencias.  Nmeros de bper  - Dr. Kowalski: [phone]  - Dra. Moye: [phone]  - Dra. Stewart: [phone]  En caso de inclemencias del tiempo, por favor llame a nuestra lnea principal al [phone] para una actualizacin sobre el estado de cualquier retraso o cierre.  Consejos para la medicacin en dermatologa: Por favor, guarde las cajas en las que vienen los medicamentos de uso tpico para ayudarle a seguir las instrucciones sobre dnde y cmo usarlos. Las farmacias generalmente imprimen las instrucciones del medicamento slo en las cajas y no directamente en los tubos del medicamento.   Si su medicamento es muy caro, por favor, pngase en contacto con nuestra oficina llamando al [phone] y presione la opcin 4 o envenos un mensaje a travs de MyChart.   No podemos decirle cul ser su copago por los medicamentos por adelantado ya que esto es diferente dependiendo de la cobertura de su seguro. Sin embargo, es posible que podamos encontrar un medicamento sustituto a menor costo o llenar un formulario para que el  seguro cubra el medicamento que se considera necesario.   Si se requiere una autorizacin previa para que su compaa de seguros cubra su medicamento, por favor permtanos de 1 a 2 das hbiles para completar este proceso.  Los precios de los medicamentos varan con frecuencia dependiendo del lugar de dnde se surte la receta y alguna farmacias pueden ofrecer precios ms baratos.  El sitio web www.goodrx.com tiene cupones para medicamentos de diferentes farmacias. Los precios aqu no tienen en cuenta lo que podra costar con la ayuda del seguro (puede ser ms barato con su seguro), pero el sitio web puede darle el precio si no utiliz ningn seguro.  - Puede imprimir el cupn correspondiente y llevarlo con su receta a la farmacia.  - Tambin puede pasar por nuestra oficina durante el horario de atencin regular y recoger una tarjeta de cupones de GoodRx.  - Si necesita que su receta se enve electrnicamente a una farmacia diferente, informe a nuestra oficina a travs de MyChart de [place]   o por telfono llamando al [phone] y presione la opcin 4.  

## 2022-03-06 ENCOUNTER — Encounter: Payer: Self-pay | Admitting: Dermatology

## 2022-06-17 ENCOUNTER — Ambulatory Visit: Payer: No Typology Code available for payment source | Admitting: Dermatology

## 2022-07-08 ENCOUNTER — Encounter (INDEPENDENT_AMBULATORY_CARE_PROVIDER_SITE_OTHER): Payer: Medicare Other | Admitting: Ophthalmology

## 2022-07-08 DIAGNOSIS — H43821 Vitreomacular adhesion, right eye: Secondary | ICD-10-CM

## 2022-07-08 DIAGNOSIS — I10 Essential (primary) hypertension: Secondary | ICD-10-CM | POA: Diagnosis not present

## 2022-07-08 DIAGNOSIS — H43813 Vitreous degeneration, bilateral: Secondary | ICD-10-CM

## 2022-07-08 DIAGNOSIS — H35033 Hypertensive retinopathy, bilateral: Secondary | ICD-10-CM

## 2022-07-14 ENCOUNTER — Ambulatory Visit (INDEPENDENT_AMBULATORY_CARE_PROVIDER_SITE_OTHER): Payer: Medicare Other | Admitting: Dermatology

## 2022-07-14 VITALS — BP 149/81 | HR 88

## 2022-07-14 DIAGNOSIS — D692 Other nonthrombocytopenic purpura: Secondary | ICD-10-CM

## 2022-07-14 DIAGNOSIS — Z85828 Personal history of other malignant neoplasm of skin: Secondary | ICD-10-CM | POA: Diagnosis not present

## 2022-07-14 DIAGNOSIS — L570 Actinic keratosis: Secondary | ICD-10-CM | POA: Diagnosis not present

## 2022-07-14 DIAGNOSIS — L820 Inflamed seborrheic keratosis: Secondary | ICD-10-CM | POA: Diagnosis not present

## 2022-07-14 DIAGNOSIS — L578 Other skin changes due to chronic exposure to nonionizing radiation: Secondary | ICD-10-CM | POA: Diagnosis not present

## 2022-07-14 NOTE — Patient Instructions (Addendum)
 Cryotherapy Aftercare  Wash gently with soap and water everyday.   Apply Vaseline and Band-Aid daily until healed. Due to recent changes in healthcare laws, you may see results of your pathology and/or laboratory studies on MyChart before the doctors have had a chance to review them. We understand that in some cases there may be results that are confusing or concerning to you. Please understand that not all results are received at the same time and often the doctors may need to interpret multiple results in order to provide you with the best plan of care or course of treatment. Therefore, we ask that you please give us 2 business days to thoroughly review all your results before contacting the office for clarification. Should we see a critical lab result, you will be contacted sooner.   If You Need Anything After Your Visit  If you have any questions or concerns for your doctor, please call our main line at [phone] and press option 4 to reach your doctor's medical assistant. If no one answers, please leave a voicemail as directed and we will return your call as soon as possible. Messages left after 4 pm will be answered the following business day.   You may also send us a message via MyChart. We typically respond to MyChart messages within 1-2 business days.  For prescription refills, please ask your pharmacy to contact our office. Our fax number is [phone].  If you have an urgent issue when the clinic is closed that cannot wait until the next business day, you can page your doctor at the number below.    Please note that while we do our best to be available for urgent issues outside of office hours, we are not available 24/7.   If you have an urgent issue and are unable to reach us, you may choose to seek medical care at your doctor's office, retail clinic, urgent care center, or emergency room.  If you have a medical emergency, please immediately call 911 or go to the emergency  department.  Pager Numbers  - Dr. Kowalski: [phone]  - Dr. Moye: [phone]  - Dr. Stewart: [phone]  In the event of inclement weather, please call our main line at [phone] for an update on the status of any delays or closures.  Dermatology Medication Tips: Please keep the boxes that topical medications come in in order to help keep track of the instructions about where and how to use these. Pharmacies typically print the medication instructions only on the boxes and not directly on the medication tubes.   If your medication is too expensive, please contact our office at [phone] option 4 or send us a message through MyChart.   We are unable to tell what your co-pay for medications will be in advance as this is different depending on your insurance coverage. However, we may be able to find a substitute medication at lower cost or fill out paperwork to get insurance to cover a needed medication.   If a prior authorization is required to get your medication covered by your insurance company, please allow us 1-2 business days to complete this process.  Drug prices often vary depending on where the prescription is filled and some pharmacies may offer cheaper prices.  The website www.goodrx.com contains coupons for medications through different pharmacies. The prices here do not account for what the cost may be with help from insurance (it may be cheaper with your insurance), but the website can give you the   price if you did not use any insurance.  - You can print the associated coupon and take it with your prescription to the pharmacy.  - You may also stop by our office during regular business hours and pick up a GoodRx coupon card.  - If you need your prescription sent electronically to a different pharmacy, notify our office through [facility] MyChart or by phone at [phone] option 4.     Si Usted Necesita Algo Despus de Su Visita  Tambin puede enviarnos un  mensaje a travs de MyChart. Por lo general respondemos a los mensajes de MyChart en el transcurso de 1 a 2 das hbiles.  Para renovar recetas, por favor pida a su farmacia que se ponga en contacto con nuestra oficina. Nuestro nmero de fax es el [phone].  Si tiene un asunto urgente cuando la clnica est cerrada y que no puede esperar hasta el siguiente da hbil, puede llamar/localizar a su doctor(a) al nmero que aparece a continuacin.   Por favor, tenga en cuenta que aunque hacemos todo lo posible para estar disponibles para asuntos urgentes fuera del horario de oficina, no estamos disponibles las 24 horas del da, los 7 das de la semana.   Si tiene un problema urgente y no puede comunicarse con nosotros, puede optar por buscar atencin mdica  en el consultorio de su doctor(a), en una clnica privada, en un centro de atencin urgente o en una sala de emergencias.  Si tiene una emergencia mdica, por favor llame inmediatamente al 911 o vaya a la sala de emergencias.  Nmeros de bper  - Dr. Kowalski: [phone]  - Dra. Moye: [phone]  - Dra. Stewart: [phone]  En caso de inclemencias del tiempo, por favor llame a nuestra lnea principal al [phone] para una actualizacin sobre el estado de cualquier retraso o cierre.  Consejos para la medicacin en dermatologa: Por favor, guarde las cajas en las que vienen los medicamentos de uso tpico para ayudarle a seguir las instrucciones sobre dnde y cmo usarlos. Las farmacias generalmente imprimen las instrucciones del medicamento slo en las cajas y no directamente en los tubos del medicamento.   Si su medicamento es muy caro, por favor, pngase en contacto con nuestra oficina llamando al [phone] y presione la opcin 4 o envenos un mensaje a travs de MyChart.   No podemos decirle cul ser su copago por los medicamentos por adelantado ya que esto es diferente dependiendo de la cobertura de su seguro. Sin embargo,  es posible que podamos encontrar un medicamento sustituto a menor costo o llenar un formulario para que el seguro cubra el medicamento que se considera necesario.   Si se requiere una autorizacin previa para que su compaa de seguros cubra su medicamento, por favor permtanos de 1 a 2 das hbiles para completar este proceso.  Los precios de los medicamentos varan con frecuencia dependiendo del lugar de dnde se surte la receta y alguna farmacias pueden ofrecer precios ms baratos.  El sitio web www.goodrx.com tiene cupones para medicamentos de diferentes farmacias. Los precios aqu no tienen en cuenta lo que podra costar con la ayuda del seguro (puede ser ms barato con su seguro), pero el sitio web puede darle el precio si no utiliz ningn seguro.  - Puede imprimir el cupn correspondiente y llevarlo con su receta a la farmacia.  - Tambin puede pasar por nuestra oficina durante el horario de atencin regular y recoger una tarjeta de cupones de GoodRx.  - Si necesita que   su receta se enve electrnicamente a una farmacia diferente, informe a nuestra oficina a travs de MyChart de [place] o por telfono llamando al [phone] y presione la opcin 4.  

## 2022-07-14 NOTE — Progress Notes (Signed)
Follow-Up Visit   Subjective  Christopher Pruitt is a 77 y.o. male who presents for the following: Hx of BCC and SCC bx proven (L lat elbow, bx and EDC 02/24/22) and check spot (L ear, not sure how long it has been there, no symptoms/R post auricular, comes and goes, pt picks at). The patient has spots, moles and lesions to be evaluated, some may be new or changing and the patient has concerns that these could be cancer.  The following portions of the chart were reviewed this encounter and updated as appropriate:   Tobacco  Allergies  Meds  Problems  Med Hx  Surg Hx  Fam Hx     Review of Systems:  No other skin or systemic complaints except as noted in HPI or Assessment and Plan.  Objective  Well appearing patient in no apparent distress; mood and affect are within normal limits.  A focused examination was performed including face, neck, arms, hands. Relevant physical exam findings are noted in the Assessment and Plan.  Left Ear x 2, R ear x 1, R temple x 1, R cheek x 1, nose x 3, scalp x 1, arms/hands x 10 (19) Pink scaly macules  R neck infra auricular x 1 Stuck on waxy paps with erythema   Assessment & Plan   Actinic Damage - chronic, secondary to cumulative UV radiation exposure/sun exposure over time - diffuse scaly erythematous macules with underlying dyspigmentation - Recommend daily broad spectrum sunscreen SPF 30+ to sun-exposed areas, reapply every 2 hours as needed.  - Recommend staying in the shade or wearing long sleeves, sun glasses (UVA+UVB protection) and wide brim hats (4-inch brim around the entire circumference of the hat). - Call for new or changing lesions.   Purpura - Chronic; persistent and recurrent.  Treatable, but not curable. 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 Observe for recurrence. Call clinic for new or changing lesions.  Recommend regular skin exams, daily broad-spectrum spf 30+ sunscreen use, and photoprotection.    Related Medications mupirocin ointment (BACTROBAN) 2 % Apply 1 Application topically daily. With dressing changes  AK (actinic keratosis) (19) Left Ear x 2, R ear x 1, R temple x 1, R cheek x 1, nose x 3, scalp x 1, arms/hands x 10 Destruction of lesion - Left Ear x 2, R ear x 1, R temple x 1, R cheek x 1, nose x 3, scalp x 1, arms/hands x 10 Complexity: simple   Destruction method: cryotherapy   Informed consent: discussed and consent obtained   Timeout:  patient name, date of birth, surgical site, and procedure verified Lesion destroyed using liquid nitrogen: Yes   Region frozen until ice ball extended beyond lesion: Yes   Outcome: patient tolerated procedure well with no complications   Post-procedure details: wound care instructions given    Inflamed seborrheic keratosis R neck infra auricular x 1 Symptomatic, irritating, patient would like treated. Destruction of lesion - R neck infra auricular x 1 Complexity: simple   Destruction method: cryotherapy   Informed consent: discussed and consent obtained   Timeout:  patient name, date of birth, surgical site, and  procedure verified Lesion destroyed using liquid nitrogen: Yes   Region frozen until ice ball extended beyond lesion: Yes   Outcome: patient tolerated procedure well with no complications   Post-procedure details: wound care instructions given    Return in about 4 months (around 11/13/2022) for AK f/u, Recheck ISK R neck infra auricular.  I, Christopher Pruitt, RMA, am acting as scribe for Christopher Ser, MD . Documentation: I have reviewed the above documentation for accuracy and completeness, and I agree with  the above.  Christopher Ser, MD

## 2022-07-18 ENCOUNTER — Encounter: Payer: Self-pay | Admitting: Dermatology

## 2022-08-05 ENCOUNTER — Other Ambulatory Visit: Payer: Self-pay | Admitting: Urology

## 2022-08-05 ENCOUNTER — Encounter: Payer: Self-pay | Admitting: Urology

## 2022-08-05 ENCOUNTER — Ambulatory Visit (INDEPENDENT_AMBULATORY_CARE_PROVIDER_SITE_OTHER): Payer: Medicare Other | Admitting: Urology

## 2022-08-05 VITALS — BP 156/75 | HR 88 | Ht 65.0 in | Wt 210.0 lb

## 2022-08-05 DIAGNOSIS — C61 Malignant neoplasm of prostate: Secondary | ICD-10-CM | POA: Diagnosis not present

## 2022-08-05 DIAGNOSIS — N3281 Overactive bladder: Secondary | ICD-10-CM | POA: Diagnosis not present

## 2022-08-05 MED ORDER — OXYBUTYNIN CHLORIDE ER 15 MG PO TB24: 15.0000 mg | ORAL_TABLET | Freq: Every day | ORAL | 11 refills | Status: DC

## 2022-08-05 NOTE — Progress Notes (Signed)
08/05/22 2:43 PM   Christopher Pruitt [birth year] [id number]  CC: History of prostate cancer, urinary symptoms  HPI: 77 year old male previously managed at [facility] for prostate cancer.  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  PSA has remained undetectable at that time, most recently<0.04 in February 2024.  He also has had long-term urinary symptoms since radiation primarily overactivity and urge incontinence.  He has been on oxybutynin 5 mg 3 times daily long-term which she does feel improved his symptoms.  He denies any gross hematuria or dysuria.  He does well overnight with 0-1 voids.  He drinks water and some tea during the day.   PMH: Past Medical History:  Diagnosis Date   Anemia    Cancer ([place])    prostate cancer    COPD with emphysema ([place])    Diabetes mellitus, type 2 (HCC)    Elevated lipids    Gout    STABLE   Heart murmur    History of kidney stones    History of prostate cancer    Hypertension    Hypothyroidism    Left hydrocele    Lymph edema    Lt leg   Lymphedema of left leg    OSA on CPAP    Right wrist fracture    INJURY 02-01-2014 PT FELL   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/10/2022   L lat elbow - BCC + SCC, ED&C   Urge urinary incontinence    Wears glasses    Wears hearing aid    BILATERAL    Surgical History: Past Surgical History:  Procedure Laterality Date   CARPAL TUNNEL RELEASE Left 10/25/2018   Procedure: CARPAL TUNNEL RELEASE ENDOSCOPIC LEFT, DIABETIC, SLEEPAPNEA;  Surgeon: Corky Mull, MD;  Location: [facility] ORS;  Service: Orthopedics;  Laterality: Left;   CARPAL TUNNEL RELEASE Right 02/01/2019   Procedure: CARPAL TUNNEL RELEASE ENDOSCOPIC;  Surgeon: Corky Mull, MD;  Location: [facility] ORS;  Service: Orthopedics;  Laterality: Right;   colonoscopy with polypectomy     COLONOSCOPY WITH PROPOFOL N/A 07/27/2018   Procedure: COLONOSCOPY WITH PROPOFOL;   Surgeon: Toledo, Benay Pike, MD;  Location: [facility] ENDOSCOPY;  Service: Gastroenterology;  Laterality: N/A;   HYDROCELE EXCISION Left 02/11/2015   Procedure: LEFT HYDROCELECTOMY ADULT;  Surgeon: Nickie Retort, MD;  Location: [facility];  Service: Urology;  Laterality: Left;   INGUINAL HERNIA REPAIR Bilateral 04/18/2021   Procedure: LAPAROSCOPIC BILATERAL  HERNIA REPAIR WITH MESH, TRANSABDOMINAL PLANE BLOCK, LEFT INCARCARATED SCROTAL HERNIA, RIGHT INGUINAL HERNIA REPAIR;  Surgeon: Michael Boston, MD;  Location: [facility] ORS;  Service: General;  Laterality: Bilateral;   JOINT REPLACEMENT     Rt knee   PARTIAL KNEE ARTHROPLASTY Right 2012   PLACEMENT GOLD STUDS IN PROSTATE  2014   [place]   for [employer] Radiation (prostate cancer)   SHOULDER OPEN ROTATOR CUFF REPAIR Left 2010   TONSILLECTOMY  as child    Family History: No family history on file.  Social History:  reports that he quit smoking about 34 years ago. His smoking use included cigarettes. He has never used smokeless tobacco. He reports that he does not drink alcohol and does not use drugs.  Physical Exam: BP (!) 156/75   Pulse 88   Ht 5\' 5"  (1.651 m)   Wt 210 lb (95.3 kg)   BMI 34.95 kg/m    Constitutional:  Alert  and oriented, No acute distress. Cardiovascular: No clubbing, cyanosis, or edema. Respiratory: Normal respiratory effort, no increased work of breathing. GI: Abdomen is soft, nontender, nondistended, no abdominal masses   Assessment & Plan:   77 year old male with favorable intermediate risk prostate cancer treated at [facility] with external beam radiation in 2014, PSA has been undetectable since that time.  Residual overactive symptoms and urge incontinence that requires multiple pads throughout the day, has been on oxybutynin 5 mg 3 times daily.  Outside records were reviewed.  Reassurance provided regarding undetectable PSA, recommended continuing yearly PSA monitoring  Recommended changing OAB  medication to oxybutynin 15 mg XL daily.  I offered him follow-up in 2 to 3 months to check his symptoms, however he would like to let us know via MyChart or phone call how he is doing, and could consider trial of different anticholinergic like trospium or Myrbetriq/Gemtesa in the future.   Nickolas Madrid, MD 08/05/2022  [facility] [address], [place], [place] [address] [phone]

## 2022-08-05 NOTE — Progress Notes (Unsigned)
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

## 2022-09-18 ENCOUNTER — Other Ambulatory Visit: Payer: Self-pay | Admitting: Specialist

## 2022-09-18 DIAGNOSIS — J849 Interstitial pulmonary disease, unspecified: Secondary | ICD-10-CM

## 2022-09-18 DIAGNOSIS — R911 Solitary pulmonary nodule: Secondary | ICD-10-CM

## 2022-10-28 ENCOUNTER — Ambulatory Visit
Admission: RE | Admit: 2022-10-28 | Discharge: 2022-10-28 | Disposition: A | Discharge status: Home / Self Care | Payer: Medicare Other | Source: Ambulatory Visit | Attending: Specialist | Admitting: Specialist

## 2022-10-28 DIAGNOSIS — R911 Solitary pulmonary nodule: Secondary | ICD-10-CM | POA: Diagnosis present

## 2022-10-28 DIAGNOSIS — J849 Interstitial pulmonary disease, unspecified: Secondary | ICD-10-CM | POA: Insufficient documentation

## 2022-11-04 ENCOUNTER — Ambulatory Visit: Payer: Medicare Other | Admitting: Dermatology

## 2022-11-04 ENCOUNTER — Encounter: Payer: Self-pay | Admitting: Dermatology

## 2022-11-04 VITALS — BP 134/83

## 2022-11-04 DIAGNOSIS — Z79899 Other long term (current) drug therapy: Secondary | ICD-10-CM

## 2022-11-04 DIAGNOSIS — L814 Other melanin hyperpigmentation: Secondary | ICD-10-CM

## 2022-11-04 DIAGNOSIS — Z8589 Personal history of malignant neoplasm of other organs and systems: Secondary | ICD-10-CM

## 2022-11-04 DIAGNOSIS — L820 Inflamed seborrheic keratosis: Secondary | ICD-10-CM | POA: Diagnosis not present

## 2022-11-04 DIAGNOSIS — D1801 Hemangioma of skin and subcutaneous tissue: Secondary | ICD-10-CM

## 2022-11-04 DIAGNOSIS — D229 Melanocytic nevi, unspecified: Secondary | ICD-10-CM

## 2022-11-04 DIAGNOSIS — Z1283 Encounter for screening for malignant neoplasm of skin: Secondary | ICD-10-CM | POA: Diagnosis not present

## 2022-11-04 DIAGNOSIS — W908XXA Exposure to other nonionizing radiation, initial encounter: Secondary | ICD-10-CM | POA: Diagnosis not present

## 2022-11-04 DIAGNOSIS — Z7189 Other specified counseling: Secondary | ICD-10-CM

## 2022-11-04 DIAGNOSIS — L578 Other skin changes due to chronic exposure to nonionizing radiation: Secondary | ICD-10-CM

## 2022-11-04 DIAGNOSIS — L821 Other seborrheic keratosis: Secondary | ICD-10-CM

## 2022-11-04 DIAGNOSIS — L570 Actinic keratosis: Secondary | ICD-10-CM | POA: Diagnosis not present

## 2022-11-04 DIAGNOSIS — S20361A Insect bite (nonvenomous) of right front wall of thorax, initial encounter: Secondary | ICD-10-CM

## 2022-11-04 DIAGNOSIS — Z85828 Personal history of other malignant neoplasm of skin: Secondary | ICD-10-CM

## 2022-11-04 DIAGNOSIS — W57XXXA Bitten or stung by nonvenomous insect and other nonvenomous arthropods, initial encounter: Secondary | ICD-10-CM

## 2022-11-04 MED ORDER — DOXYCYCLINE MONOHYDRATE 100 MG PO CAPS: 100.0000 mg | ORAL_CAPSULE | Freq: Two times a day (BID) | ORAL | 0 refills | Status: AC

## 2022-11-04 NOTE — Patient Instructions (Addendum)
Cryotherapy Aftercare  Wash gently with soap and water everyday.   Apply Vaseline and Band-Aid daily until healed.    Doxycycline should be taken with food to prevent nausea. Do not lay down for 30 minutes after taking. Be cautious with sun exposure and use good sun protection while on this medication. Pregnant women should not take this medication.    Due to recent changes in healthcare laws, you may see results of your pathology and/or laboratory studies on MyChart before the doctors have had a chance to review them. We understand that in some cases there may be results that are confusing or concerning to you. Please understand that not all results are received at the same time and often the doctors may need to interpret multiple results in order to provide you with the best plan of care or course of treatment. Therefore, we ask that you please give us 2 business days to thoroughly review all your results before contacting the office for clarification. Should we see a critical lab result, you will be contacted sooner.   If You Need Anything After Your Visit  If you have any questions or concerns for your doctor, please call our main line at [phone] and press option 4 to reach your doctor's medical assistant. If no one answers, please leave a voicemail as directed and we will return your call as soon as possible. Messages left after 4 pm will be answered the following business day.   You may also send us a message via MyChart. We typically respond to MyChart messages within 1-2 business days.  For prescription refills, please ask your pharmacy to contact our office. Our fax number is [phone].  If you have an urgent issue when the clinic is closed that cannot wait until the next business day, you can page your doctor at the number below.    Please note that while we do our best to be available for urgent issues outside of office hours, we are not available 24/7.   If you have an  urgent issue and are unable to reach us, you may choose to seek medical care at your doctor's office, retail clinic, urgent care center, or emergency room.  If you have a medical emergency, please immediately call 911 or go to the emergency department.  Pager Numbers  - Dr. Kowalski: [phone]  - Dr. Moye: [phone]  - Dr. Stewart: [phone]  In the event of inclement weather, please call our main line at [phone] for an update on the status of any delays or closures.  Dermatology Medication Tips: Please keep the boxes that topical medications come in in order to help keep track of the instructions about where and how to use these. Pharmacies typically print the medication instructions only on the boxes and not directly on the medication tubes.   If your medication is too expensive, please contact our office at [phone] option 4 or send us a message through MyChart.   We are unable to tell what your co-pay for medications will be in advance as this is different depending on your insurance coverage. However, we may be able to find a substitute medication at lower cost or fill out paperwork to get insurance to cover a needed medication.   If a prior authorization is required to get your medication covered by your insurance company, please allow us 1-2 business days to complete this process.  Drug prices often vary depending on where the prescription is filled and some pharmacies may   offer cheaper prices.  The website www.goodrx.com contains coupons for medications through different pharmacies. The prices here do not account for what the cost may be with help from insurance (it may be cheaper with your insurance), but the website can give you the price if you did not use any insurance.  - You can print the associated coupon and take it with your prescription to the pharmacy.  - You may also stop by our office during regular business hours and pick up a GoodRx coupon card.  -  If you need your prescription sent electronically to a different pharmacy, notify our office through [facility] MyChart or by phone at [phone] option 4.     Si Usted Necesita Algo Despus de Su Visita  Tambin puede enviarnos un mensaje a travs de MyChart. Por lo general respondemos a los mensajes de MyChart en el transcurso de 1 a 2 das hbiles.  Para renovar recetas, por favor pida a su farmacia que se ponga en contacto con nuestra oficina. Nuestro nmero de fax es el [phone].  Si tiene un asunto urgente cuando la clnica est cerrada y que no puede esperar hasta el siguiente da hbil, puede llamar/localizar a su doctor(a) al nmero que aparece a continuacin.   Por favor, tenga en cuenta que aunque hacemos todo lo posible para estar disponibles para asuntos urgentes fuera del horario de oficina, no estamos disponibles las 24 horas del da, los 7 das de la semana.   Si tiene un problema urgente y no puede comunicarse con nosotros, puede optar por buscar atencin mdica  en el consultorio de su doctor(a), en una clnica privada, en un centro de atencin urgente o en una sala de emergencias.  Si tiene una emergencia mdica, por favor llame inmediatamente al 911 o vaya a la sala de emergencias.  Nmeros de bper  - Dr. Kowalski: [phone]  - Dra. Moye: [phone]  - Dra. Stewart: [phone]  En caso de inclemencias del tiempo, por favor llame a nuestra lnea principal al [phone] para una actualizacin sobre el estado de cualquier retraso o cierre.  Consejos para la medicacin en dermatologa: Por favor, guarde las cajas en las que vienen los medicamentos de uso tpico para ayudarle a seguir las instrucciones sobre dnde y cmo usarlos. Las farmacias generalmente imprimen las instrucciones del medicamento slo en las cajas y no directamente en los tubos del medicamento.   Si su medicamento es muy caro, por favor, pngase en contacto con nuestra oficina  llamando al [phone] y presione la opcin 4 o envenos un mensaje a travs de MyChart.   No podemos decirle cul ser su copago por los medicamentos por adelantado ya que esto es diferente dependiendo de la cobertura de su seguro. Sin embargo, es posible que podamos encontrar un medicamento sustituto a menor costo o llenar un formulario para que el seguro cubra el medicamento que se considera necesario.   Si se requiere una autorizacin previa para que su compaa de seguros cubra su medicamento, por favor permtanos de 1 a 2 das hbiles para completar este proceso.  Los precios de los medicamentos varan con frecuencia dependiendo del lugar de dnde se surte la receta y alguna farmacias pueden ofrecer precios ms baratos.  El sitio web www.goodrx.com tiene cupones para medicamentos de diferentes farmacias. Los precios aqu no tienen en cuenta lo que podra costar con la ayuda del seguro (puede ser ms barato con su seguro), pero el sitio web puede darle el precio si no utiliz   ningn seguro.  - Puede imprimir el cupn correspondiente y llevarlo con su receta a la farmacia.  - Tambin puede pasar por nuestra oficina durante el horario de atencin regular y recoger una tarjeta de cupones de GoodRx.  - Si necesita que su receta se enve electrnicamente a una farmacia diferente, informe a nuestra oficina a travs de MyChart de [facility] o por telfono llamando al [phone] y presione la opcin 4.  

## 2022-11-04 NOTE — Progress Notes (Signed)
Follow-Up Visit   Subjective  Christopher Pruitt is a 77 y.o. male who presents for the following: Skin Cancer Screening and Upper Body Skin Exam, AK 53m f/u, face, scalp, ears, recheck ISK R neck inra auricular, check spots L arm 26m, hx of tick bite R chest ~4days, tender The patient presents for Upper Body Skin Exam (UBSE) for skin cancer screening and mole check. The patient has spots, moles and lesions to be evaluated, some may be new or changing and the patient has concerns that these could be cancer.  The following portions of the chart were reviewed this encounter and updated as appropriate: medications, allergies, medical history  Review of Systems:  No other skin or systemic complaints except as noted in HPI or Assessment and Plan.  Objective  Well appearing patient in no apparent distress; mood and affect are within normal limits.  All skin waist up examined. Relevant physical exam findings are noted in the Assessment and Plan.  forehead x 1, scalp x 1 (2) Stuck on waxy paps with erythema  arms/hands x 18 (18) Hyperkeratotic macules    Assessment & Plan   Inflamed seborrheic keratosis (2) forehead x 1, scalp x 1  Symptomatic, irritating, patient would like treated.   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 18  Destruction of lesion - arms/hands x 18 Complexity: simple   Destruction method: cryotherapy   Informed consent: discussed and consent obtained   Timeout:  patient name, date of birth, surgical  site, and procedure verified Lesion destroyed using liquid nitrogen: Yes   Region frozen until ice ball extended beyond lesion: Yes   Outcome: patient tolerated procedure well with no complications   Post-procedure details: wound care instructions given     Lentigines, Seborrheic Keratoses, Hemangiomas - Benign normal skin lesions - Benign-appearing - Call for any changes  Melanocytic Nevi - Tan-brown and/or pink-flesh-colored symmetric macules and papules - Benign appearing on exam today - Observation - Call clinic for new or changing moles - Recommend daily use of broad spectrum spf 30+ sunscreen to sun-exposed areas.   Actinic Damage - Chronic condition, secondary to cumulative UV/sun exposure - diffuse scaly erythematous macules with underlying dyspigmentation - Recommend daily broad spectrum sunscreen SPF 30+ to sun-exposed areas, reapply every 2 hours as needed.  - Staying in the shade or wearing long sleeves, sun glasses (UVA+UVB protection) and wide brim hats (4-inch brim around the entire circumference of the hat) are also recommended for sun protection.  - Call for new or changing lesions.  Skin cancer screening performed today.  HISTORY OF BASAL CELL CARCINOMA OF THE SKIN/SQUAMOUS CELL CARCINOMA - No evidence of recurrence today - No lymphadenopathy - Recommend regular full body skin exams - Recommend daily broad spectrum sunscreen SPF 30+ to sun-exposed areas, reapply every 2 hours as needed.  - Call if any new or changing lesions are noted between office visits  - L lat elbow  TICK BITE Hx of probable deer tick bite attached for 48 hours or less no symptoms of Lyme Disease  R chest, ~4 days ago Exam: pink macule R chest  Treatment Plan: Start Doxycycline 100mg  1 po bid x 10 days with food and drink  Doxycycline should be taken with food to prevent nausea. Do not lay down for 30 minutes after taking. Be cautious with sun exposure and use good sun protection while  on this medication. Pregnant women should not take this medication.    Return in about 6 months (around 05/06/2023) for AK f/u.  I, Ardis Rowan, RMA, am acting as scribe for Armida Sans, MD .  Documentation: I have reviewed the above documentation for accuracy and completeness, and I agree with the above.  Armida Sans, MD

## 2022-11-06 ENCOUNTER — Encounter: Payer: Self-pay | Admitting: Dermatology

## 2023-03-04 ENCOUNTER — Other Ambulatory Visit: Payer: Self-pay | Admitting: Specialist

## 2023-03-04 DIAGNOSIS — R918 Other nonspecific abnormal finding of lung field: Secondary | ICD-10-CM

## 2023-03-10 ENCOUNTER — Ambulatory Visit
Admission: RE | Admit: 2023-03-10 | Discharge: 2023-03-10 | Disposition: A | Discharge status: Home / Self Care | Payer: Medicare Other | Source: Ambulatory Visit | Attending: Specialist | Admitting: Specialist

## 2023-03-10 DIAGNOSIS — R918 Other nonspecific abnormal finding of lung field: Secondary | ICD-10-CM | POA: Diagnosis present

## 2023-03-15 ENCOUNTER — Other Ambulatory Visit: Payer: Self-pay | Admitting: *Deleted

## 2023-03-15 DIAGNOSIS — N3281 Overactive bladder: Secondary | ICD-10-CM

## 2023-03-16 ENCOUNTER — Encounter: Payer: Self-pay | Admitting: Urology

## 2023-03-16 ENCOUNTER — Other Ambulatory Visit
Admission: RE | Admit: 2023-03-16 | Discharge: 2023-03-16 | Disposition: A | Discharge status: Home / Self Care | Payer: Medicare Other | Attending: Urology | Admitting: Urology

## 2023-03-16 ENCOUNTER — Ambulatory Visit: Payer: Medicare Other | Admitting: Urology

## 2023-03-16 VITALS — BP 147/33 | HR 89 | Ht 67.0 in | Wt 205.0 lb

## 2023-03-16 DIAGNOSIS — N3941 Urge incontinence: Secondary | ICD-10-CM

## 2023-03-16 DIAGNOSIS — C61 Malignant neoplasm of prostate: Secondary | ICD-10-CM

## 2023-03-16 DIAGNOSIS — N3281 Overactive bladder: Secondary | ICD-10-CM | POA: Diagnosis not present

## 2023-03-16 DIAGNOSIS — R350 Frequency of micturition: Secondary | ICD-10-CM | POA: Diagnosis not present

## 2023-03-16 DIAGNOSIS — Z8546 Personal history of malignant neoplasm of prostate: Secondary | ICD-10-CM

## 2023-03-16 LAB — URINALYSIS, COMPLETE (UACMP) WITH MICROSCOPIC
Glucose, UA: 250 mg/dL — AB
Leukocytes,Ua: NEGATIVE
Nitrite: NEGATIVE
Protein, ur: 100 mg/dL — AB
Specific Gravity, Urine: >1.03 — ABNORMAL HIGH (ref 1.005–1.030)
WBC, UA: NONE SEEN WBC/hpf (ref 0–5)
pH: 5 (ref 5.0–8.0)

## 2023-03-16 LAB — BLADDER SCAN AMB NON-IMAGING

## 2023-03-16 MED ORDER — MIRABEGRON ER 50 MG PO TB24: 50.0000 mg | ORAL_TABLET | Freq: Every day | ORAL | Status: DC

## 2023-03-16 NOTE — Progress Notes (Signed)
   03/16/2023 3:48 PM   Christopher Pruitt 18-Mar-1946 [id number]  Reason for visit: Follow up history of prostate cancer, overactive bladder/urinary symptoms/LUTS  HPI: 77 year old male who I originally met in March 2024 when he transferred his care from [facility].  He was diagnosed with intermediate risk prostate cancer and treated with CyberKnife radiation in 2014, PSA has remained very low since that time, most recently 0.01 in March 2024.  He has had long-term urinary symptoms since radiation of primarily overactive bladder and urge incontinence.  He was previously on oxybutynin 5 mg 3 times daily, and we change that to the 15 mg XL dose.  He feels his urinary symptoms have worsened over the last 6 months or so with urgency, frequency, and urge incontinence.  This requires a depends.  Urinalysis today is benign, PVR is normal at 0ml.  We discussed that overactive bladder (OAB) is not a disease, but is a symptom complex that is generally not life-threatening.  Symptoms typically include urinary urgency, frequency, and urge incontinence.  There are numerous treatment options, however there are risks and benefits with both medical and surgical management.  First-line treatment is behavioral therapies including bladder training, pelvic floor muscle training, and fluid management.  Second line treatments include oral antimuscarinics(Ditropan er, Trospium) and beta-3 agonist (Mybetriq). There is typically a period of medication trial (4-8 weeks) to find the optimal therapy and dosing. If symptoms are bothersome despite the above management, third line options include intra-detrusor botox, peripheral tibial nerve stimulation (PTNS), and interstim (SNS). These are more invasive treatments with higher side effect profile, but may improve quality of life for patients with severe OAB symptoms.   -Trial of beta 3 agonist, samples given -Reassurance provided regarding normal PSA value -Consider cystoscopy if no  improvement on beta 3 agonist -RTC 4 weeks symptom check  Sondra Come, MD  [facility] [address] [place], [place] [address] [phone]

## 2023-04-13 ENCOUNTER — Encounter: Payer: Self-pay | Admitting: Urology

## 2023-04-13 ENCOUNTER — Ambulatory Visit: Payer: Medicare Other | Admitting: Urology

## 2023-04-13 VITALS — BP 165/84 | HR 102 | Ht 68.0 in | Wt 203.0 lb

## 2023-04-13 DIAGNOSIS — R399 Unspecified symptoms and signs involving the genitourinary system: Secondary | ICD-10-CM | POA: Diagnosis not present

## 2023-04-13 DIAGNOSIS — R350 Frequency of micturition: Secondary | ICD-10-CM | POA: Diagnosis not present

## 2023-04-13 DIAGNOSIS — Z8546 Personal history of malignant neoplasm of prostate: Secondary | ICD-10-CM

## 2023-04-13 DIAGNOSIS — C61 Malignant neoplasm of prostate: Secondary | ICD-10-CM

## 2023-04-13 DIAGNOSIS — N3281 Overactive bladder: Secondary | ICD-10-CM

## 2023-04-13 MED ORDER — GEMTESA 75 MG PO TABS: 75.0000 mg | ORAL_TABLET | Freq: Every day | ORAL | Status: DC

## 2023-04-13 NOTE — Progress Notes (Signed)
   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  He has had long-term overactive symptoms since radiation with urgency, frequency, and urge incontinence.  He was previously on oxybutynin and was most recently changed to Myrbetriq 50 mg daily in November 2024.  He is unsure if this has made any significant change or improvement.  He continues to be bothered by urgency and urge incontinence.  He has changed to mostly water.  Urinalysis and PVR have always been normal.  He is a very challenging historian with some tangential thought process.  I think we need to have some reasonable expectations moving forward, his urinary symptoms have been present for over 10 years since his treatment with radiation for prostate cancer.  I recommended a trial of Gemtesa to replace the Myrbetriq to see if he has any improvement in his symptoms, with follow-up for cystoscopy in 1 month if no improvement to rule out urethral stricture disease.  He also had a number of questions today about scrotal swelling.  He has a history of a left-sided hydrocele repair by Dr. Sherryl Barters in 2016, he has on exam some moderate right scrotal swelling likely representing a hydrocele.  We discussed this would not have any impact on his urinary symptoms, and he is not having any scrotal pain or bother that would warrant repair drainage at this time.  Trial of Gemtesa for overactive bladder Follow-up for cystoscopy in 1 month, if symptoms resolve on Gemtesa can cancel cystoscopy Recommend surveillance alone for suspected right-sided hydrocele   Sondra Come, MD  [facility] [address] [place], [place] [address] [phone]

## 2023-04-27 ENCOUNTER — Encounter: Payer: Self-pay | Admitting: Urology

## 2023-05-06 ENCOUNTER — Emergency Department: Payer: Medicare Other

## 2023-05-06 ENCOUNTER — Encounter: Payer: Self-pay | Admitting: Emergency Medicine

## 2023-05-06 ENCOUNTER — Other Ambulatory Visit: Payer: Self-pay

## 2023-05-06 ENCOUNTER — Inpatient Hospital Stay
Admission: EM | Admit: 2023-05-06 | Discharge: 2023-05-10 | DRG: 280 | Disposition: A | Discharge status: Home / Self Care | Payer: Medicare Other | Attending: Internal Medicine | Admitting: Internal Medicine

## 2023-05-06 DIAGNOSIS — I10 Essential (primary) hypertension: Secondary | ICD-10-CM | POA: Diagnosis not present

## 2023-05-06 DIAGNOSIS — Z7984 Long term (current) use of oral hypoglycemic drugs: Secondary | ICD-10-CM

## 2023-05-06 DIAGNOSIS — Z85828 Personal history of other malignant neoplasm of skin: Secondary | ICD-10-CM

## 2023-05-06 DIAGNOSIS — Z87891 Personal history of nicotine dependence: Secondary | ICD-10-CM | POA: Diagnosis not present

## 2023-05-06 DIAGNOSIS — Z888 Allergy status to other drugs, medicaments and biological substances status: Secondary | ICD-10-CM

## 2023-05-06 DIAGNOSIS — Z885 Allergy status to narcotic agent status: Secondary | ICD-10-CM

## 2023-05-06 DIAGNOSIS — Z7982 Long term (current) use of aspirin: Secondary | ICD-10-CM | POA: Diagnosis not present

## 2023-05-06 DIAGNOSIS — I2510 Atherosclerotic heart disease of native coronary artery without angina pectoris: Secondary | ICD-10-CM | POA: Diagnosis present

## 2023-05-06 DIAGNOSIS — E782 Mixed hyperlipidemia: Secondary | ICD-10-CM | POA: Diagnosis present

## 2023-05-06 DIAGNOSIS — Z96651 Presence of right artificial knee joint: Secondary | ICD-10-CM | POA: Diagnosis present

## 2023-05-06 DIAGNOSIS — Z1152 Encounter for screening for COVID-19: Secondary | ICD-10-CM

## 2023-05-06 DIAGNOSIS — I420 Dilated cardiomyopathy: Secondary | ICD-10-CM | POA: Diagnosis present

## 2023-05-06 DIAGNOSIS — R7989 Other specified abnormal findings of blood chemistry: Secondary | ICD-10-CM

## 2023-05-06 DIAGNOSIS — E039 Hypothyroidism, unspecified: Secondary | ICD-10-CM | POA: Diagnosis present

## 2023-05-06 DIAGNOSIS — Z974 Presence of external hearing-aid: Secondary | ICD-10-CM

## 2023-05-06 DIAGNOSIS — I214 Non-ST elevation (NSTEMI) myocardial infarction: Principal | ICD-10-CM

## 2023-05-06 DIAGNOSIS — N183 Chronic kidney disease, stage 3 unspecified: Secondary | ICD-10-CM | POA: Diagnosis present

## 2023-05-06 DIAGNOSIS — N1831 Chronic kidney disease, stage 3a: Secondary | ICD-10-CM | POA: Diagnosis present

## 2023-05-06 DIAGNOSIS — Z8546 Personal history of malignant neoplasm of prostate: Secondary | ICD-10-CM | POA: Diagnosis not present

## 2023-05-06 DIAGNOSIS — J439 Emphysema, unspecified: Secondary | ICD-10-CM | POA: Diagnosis present

## 2023-05-06 DIAGNOSIS — E1169 Type 2 diabetes mellitus with other specified complication: Secondary | ICD-10-CM | POA: Diagnosis present

## 2023-05-06 DIAGNOSIS — I130 Hypertensive heart and chronic kidney disease with heart failure and stage 1 through stage 4 chronic kidney disease, or unspecified chronic kidney disease: Secondary | ICD-10-CM | POA: Diagnosis present

## 2023-05-06 DIAGNOSIS — G4733 Obstructive sleep apnea (adult) (pediatric): Secondary | ICD-10-CM | POA: Diagnosis present

## 2023-05-06 DIAGNOSIS — I5031 Acute diastolic (congestive) heart failure: Secondary | ICD-10-CM | POA: Diagnosis present

## 2023-05-06 DIAGNOSIS — J189 Pneumonia, unspecified organism: Secondary | ICD-10-CM

## 2023-05-06 DIAGNOSIS — Z881 Allergy status to other antibiotic agents status: Secondary | ICD-10-CM

## 2023-05-06 DIAGNOSIS — Z79890 Hormone replacement therapy (postmenopausal): Secondary | ICD-10-CM

## 2023-05-06 DIAGNOSIS — J849 Interstitial pulmonary disease, unspecified: Secondary | ICD-10-CM | POA: Diagnosis present

## 2023-05-06 DIAGNOSIS — J449 Chronic obstructive pulmonary disease, unspecified: Secondary | ICD-10-CM | POA: Diagnosis not present

## 2023-05-06 DIAGNOSIS — J441 Chronic obstructive pulmonary disease with (acute) exacerbation: Secondary | ICD-10-CM | POA: Diagnosis present

## 2023-05-06 DIAGNOSIS — E1122 Type 2 diabetes mellitus with diabetic chronic kidney disease: Secondary | ICD-10-CM | POA: Diagnosis present

## 2023-05-06 DIAGNOSIS — Z87442 Personal history of urinary calculi: Secondary | ICD-10-CM

## 2023-05-06 DIAGNOSIS — J9601 Acute respiratory failure with hypoxia: Secondary | ICD-10-CM | POA: Diagnosis present

## 2023-05-06 DIAGNOSIS — Z79899 Other long term (current) drug therapy: Secondary | ICD-10-CM

## 2023-05-06 DIAGNOSIS — Z880 Allergy status to penicillin: Secondary | ICD-10-CM

## 2023-05-06 LAB — RESP PANEL BY RT-PCR (RSV, FLU A&B, COVID)  RVPGX2
Influenza A by PCR: NEGATIVE
Influenza B by PCR: NEGATIVE
Resp Syncytial Virus by PCR: NEGATIVE
SARS Coronavirus 2 by RT PCR: NEGATIVE

## 2023-05-06 LAB — COMPREHENSIVE METABOLIC PANEL
ALT: 12 U/L (ref 0–44)
AST: 17 U/L (ref 15–41)
Albumin: 4.1 g/dL (ref 3.5–5.0)
Alkaline Phosphatase: 73 U/L (ref 38–126)
Anion gap: 10 (ref 5–15)
BUN: 28 mg/dL — ABNORMAL HIGH (ref 8–23)
CO2: 23 mmol/L (ref 22–32)
Calcium: 8.8 mg/dL — ABNORMAL LOW (ref 8.9–10.3)
Chloride: 103 mmol/L (ref 98–111)
Creatinine, Ser: 1.45 mg/dL — ABNORMAL HIGH (ref 0.61–1.24)
GFR, Estimated: 49 mL/min — ABNORMAL LOW (ref >60)
Glucose, Bld: 158 mg/dL — ABNORMAL HIGH (ref 70–99)
Potassium: 3.6 mmol/L (ref 3.5–5.1)
Sodium: 136 mmol/L (ref 135–145)
Total Bilirubin: 0.9 mg/dL (ref <1.2)
Total Protein: 7.3 g/dL (ref 6.5–8.1)

## 2023-05-06 LAB — CBC WITH DIFFERENTIAL/PLATELET
Abs Immature Granulocytes: 0.02 10*3/uL (ref 0.00–0.07)
Basophils Absolute: 0 10*3/uL (ref 0.0–0.1)
Basophils Relative: 1 %
Eosinophils Absolute: 0.1 10*3/uL (ref 0.0–0.5)
Eosinophils Relative: 1 %
HCT: 33.6 % — ABNORMAL LOW (ref 39.0–52.0)
Hemoglobin: 11.4 g/dL — ABNORMAL LOW (ref 13.0–17.0)
Immature Granulocytes: 1 %
Lymphocytes Relative: 31 %
Lymphs Abs: 1.3 10*3/uL (ref 0.7–4.0)
MCH: 29.9 pg (ref 26.0–34.0)
MCHC: 33.9 g/dL (ref 30.0–36.0)
MCV: 88.2 fL (ref 80.0–100.0)
Monocytes Absolute: 0.2 10*3/uL (ref 0.1–1.0)
Monocytes Relative: 6 %
Neutro Abs: 2.5 10*3/uL (ref 1.7–7.7)
Neutrophils Relative %: 60 %
Platelets: 114 10*3/uL — ABNORMAL LOW (ref 150–400)
RBC: 3.81 MIL/uL — ABNORMAL LOW (ref 4.22–5.81)
RDW: 15.8 % — ABNORMAL HIGH (ref 11.5–15.5)
WBC: 4.1 10*3/uL (ref 4.0–10.5)
nRBC: 0 % (ref 0.0–0.2)

## 2023-05-06 LAB — LACTIC ACID, PLASMA
Lactic Acid, Venous: 1.4 mmol/L (ref 0.5–1.9)
Lactic Acid, Venous: 0.9 mmol/L (ref 0.5–1.9)

## 2023-05-06 LAB — TROPONIN I (HIGH SENSITIVITY)
Troponin I (High Sensitivity): 44 ng/L — ABNORMAL HIGH (ref <18)
Troponin I (High Sensitivity): 2272 ng/L (ref <18)
Troponin I (High Sensitivity): 7934 ng/L (ref <18)
Troponin I (High Sensitivity): 7467 ng/L (ref <18)
Troponin I (High Sensitivity): 6946 ng/L (ref <18)

## 2023-05-06 LAB — EXPECTORATED SPUTUM ASSESSMENT W GRAM STAIN, RFLX TO RESP C

## 2023-05-06 LAB — HEPARIN LEVEL (UNFRACTIONATED): Heparin Unfractionated: 0.12 [IU]/mL — ABNORMAL LOW (ref 0.30–0.70)

## 2023-05-06 LAB — MAGNESIUM: Magnesium: 1.6 mg/dL — ABNORMAL LOW (ref 1.7–2.4)

## 2023-05-06 LAB — STREP PNEUMONIAE URINARY ANTIGEN: Strep Pneumo Urinary Antigen: NEGATIVE

## 2023-05-06 LAB — PROTIME-INR
INR: 1.1 (ref 0.8–1.2)
Prothrombin Time: 14.7 s (ref 11.4–15.2)

## 2023-05-06 LAB — CBG MONITORING, ED
Glucose-Capillary: 192 mg/dL — ABNORMAL HIGH (ref 70–99)
Glucose-Capillary: 225 mg/dL — ABNORMAL HIGH (ref 70–99)

## 2023-05-06 LAB — APTT: aPTT: 31 s (ref 24–36)

## 2023-05-06 LAB — HEMOGLOBIN A1C
Hgb A1c MFr Bld: 5.9 % — ABNORMAL HIGH (ref 4.8–5.6)
Mean Plasma Glucose: 123 mg/dL

## 2023-05-06 LAB — BRAIN NATRIURETIC PEPTIDE: B Natriuretic Peptide: 424.9 pg/mL — ABNORMAL HIGH (ref 0.0–100.0)

## 2023-05-06 MED ORDER — IPRATROPIUM-ALBUTEROL 0.5-2.5 (3) MG/3ML IN SOLN
3.0000 mL | Freq: Once | RESPIRATORY_TRACT | Status: AC
  Administered 2023-05-06: 3 mL via RESPIRATORY_TRACT
  Filled 2023-05-06: qty 3

## 2023-05-06 MED ORDER — SODIUM CHLORIDE 0.9% FLUSH
3.0000 mL | Freq: Two times a day (BID) | INTRAVENOUS | Status: DC
  Administered 2023-05-06 – 2023-05-10 (×9): 3 mL via INTRAVENOUS

## 2023-05-06 MED ORDER — FUROSEMIDE 40 MG PO TABS
20.0000 mg | ORAL_TABLET | Freq: Every day | ORAL | Status: DC
  Administered 2023-05-06: 20 mg via ORAL
  Filled 2023-05-06: qty 1

## 2023-05-06 MED ORDER — IPRATROPIUM-ALBUTEROL 0.5-2.5 (3) MG/3ML IN SOLN
3.0000 mL | Freq: Four times a day (QID) | RESPIRATORY_TRACT | Status: DC
  Administered 2023-05-06 (×2): 3 mL via RESPIRATORY_TRACT
  Filled 2023-05-06 (×2): qty 3

## 2023-05-06 MED ORDER — FUROSEMIDE 10 MG/ML IJ SOLN
60.0000 mg | Freq: Once | INTRAMUSCULAR | Status: AC
  Administered 2023-05-06: 60 mg via INTRAVENOUS
  Filled 2023-05-06: qty 8

## 2023-05-06 MED ORDER — HEPARIN BOLUS VIA INFUSION
4000.0000 [IU] | Freq: Once | INTRAVENOUS | Status: AC
  Administered 2023-05-06: 4000 [IU] via INTRAVENOUS
  Filled 2023-05-06: qty 4000

## 2023-05-06 MED ORDER — PREDNISONE 20 MG PO TABS: 40.0000 mg | ORAL_TABLET | Freq: Every day | ORAL | Status: DC

## 2023-05-06 MED ORDER — IOHEXOL 350 MG/ML SOLN
75.0000 mL | Freq: Once | INTRAVENOUS | Status: AC | PRN
  Administered 2023-05-06: 75 mL via INTRAVENOUS

## 2023-05-06 MED ORDER — ONDANSETRON HCL 4 MG/2ML IJ SOLN: 4.0000 mg | Freq: Four times a day (QID) | INTRAMUSCULAR | Status: DC | PRN

## 2023-05-06 MED ORDER — SODIUM CHLORIDE 0.9 % IV SOLN
500.0000 mg | INTRAVENOUS | Status: AC
  Administered 2023-05-06 – 2023-05-10 (×5): 500 mg via INTRAVENOUS
  Filled 2023-05-06 (×5): qty 5

## 2023-05-06 MED ORDER — SODIUM CHLORIDE 0.9 % IV SOLN
2.0000 g | INTRAVENOUS | Status: DC
  Administered 2023-05-06 – 2023-05-07 (×2): 2 g via INTRAVENOUS
  Filled 2023-05-06 (×2): qty 20

## 2023-05-06 MED ORDER — FUROSEMIDE 10 MG/ML IJ SOLN
40.0000 mg | Freq: Once | INTRAMUSCULAR | Status: AC
  Administered 2023-05-07: 40 mg via INTRAVENOUS
  Filled 2023-05-06: qty 4

## 2023-05-06 MED ORDER — ENOXAPARIN SODIUM 60 MG/0.6ML IJ SOSY: 45.0000 mg | PREFILLED_SYRINGE | INTRAMUSCULAR | Status: DC

## 2023-05-06 MED ORDER — SODIUM CHLORIDE 0.9 % IV SOLN: 250.0000 mL | INTRAVENOUS | Status: AC | PRN

## 2023-05-06 MED ORDER — SODIUM CHLORIDE 0.9% FLUSH: 3.0000 mL | INTRAVENOUS | Status: DC | PRN

## 2023-05-06 MED ORDER — INSULIN ASPART 100 UNIT/ML IJ SOLN
0.0000 [IU] | Freq: Three times a day (TID) | INTRAMUSCULAR | Status: DC
  Administered 2023-05-06: 3 [IU] via SUBCUTANEOUS
  Administered 2023-05-06: 5 [IU] via SUBCUTANEOUS
  Administered 2023-05-07 (×3): 3 [IU] via SUBCUTANEOUS
  Administered 2023-05-08: 2 [IU] via SUBCUTANEOUS
  Filled 2023-05-06 (×6): qty 1

## 2023-05-06 MED ORDER — ALBUTEROL SULFATE (2.5 MG/3ML) 0.083% IN NEBU: 2.5000 mg | INHALATION_SOLUTION | RESPIRATORY_TRACT | Status: DC | PRN

## 2023-05-06 MED ORDER — ONDANSETRON HCL 4 MG PO TABS
4.0000 mg | ORAL_TABLET | Freq: Four times a day (QID) | ORAL | Status: DC | PRN
Start: 2023-05-06 — End: 2023-05-10

## 2023-05-06 MED ORDER — HEPARIN (PORCINE) 25000 UT/250ML-% IV SOLN
1750.0000 [IU]/h | INTRAVENOUS | Status: DC
Start: 2023-05-06 — End: 2023-05-10
  Administered 2023-05-06: 1100 [IU]/h via INTRAVENOUS
  Administered 2023-05-07 – 2023-05-10 (×4): 1600 [IU]/h via INTRAVENOUS
  Filled 2023-05-06 (×6): qty 250

## 2023-05-06 MED ORDER — METHYLPREDNISOLONE SODIUM SUCC 40 MG IJ SOLR
40.0000 mg | Freq: Two times a day (BID) | INTRAMUSCULAR | Status: AC
Start: 2023-05-06 — End: 2023-05-07
  Administered 2023-05-06 – 2023-05-07 (×2): 40 mg via INTRAVENOUS
  Filled 2023-05-06 (×2): qty 1

## 2023-05-06 MED ORDER — MAGNESIUM SULFATE 2 GM/50ML IV SOLN
2.0000 g | Freq: Once | INTRAVENOUS | Status: AC
  Administered 2023-05-06: 2 g via INTRAVENOUS
  Filled 2023-05-06: qty 50

## 2023-05-06 MED ORDER — IPRATROPIUM-ALBUTEROL 0.5-2.5 (3) MG/3ML IN SOLN
3.0000 mL | Freq: Two times a day (BID) | RESPIRATORY_TRACT | Status: DC
  Administered 2023-05-07 – 2023-05-09 (×6): 3 mL via RESPIRATORY_TRACT
  Filled 2023-05-06 (×7): qty 3

## 2023-05-06 MED ORDER — METHYLPREDNISOLONE SODIUM SUCC 125 MG IJ SOLR
125.0000 mg | Freq: Once | INTRAMUSCULAR | Status: AC
  Administered 2023-05-06: 125 mg via INTRAVENOUS
  Filled 2023-05-06: qty 2

## 2023-05-06 MED ORDER — HEPARIN BOLUS VIA INFUSION
2600.0000 [IU] | Freq: Once | INTRAVENOUS | Status: AC
  Administered 2023-05-06: 2600 [IU] via INTRAVENOUS
  Filled 2023-05-06: qty 2600

## 2023-05-06 NOTE — Assessment & Plan Note (Addendum)
CT chest with concern of bilateral infiltrate with differential of pulmonary edema versus atypical pneumonia.  Procalcitonin at 0.11.  Patient was started on Rocephin and Zithromax. Doubt this is pneumonia as there is not much upper respiratory symptoms, afebrile and no leukocytosis. -Stopping antibiotics -Will recheck procalcitonin tomorrow

## 2023-05-06 NOTE — H&P (Addendum)
History and Physical    Patient: Christopher Pruitt DOB: 1946/03/11 DOA: 05/06/2023 DOS: the patient was seen and examined on 05/06/2023 PCP: Danella Penton, MD  Patient coming from: Home  Chief Complaint:  Chief Complaint  Patient presents with   Respiratory Distress   HPI: Christopher Pruitt is a 77 y.o. male with medical history significant of COPD/interstitial lung disease, type 2 diabetes, stage III CKD, hypertension, hyperlipidemia presenting with acute respiratory failure with hypoxia, elevated BNP, pneumonia, elevated troponin.  Patient reports sudden onset of cough and shortness of breath earlier this morning.  No chest pain.  Has some?  Trace hemoptysis with coughing.  Mild wheezing.  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  Followed by Dr. Meredeth Ide outpatient.  Patient reports not being on any inhalers at present.  No abdominal pain.  No nausea or vomiting.  Patient denies any episodes like this in the past.  Denies any orthopnea or PND.  No reported lower extremity swelling. Presented to the ER afebrile, heart rate 100s, respirations 20s, BP stable.  Initially required BiPAP, transition to 4 L nasal cannula.  White count 4.1, hemoglobin 11.4, platelets 114, troponin 44.  Creatinine 1.45.  Glucose 158.  BNP 425.  Chest x-ray with interstitial lung disease pattern.  CTA of the chest negative for PE, showing bilateral pleural effusions with atelectasis and interstitial septal thickening concerning for opacities versus edema versus multifocal pneumonia.  Positive coronary artery calcifications. Review of Systems: As mentioned in the history of present illness. All other systems reviewed and are negative. 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, ED&C   Urge urinary incontinence    Wears glasses    Wears hearing aid    BILATERAL   Past Surgical History:  Procedure Laterality Date   CARPAL TUNNEL RELEASE Left 10/25/2018   Procedure: CARPAL TUNNEL RELEASE ENDOSCOPIC LEFT, DIABETIC, SLEEPAPNEA;  Surgeon: Christena Flake, MD;  Location: [facility];  Service: Orthopedics;  Laterality: Left;   CARPAL TUNNEL RELEASE Right 02/01/2019   Procedure: CARPAL TUNNEL RELEASE ENDOSCOPIC;  Surgeon: Christena Flake, MD;  Location: [facility];  Service: Orthopedics;  Laterality: Right;   colonoscopy with polypectomy     COLONOSCOPY WITH PROPOFOL N/A 07/27/2018   Procedure: COLONOSCOPY WITH PROPOFOL;  Surgeon: Toledo, Boykin Nearing, MD;  Location: [facility];  Service: Gastroenterology;  Laterality: N/A;   HYDROCELE EXCISION Left 02/11/2015   Procedure: LEFT HYDROCELECTOMY ADULT;  Surgeon: Hildred Laser, MD;  [facility];  Service: Urology;  Laterality: Left;   INGUINAL HERNIA REPAIR Bilateral 04/18/2021   Procedure: LAPAROSCOPIC BILATERAL  HERNIA REPAIR WITH MESH, TRANSABDOMINAL PLANE BLOCK, LEFT INCARCARATED SCROTAL  HERNIA, RIGHT INGUINAL HERNIA REPAIR;  Surgeon: Karie Soda, MD;  Location: [facility];  Service: General;  Laterality: Bilateral;   JOINT REPLACEMENT     Rt knee   PARTIAL KNEE ARTHROPLASTY Right 2012   PLACEMENT GOLD STUDS IN PROSTATE  2014   [place]   for [facility] Radiation (prostate cancer)   SHOULDER OPEN ROTATOR CUFF REPAIR Left 2010   TONSILLECTOMY  as child   Social History:  reports that he quit smoking about 35 years ago. His smoking use  included cigarettes. He started smoking about 47 years ago. He has been exposed to tobacco smoke. He has never used smokeless tobacco. He reports that he does not drink alcohol and does not use drugs.  Allergies  Allergen Reactions   Amoxicillin-Pot Clavulanate Diarrhea and Nausea And Vomiting    Did it involve swelling of the face/tongue/throat, SOB, or low BP? No  Did it involve sudden or severe rash/hives, skin peeling, or any reaction on the inside of your mouth or nose? No  Did you need to seek medical attention at a hospital or doctor's office? No  When did it last happen?      last year or so  If all above answers are "NO", may proceed with cephalosporin use.  Did it involve swelling of the face/tongue/throat, SOB, or low BP? No  Did it involve sudden or severe rash/hives, skin peeling, or any reaction on the inside of your mouth or nose? No  Did you need to seek medical attention at a hospital or doctor's office? No  When did it last happen?      last year or so  If all above answers are "NO", may proceed with cephalosporin use.   Oxycodone Other (See Comments)    Confusion   Clindamycin/Lincomycin Itching   Nalfon [Fenoprofen Calcium] Hives   Robaxin [Methocarbamol] Hives    History reviewed. No pertinent family history.  Prior to Admission medications   Medication Sig Start Date End Date Taking? Authorizing Provider  acetaminophen (TYLENOL) 650 MG CR tablet Take 650 mg by mouth every 8 (eight) hours as needed for pain.    [provider]  allopurinol (ZYLOPRIM) 300 MG tablet Take 300 mg by mouth every morning.    [provider]  FLUoxetine (PROZAC) 20 MG capsule Take 20 mg by mouth daily.    [provider]  glipiZIDE (GLUCOTROL XL) 5 MG 24 hr tablet Take 5 mg by mouth daily. 01/29/21   [provider]  ibuprofen (ADVIL) 200 MG tablet Take 200-400 mg by mouth every 8 (eight) hours as needed for moderate pain.     [provider]  levothyroxine (SYNTHROID, LEVOTHROID) 125 MCG tablet Take 125 mcg by mouth daily before breakfast.    [provider]  lisinopril (ZESTRIL) 20 MG tablet Take 20 mg by mouth daily.    [provider]  metFORMIN (GLUCOPHAGE) 1000 MG tablet Take 1,000 mg by mouth daily with breakfast.    [provider]  predniSONE (DELTASONE) 1 MG tablet Take 1 mg by mouth daily. 06/22/22   [provider]  rosuvastatin (CRESTOR) 20 MG tablet Take 20 mg by mouth at bedtime. 08/02/22   [provider]  Vibegron (GEMTESA) 75 MG TABS Take 1 tablet (75 mg total) by mouth daily. 04/13/23   Sondra Come, MD    Physical Exam: Vitals:   05/06/23 0804 05/06/23 0806 05/06/23 0830 05/06/23 0841  BP:   (!) 141/87   Pulse:  90 89 88   Resp: (!) 23 19 (!) 23   Temp:    98 F (36.7 C)  TempSrc:    Axillary  SpO2: 99% 99% 97%   Weight:      Height:       Physical Exam Constitutional:      Appearance: He is normal weight.  HENT:     Head: Normocephalic and atraumatic.     Mouth/Throat:     Mouth: Mucous membranes are moist.  Eyes:     Pupils: Pupils are equal, round, and reactive to light.  Cardiovascular:     Rate and Rhythm: Normal rate and regular rhythm.  Pulmonary:     Effort: Pulmonary effort is normal.     Comments: + decreased breath sounds  Musculoskeletal:        General: Normal range of motion.  Skin:    General: Skin is warm.  Neurological:     General: No focal deficit present.  Psychiatric:        Mood and Affect: Mood normal.     Data Reviewed:  There are no new results to review at this time.  CT Angio Chest PE W and/or Wo Contrast CLINICAL DATA:  Shortness of breath.  EXAM: CT ANGIOGRAPHY CHEST WITH CONTRAST  TECHNIQUE: Multidetector CT imaging of the chest was performed using the standard protocol during bolus administration of intravenous contrast. Multiplanar CT image reconstructions and MIPs were obtained to evaluate the  vascular anatomy.  RADIATION DOSE REDUCTION: This exam was performed according to the departmental dose-optimization program which includes automated exposure control, adjustment of the mA and/or kV according to patient size and/or use of iterative reconstruction technique.  CONTRAST:  75mL OMNIPAQUE IOHEXOL 350 MG/ML SOLN  COMPARISON:  March 10, 2023.  FINDINGS: Cardiovascular: Satisfactory opacification of the pulmonary arteries to the segmental level. No evidence of pulmonary embolism. Normal heart size. No pericardial effusion. Coronary artery calcifications are noted.  Mediastinum/Nodes: No enlarged mediastinal, hilar, or axillary lymph nodes. Thyroid gland, trachea, and esophagus demonstrate no significant findings.  Lungs/Pleura: Small bilateral pleural effusions are noted with minimal adjacent subsegmental atelectasis. No pneumothorax is noted. Increased septal thickening is noted throughout both lungs with increased patchy airspace opacities concerning for edema or multifocal pneumonia superimposed upon chronic interstitial lung disease.  Upper Abdomen: No acute abnormality.  Musculoskeletal: No chest wall abnormality. No acute or significant osseous findings.  Review of the MIP images confirms the above findings.  IMPRESSION: No definite evidence of pulmonary embolus.  Small bilateral pleural effusions are noted with minimal adjacent subsegmental atelectasis.  Increased septal thickening is noted throughout both lungs with increased patchy airspace opacities concerning for acute edema or multifocal pneumonia superimposed upon chronic interstitial lung disease.  Coronary artery calcifications are noted.  Aortic Atherosclerosis (ICD10-I70.0).  Electronically Signed   By: Lupita Raider M.D.   On: 05/06/2023 09:18 DG Chest Portable 1 View CLINICAL DATA:  77 year old male with shortness of breath, hypoxia. COPD.  EXAM: PORTABLE CHEST 1  VIEW  COMPARISON:  Chest CT 03/10/2023 and earlier.  FINDINGS: Portable AP upright view at 0641 hours. Bilateral subpleural lung scarring and early lung base honeycombing on the October CT. Compared to the scout view at that time lung volumes are mildly lower and generalized increased pulmonary interstitial opacity has progressed. Blunting of the lung bases appears stable, with no pleural effusion on the prior. No pneumothorax or consolidation. Mediastinal contours are within normal limits. Visualized tracheal air column is within normal  limits. Paucity of bowel gas. No acute osseous abnormality identified.  IMPRESSION: Chronic interstitial lung disease with increased bilateral interstitial pulmonary opacity compared to October CT. Consider superimposed pulmonary edema versus viral/atypical respiratory infection.  Electronically Signed   By: Odessa Fleming M.D.   On: 05/06/2023 07:01  Lab Results  Component Value Date   WBC 4.1 05/06/2023   HGB 11.4 (L) 05/06/2023   HCT 33.6 (L) 05/06/2023   MCV 88.2 05/06/2023   PLT 114 (L) 05/06/2023   Last metabolic panel Lab Results  Component Value Date   GLUCOSE 158 (H) 05/06/2023   NA 136 05/06/2023   K 3.6 05/06/2023   CL 103 05/06/2023   CO2 23 05/06/2023   BUN 28 (H) 05/06/2023   CREATININE 1.45 (H) 05/06/2023   GFRNONAA 49 (L) 05/06/2023   CALCIUM 8.8 (L) 05/06/2023   PROT 7.3 05/06/2023   ALBUMIN 4.1 05/06/2023   BILITOT 0.9 05/06/2023   ALKPHOS 73 05/06/2023   AST 17 05/06/2023   ALT 12 05/06/2023   ANIONGAP 10 05/06/2023    Assessment and Plan: * Acute respiratory failure with hypoxia (HCC) Decompensated respiratory failure initially required BiPAP now transition to 4 L nasal cannula Noted baseline COPD and 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 CT imaging negative for PE but is showing overlapping interstitial lung disease, volume overload and possible pneumonia Will place  on IV Solu-Medrol, DuoNebs IV Rocephin azithromycin for infectious coverage BNP of 424 IV Lasix 2D echo Pulmonology consultation as clinically indicated  Consult cardiology   Elevated brain natriuretic peptide (BNP) level Decompensated respiratory failure now requiring 4 L nasal cannula with noted elevated BNP of 424 and concern for pulmonary edema on imaging Status post IV Lasix in the ER Will check 2D echo to correlate Suspect new onset heart failure versus pulmonary hypertension in setting of underlying interstitial lung disease/COPD Will consult cardiology for formal evaluation Monitor volume status appropriately Follow  COPD (chronic obstructive pulmonary disease) (HCC) Active COPD exacerbation with a baseline interstitial lung disease and overlapping pneumonia with new O2 requirement of 4 L IV Solu-Medrol DuoNebs Pneumonia coverage Monitor respiratory status  Elevated troponin Troponin in the 40s. EKG sinus tach w/ bigeminy  Suspect secondary demand ischemia in setting of decompensated respiratory failure as well as likely new onset heart failure versus pulmonary hypertension Will trend troponin Baby aspirin 2D echo Risk stratification labs Follow-up cardiology recommendations Monitor  Pneumonia Noted new O2 requirement and chest x-ray changes concerning for possible overlapping pneumonia IV Rocephin and azithromycin for infectious coverage Blood and respiratory cultures Monitor  Hyperlipidemia, mixed Continue Crestor  DM type 2 with diabetic mixed hyperlipidemia (HCC) Blood sugar in 150s SSI Monitor blood sugars with steroid use  CKD (chronic kidney disease) stage 3, GFR 30-59 ml/min (HCC) Creatinine 1.45 with GFR in the 40s Appears near baseline Monitor    Greater than 50% was spent in counseling and coordination of care with patient Critical Care time >60 minutes     Advance Care Planning:   Code Status: Full Code   Consults: Cardiology,  pulmonology   Family Communication: Wife at the bedside   Severity of Illness: The appropriate patient status for this patient is INPATIENT. Inpatient status is judged to be reasonable and necessary in order to provide the required intensity of service to ensure the patient's safety. The patient's presenting symptoms, physical exam findings, and initial radiographic and laboratory data in the context of their chronic comorbidities is felt to place  them at high risk for further clinical deterioration. Furthermore, it is not anticipated that the patient will be medically stable for discharge from the hospital within 2 midnights of admission.   * I certify that at the point of admission it is my clinical judgment that the patient will require inpatient hospital care spanning beyond 2 midnights from the point of admission due to high intensity of service, high risk for further deterioration and high frequency of surveillance required.*  Author: Floydene Flock, MD 05/06/2023 10:00 AM  For on call review [email or web address].

## 2023-05-06 NOTE — Assessment & Plan Note (Addendum)
Decompensated respiratory failure initially requiring BiPAP and now back to room air with IV diuresis, noted elevated BNP of 424 and concern for pulmonary edema on imaging.  Concern of NSTEMI Echocardiogram with low normal EF with normal diastolic function, mildly elevated pulmonary pressure.

## 2023-05-06 NOTE — ED Triage Notes (Signed)
Patient brought in via [facility] tonight from home with complaints of shortness of breath and feeling like her can't breathe when he woke up this morning. EMS found O2 sats to be 78%, placed on CPAP, they administered 1 inch nitroglycerin paste. On arrival patient denies Chest pain, only shortness of breath, feels somewhat better on CPAP.

## 2023-05-06 NOTE — Consult Note (Signed)
PHARMACY - ANTICOAGULATION CONSULT NOTE  Pharmacy Consult for Heparin Indication: NSTEMI  Allergies  Allergen Reactions   Amoxicillin-Pot Clavulanate Diarrhea and Nausea And Vomiting    Did it involve swelling of the face/tongue/throat, SOB, or low BP? No  Did it involve sudden or severe rash/hives, skin peeling, or any reaction on the inside of your mouth or nose? No  Did you need to seek medical attention at a hospital or doctor's office? No  When did it last happen?      last year or so  If all above answers are "NO", may proceed with cephalosporin use.  Did it involve swelling of the face/tongue/throat, SOB, or low BP? No  Did it involve sudden or severe rash/hives, skin peeling, or any reaction on the inside of your mouth or nose? No  Did you need to seek medical attention at a hospital or doctor's office? No  When did it last happen?      last year or so  If all above answers are "NO", may proceed with cephalosporin use.   Oxycodone Other (See Comments)    Confusion   Clindamycin/Lincomycin Itching   Nalfon [Fenoprofen Calcium] Hives   Robaxin [Methocarbamol] Hives    Patient Measurements: Height: 5\' 8"  (172.7 cm) Weight: 92 kg (202 lb 13.2 oz) IBW/kg (Calculated) : 68.4 Heparin Dosing Weight: 87.4 kg  Vital Signs: Temp: 97.7 F (36.5 C) (12/26 2101) Temp Source: Oral (12/26 2101) BP: 148/82 (12/26 2000) Pulse Rate: 78 (12/26 2111)  Labs: Recent Labs    05/06/23 0634 05/06/23 0831 05/06/23 1017 05/06/23 1519 05/06/23 1729 05/06/23 2000 05/06/23 2006  HGB 11.4*  --   --   --   --   --   --   HCT 33.6*  --   --   --   --   --   --   PLT 114*  --   --   --   --   --   --   APTT  --   --  31  --   --   --   --   LABPROT  --   --  14.7  --   --   --   --   INR  --   --  1.1  --   --   --   --   HEPARINUNFRC  --   --   --   --   --   --  0.12*  CREATININE 1.45*  --   --   --   --   --   --   TROPONINIHS 44*   < >  --  7,934* 7,467* 6,946*  --    < > =  values in this interval not displayed.    Estimated Creatinine Clearance: 45.5 mL/min (A) (by C-G formula based on SCr of 1.45 mg/dL (H)).   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  Assessment: Christopher Pruitt is a 16 YOM has a PMH of COPD, T2DM, CKD, HLD, and HTN who presented with the ED for respiratory distress. EMS reports finding him hypoxic with sats in the 70's requiring BiPAP and nasal cannular of 4 L/min. Patient's troponin level trending up 44 > 2,272 and elevated BNP of 424 evident of an NSTEMI. CT Chest negative for a PE.   Hbg (11.4) and PLT (114) at baseline. Patient's aPTT (31) and PT (14.7) WNL.   12/27 20:06 HL 0.12, subtherapeutic  Goal of Therapy:  Heparin level 0.3-0.7 units/ml Monitor platelets by anticoagulation protocol: Yes   Plan:  -Heparin level is subtherapeutic -Heparin bolus of 2600 units  -Increase heparin infusion to 1,350 units/hr -Check HL 8 hours after rate increase -CBC daily while on heparin  Clovia Cuff, PharmD, BCPS 05/06/2023 9:26 PM

## 2023-05-06 NOTE — Assessment & Plan Note (Signed)
Continue Crestor 

## 2023-05-06 NOTE — Assessment & Plan Note (Addendum)
Resolved, now back to room air.  Likely due to flash pulmonary edema with NSTEMI. 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 Procalcitonin at 0.11 likely no pneumonia. No wheezing. -Continue with supportive care

## 2023-05-06 NOTE — Assessment & Plan Note (Addendum)
Patient with no wheezing.  CT chest concerning for underlying interstitial lung disease. -Continue with bronchodilator -Holding steroid for now

## 2023-05-06 NOTE — ED Notes (Signed)
Called RT and CT for PT to scan

## 2023-05-06 NOTE — ED Provider Notes (Signed)
[facility] Provider Note    Event Date/Time   First MD Initiated Contact with Patient 05/06/23 [phone]     (approximate)   History   Respiratory Distress   HPI  Christopher Pruitt is a 77 y.o. male who presents to the ED for evaluation of Respiratory Distress   I review a pulmonary clinic visit from October.  History of COPD, DM, CKD, HTN  Patient presents to the ED for evaluation of sudden onset shortness of breath.  Reports he felt fine when he went to bed last night and when he awoke this morning.  He was already up when he developed sudden onset "I just could not breathe."  EMS reports finding him hypoxic with sats in the 70s.  Started on CPAP due to concerns for flash pulmonary edema brought to the ED.  Patient reports improved symptoms with the CPAP.  No pain such as chest pain or back pain.  No syncope.  Just dyspnea, improved with positive pressure.   Physical Exam   Triage Vital Signs: ED Triage Vitals  Encounter Vitals Group     BP      Systolic BP Percentile      Diastolic BP Percentile      Pulse      Resp      Temp      Temp src      SpO2      Weight      Height      Head Circumference      Peak Flow      Pain Score      Pain Loc      Pain Education      Exclude from Growth Chart     Most recent vital signs: Vitals:   05/06/23 0633 05/06/23 0644  BP: (!) 176/94   Pulse: (!) 102   Resp: (!) 25   Temp:  (!) 97.4 F (36.3 C)  SpO2: 99%     General: Awake, no distress.  Well-appearing on CPAP CV:  Good peripheral perfusion.  Tachycardic and regular Resp:  Mild tachypnea to the low/mid 20s.  No distress.  Basilar crackles are noted Abd:  No distention.  MSK:  No deformity noted.  Neuro:  No focal deficits appreciated. Other:     ED Results / Procedures / Treatments   Labs (all labs ordered are listed, but only abnormal results are displayed) Labs Reviewed  COMPREHENSIVE METABOLIC PANEL - Abnormal; Notable for the  following components:      Result Value   Glucose, Bld 158 (*)    BUN 28 (*)    Creatinine, Ser 1.45 (*)    Calcium 8.8 (*)    GFR, Estimated 49 (*)    All other components within normal limits  CBC WITH DIFFERENTIAL/PLATELET - Abnormal; Notable for the following components:   RBC 3.81 (*)    Hemoglobin 11.4 (*)    HCT 33.6 (*)    RDW 15.8 (*)    Platelets 114 (*)    All other components within normal limits  BRAIN NATRIURETIC PEPTIDE - Abnormal; Notable for the following components:   B Natriuretic Peptide 424.9 (*)    All other components within normal limits  MAGNESIUM - Abnormal; Notable for the following components:   Magnesium 1.6 (*)    All other components within normal limits  TROPONIN I (HIGH SENSITIVITY) - Abnormal; Notable for the following components:   Troponin I (High Sensitivity) 44 (*)  All other components within normal limits  CULTURE, BLOOD (ROUTINE X 2)  CULTURE, BLOOD (ROUTINE X 2)  LACTIC ACID, PLASMA  LACTIC ACID, PLASMA    EKG Sinus tachycardia with rate of 105 bpm.  Normal axis.  Partial left bundle.  Multiple PVCs.  Nonspecific changes without clear STEMI.  RADIOLOGY CXR interpreted by me with acute on chronic interstitial changes without discrete lobar infiltration  Official radiology report(s): DG Chest Portable 1 View Result Date: 05/06/2023 CLINICAL DATA:  77 year old male with shortness of breath, hypoxia. COPD. EXAM: PORTABLE CHEST 1 VIEW COMPARISON:  Chest CT 03/10/2023 and earlier. FINDINGS: Portable AP upright view at 0641 hours. Bilateral subpleural lung scarring and early lung base honeycombing on the October CT. Compared to the scout view at that time lung volumes are mildly lower and generalized increased pulmonary interstitial opacity has progressed. Blunting of the lung bases appears stable, with no pleural effusion on the prior. No pneumothorax or consolidation. Mediastinal contours are within normal limits. Visualized tracheal air  column is within normal limits. Paucity of bowel gas. No acute osseous abnormality identified. IMPRESSION: Chronic interstitial lung disease with increased bilateral interstitial pulmonary opacity compared to October CT. Consider superimposed pulmonary edema versus viral/atypical respiratory infection. Electronically Signed   By: Odessa Fleming M.D.   On: 05/06/2023 07:01    PROCEDURES and INTERVENTIONS:  .1-3 Lead EKG Interpretation  Performed by: Delton Prairie, MD Authorized by: Delton Prairie, MD     Interpretation: abnormal     ECG rate:  106   ECG rate assessment: tachycardic     Rhythm: sinus tachycardia     Ectopy: none     Conduction: normal   .Critical Care  Performed by: Delton Prairie, MD Authorized by: Delton Prairie, MD   Critical care provider statement:    Critical care time (minutes):  30   Critical care time was exclusive of:  Separately billable procedures and treating other patients   Critical care was necessary to treat or prevent imminent or life-threatening deterioration of the following conditions:  Respiratory failure   Critical care was time spent personally by me on the following activities:  Development of treatment plan with patient or surrogate, discussions with consultants, evaluation of patient's response to treatment, examination of patient, ordering and review of laboratory studies, ordering and review of radiographic studies, ordering and performing treatments and interventions, pulse oximetry, re-evaluation of patient's condition and review of old charts   Medications - No data to display   IMPRESSION / MDM / ASSESSMENT AND PLAN / ED COURSE  I reviewed the triage vital signs and the nursing notes.  Differential diagnosis includes, but is not limited to, ACS, PTX, PNA, muscle strain/spasm, PE, dissection, anxiety, pleural effusion   {Patient presents with symptoms of an acute illness or injury that is potentially life-threatening.  Patient presents from home  with sudden onset acute hypoxic respiratory failure requiring BiPAP.  Increased chronic interstitial markings on his x-ray but no clear pneumonia.  Has a normal white count and lactic acid, no clear infectious etiology of his symptoms.  First troponin is mildly elevated and we will trend this.  BNP is elevated.  Possibly CHF versus exacerbation of his ILD.  Considering the sudden onset nature of his symptoms, we will obtain a CTA chest to rule out PE.  He will require admission after this.     FINAL CLINICAL IMPRESSION(S) / ED DIAGNOSES   Final diagnoses:  Acute respiratory failure with hypoxia (HCC)  Rx / DC Orders   ED Discharge Orders     None        Note:  This document was prepared using Dragon voice recognition software and may include unintentional dictation errors.   Delton Prairie, MD 05/06/23 [phone]

## 2023-05-06 NOTE — Assessment & Plan Note (Addendum)
Seems well-controlled with A1c of 5.9. -Continue with SSI

## 2023-05-06 NOTE — ED Provider Notes (Addendum)
Patient received in signout from Dr. Katrinka Blazing pending follow-up CTA.  Patient presenting with acute hypoxic respiratory failure has improved now on 4 L nasal cannula.  CTA on my review and interpretation without evidence of PE.  Patient without fever no white count.  BNP is mildly elevated.  Patient given steroid as well as nebs for likely COPD exacerbation but given CT imaging findings also concern for edema will give Lasix.  Troponin is elevated likely secondary to demand ischemia in the setting of acute hypoxic respiratory failure.  Patient is stable for admission to the hospitalist service for further workup and management.  Have consulted with hospitalist for admission.      Willy Eddy, MD 05/06/23 [phone]

## 2023-05-06 NOTE — Consult Note (Cosign Needed)
[facility] CLINIC CARDIOLOGY CONSULT NOTE       Patient ID: Christopher Pruitt MRN: [id number] DOB/AGE: 09-13-1945 77 y.o.  Admit date: 05/06/2023 Referring Physician Dr. Doree Albee Primary Physician Danella Penton, MD  Primary Cardiologist None Reason for Consultation NSTEMI, AoCHF  HPI: Christopher Pruitt is a 77 y.o. male  with a past medical history of COPD/interstitial lung disease, type 2 diabetes, stage III CKD, hypertension, hyperlipidemia who presented to the ED on 05/06/2023 for sudden onset SOB and cough. Cardiology was consulted for further evaluation.   Patient reports that around 5 AM today he woke up and walked to his bathroom and back.  States that at this time he experienced sudden onset shortness of breath.  Also reports a cough which was productive with pink sputum.  He states that throughout the morning his shortness of breath worsened and he decided to call EMS.  He was brought to the ED for further evaluation.  Workup in the ED notable for creatinine 1.45, potassium 3.6, magnesium 1.6, hemoglobin 11.4, WBC 4.1.  Lactic acid 1.4.  BNP elevated at 424.  Troponins trended 44 > 2272.  CT chest demonstrated bilateral pleural effusions as well as acute edema versus pneumonia.  Respiratory viral panel was negative.  He was started on IV Lasix, IV antibiotics, IV heparin in the ED.  At the time of my evaluation this morning patient is resting comfortably in ED stretcher with wife present at bedside.  He reports that overall he feels his breathing has improved.  He denies any episodes of chest pain or other anginal symptoms.  States that prior to his episode of shortness of breath this morning he did not have any exertional symptoms.  States that he is active at home and has no functional limitations.  Denies any episodes of palpitations, dizziness, syncope.  States that overall he has been in good health and has not had any issues recently.  Reports that he follows with pulmonology for  interstitial lung disease.  He is currently on supplemental O2, initially required BiPAP but now weaned to 4 L.  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, ED&C   Urge urinary incontinence    Wears glasses    Wears hearing aid    BILATERAL    Past Surgical History:  Procedure Laterality Date   CARPAL TUNNEL RELEASE Left 10/25/2018   Procedure: CARPAL TUNNEL RELEASE ENDOSCOPIC LEFT, DIABETIC, SLEEPAPNEA;  Surgeon: Christena Flake, MD;  Location: [facility] ORS;  Service: Orthopedics;  Laterality: Left;   CARPAL TUNNEL RELEASE Right 02/01/2019   Procedure: CARPAL TUNNEL RELEASE ENDOSCOPIC;  Surgeon: Christena Flake, MD;  Location: [facility] ORS;  Service: Orthopedics;  Laterality: Right;   colonoscopy with polypectomy     COLONOSCOPY WITH PROPOFOL N/A 07/27/2018   Procedure: COLONOSCOPY WITH PROPOFOL;  Surgeon: Toledo, Boykin Nearing, MD;  Location: [facility] ENDOSCOPY;  Service: Gastroenterology;  Laterality: N/A;   HYDROCELE EXCISION Left 02/11/2015   Procedure: LEFT HYDROCELECTOMY ADULT;  Surgeon: Hildred Laser, MD;  Location: [facility];  Service: Urology;  Laterality: Left;   INGUINAL HERNIA REPAIR Bilateral 04/18/2021   Procedure: LAPAROSCOPIC BILATERAL  HERNIA REPAIR WITH MESH, TRANSABDOMINAL PLANE BLOCK, LEFT INCARCARATED SCROTAL HERNIA, RIGHT INGUINAL HERNIA REPAIR;  Surgeon: Karie Soda, MD;  Location: [facility] OR[facility];  Service: General;  Laterality:  Bilateral;   JOINT REPLACEMENT     Rt knee   PARTIAL KNEE ARTHROPLASTY Right 2012   PLACEMENT GOLD STUDS IN PROSTATE  2014   [place]   for [facility] Rad[facility] (prostate cancer)   SHOULDER OPEN ROTATOR CUFF REPAIR Left 2010   TONSILLECTOMY  as child    (Not in a hospital admission)  Social History   Socioeconomic History   Marital status: Married    Spouse name: Not on file   Number of children: Not on file   Years of education: Not on file   Highest education level: Not on file  Occupational History   Not on file  Tobacco Use   Smoking status: Former    Current packs/day: 0.00    Types: Cigarettes    Start date: 16    Quit date: 1990    Years since quitting: 35.0    Passive exposure: Past   Smokeless tobacco: Never  Vaping Use   Vaping status: Never Used  Substance and Sexual Activity   Alcohol use: No   Drug use: No   Sexual activity: Not on file  Other Topics Concern   Not on file  Social History Narrative   Not on file   Social Drivers of Health   Financial Resource Strain: Low Risk  (03/26/2023)   Received from [facility]   Overall Financial Resource Strain (CARDIA)    Difficulty of Paying Living Expenses: Not hard at all  Food Insecurity: No Food Insecurity (03/26/2023)   Received from [facility]   Hunger Vital Sign    Worried About Running Out of Food in the Last Year: Never true    Ran Out of Food in the Last Year: Never true  Transportation Needs: No Transportation Needs (03/26/2023)   Received from [facility] - Transportation    In the past 12 months, has lack of transportation kept you from medical appointments or from getting medications?: No    Lack of Transportation (Non-Medical): No  Physical Activity: Not on file  Stress: Not on file  Social Connections: Not on file  Intimate Partner Violence: Not on file    History reviewed. No pertinent family history.   Vitals:    05/06/23 0841 05/06/23 1130 05/06/23 1230 05/06/23 1236  BP:  117/72 (!) 115/55   Pulse:  100 91   Resp:  15 15   Temp: 98 F (36.7 C)   98 F (36.7 C)  TempSrc: Axillary   Oral  SpO2:  98% 96%   Weight:      Height:        PHYSICAL EXAM General: Ill appearing male, well nourished, in no acute distress. HEENT: Normocephalic and atraumatic. Neck: No JVD.  Lungs: Normal respiratory effort on 4L [place].  Sounds diminished bilaterally.  Heart:  HRRR. Normal S1 and S2 without gallops or murmurs.  Abdomen: Non-distended appearing.  Msk: Normal strength and tone for age. Extremities: Warm and well perfused. No clubbing, cyanosis. 1+ pitting edema.  Neuro: Alert and oriented X 3. Psych: Answers questions appropriately.   Labs: Basic Metabolic Panel: Recent Labs    05/06/23 0634  NA 136  K 3.6  CL 103  CO2 23  GLUCOSE 158*  BUN 28*  CREATININE 1.45*  CALCIUM 8.8*  MG 1.6*   Liver Function Tests: Recent Labs    05/06/23 0634  AST 17  ALT 12  ALKPHOS 73  BILITOT 0.9  PROT 7.3  ALBUMIN 4.1   No results for input(s): "LIPASE", "AMYLASE" in the last 72 hours. CBC: Recent Labs    05/06/23 0634  WBC 4.1  NEUTROABS 2.5  HGB 11.4*  HCT 33.6*  MCV 88.2  PLT 114*   Cardiac Enzymes: Recent Labs    05/06/23 0634 05/06/23 0831  TROPONINIHS 44* 2,272*   BNP: Recent Labs    05/06/23 0634  BNP 424.9*   D-Dimer: No results for input(s): "DDIMER" in the last 72 hours. Hemoglobin A1C: No results for input(s): "HGBA1C" in the last 72 hours. Fasting Lipid Panel: No results for input(s): "CHOL", "HDL", "LDLCALC", "TRIG", "CHOLHDL", "LDLDIRECT" in the last 72 hours. Thyroid Function Tests: No results for input(s): "TSH", "T4TOTAL", "T3FREE", "THYROIDAB" in the last 72 hours.  Invalid input(s): "FREET3" Anemia Panel: No results for input(s): "VITAMINB12", "FOLATE", "FERRITIN", "TIBC", "IRON", "RETICCTPCT" in the last 72 hours.   Radiology: CT Angio Chest PE W  and/or Wo Contrast Result Date: 05/06/2023 CLINICAL DATA:  Shortness of breath. EXAM: CT ANGIOGRAPHY CHEST WITH CONTRAST TECHNIQUE: Multidetector CT imaging of the chest was performed using the standard protocol during bolus administration of intravenous contrast. Multiplanar CT image reconstructions and MIPs were obtained to evaluate the vascular anatomy. RADIATION DOSE REDUCTION: This exam was performed according to the departmental dose-optimization program which includes automated exposure control, adjustment of the mA and/or kV according to patient size and/or use of iterative reconstruction technique. CONTRAST:  75mL OMNIPAQUE IOHEXOL 350 MG/ML SOLN COMPARISON:  March 10, 2023. FINDINGS: Cardiovascular: Satisfactory opacification of the pulmonary arteries to the segmental level. No evidence of pulmonary embolism. Normal heart size. No pericardial effusion. Coronary artery calcifications are noted. Mediastinum/Nodes: No enlarged mediastinal, hilar, or axillary lymph nodes. Thyroid gland, trachea, and esophagus demonstrate no significant findings. Lungs/Pleura: Small bilateral pleural effusions are noted with minimal adjacent subsegmental atelectasis. No pneumothorax is noted. Increased septal thickening is noted throughout both lungs with increased patchy airspace opacities concerning for edema or multifocal pneumonia superimposed upon chronic interstitial lung disease. Upper Abdomen: No acute abnormality. Musculoskeletal: No chest wall abnormality. No acute or significant osseous findings. Review of the MIP images confirms the above findings. IMPRESSION: No definite evidence of pulmonary embolus. Small bilateral pleural effusions are noted with minimal adjacent subsegmental atelectasis. Increased septal thickening is noted throughout both lungs with increased patchy airspace opacities concerning for acute edema or multifocal pneumonia superimposed upon chronic interstitial lung disease. Coronary artery  calcifications are noted. Aortic Atherosclerosis (ICD10-I70.0). Electronically Signed   By: Lupita Raider M.D.   On: 05/06/2023 09:18   DG Chest Portable 1 View Result Date: 05/06/2023 CLINICAL DATA:  77 year old male with shortness of breath, hypoxia. COPD. EXAM: PORTABLE CHEST 1 VIEW COMPARISON:  Chest CT 03/10/2023 and earlier. FINDINGS: Portable AP upright view at 0641 hours. Bilateral subpleural lung scarring and early lung base honeycombing on the  October CT. Compared to the scout view at that time lung volumes are mildly lower and generalized increased pulmonary interstitial opacity has progressed. Blunting of the lung bases appears stable, with no pleural effusion on the prior. No pneumothorax or consolidation. Mediastinal contours are within normal limits. Visualized tracheal air column is within normal limits. Paucity of bowel gas. No acute osseous abnormality identified. IMPRESSION: Chronic interstitial lung disease with increased bilateral interstitial pulmonary opacity compared to October CT. Consider superimposed pulmonary edema versus viral/atypical respiratory infection. Electronically Signed   By: Odessa Fleming M.D.   On: 05/06/2023 07:01    ECHO pending  TELEMETRY reviewed by me 05/06/2023: sinus rhythm rate 90s  EKG reviewed by me: sinus tachycardia PVCs rate 105 bpm  Data reviewed by me 05/06/2023: last 24h vitals tele labs imaging I/O ED provider note, admission H&P  Principal Problem:   Acute respiratory failure with hypoxia (HCC) Active Problems:   CKD (chronic kidney disease) stage 3, GFR 30-59 ml/min (HCC)   COPD (chronic obstructive pulmonary disease) (HCC)   DM type 2 with diabetic mixed hyperlipidemia (HCC)   Hyperlipidemia, mixed   Elevated brain natriuretic peptide (BNP) level   Pneumonia   Elevated troponin    ASSESSMENT AND PLAN:  Christopher Pruitt is a 77 y.o. male  with a past medical history of COPD/interstitial lung disease, type 2 diabetes, stage III CKD,  hypertension, hyperlipidemia who presented to the ED on 05/06/2023 for sudden onset SOB and cough. Cardiology was consulted for further evaluation.   # Acute heart failure preserved EF # Acute hypoxic respiratory failure # COPD/ILD Patient presented with acute onset shortness of breath and cough earlier this morning.  BNP found to be elevated at 424.  CT chest with pulmonary edema versus pneumonia.  Echo from 2022 with preserved EF. -Echo pending. -Continue IV Lasix 40 mg twice daily. -Management of possible pneumonia per primary. -Wean O2 as tolerated.  # NSTEMI # Coronary artery calcification by CT Patient presented with acute hypoxic respiratory failure/acute heart failure as above.  Troponins checked and trended 44 > 2272.  -Continue to trend troponin. -Heparin infusion.  -Plan for heart catheterization Monday morning with Dr. Darrold Junker for further evaluation.  # Chronic kidney disease stage III Patient with known history of CKD stage III.  Creatinine on admission 1.45. -Continue to monitor renal function closely with diuresis. -Monitor and replenish electrolytes for a goal K >4, Mag >2     TIMI Risk Score for Unstable Angina or Non-ST Elevation MI:   The patient's TIMI risk score is 3, which indicates a 13% risk of all cause mortality, new or recurrent myocardial infarction or need for urgent revascularization in the next 14 days.   This patient's plan of care was discussed and created with Dr. Darrold Junker and he is in agreement.  Signed: Gale Journey, PA-C  05/06/2023, 1:36 PM [facility] Cardiology

## 2023-05-06 NOTE — Assessment & Plan Note (Addendum)
Troponin peaked around 8000. EKG sinus tach w/ bigeminy  Cardiology is on board and patient was started on heparin infusion. No chest pain, echocardiogram with low normal EF and no regional wall motion abnormalities. Cardiology is planning to do cardiac cath on Monday. -Continue with heparin infusion

## 2023-05-06 NOTE — Assessment & Plan Note (Addendum)
Slight worsening creatinine at 1.61, GFR in the 40s Appears near baseline Monitor renal function Avoid nephrotoxins

## 2023-05-06 NOTE — Consult Note (Signed)
PHARMACY - ANTICOAGULATION CONSULT NOTE  Pharmacy Consult for Heparin Indication: NSTEMI  Allergies  Allergen Reactions   Amoxicillin-Pot Clavulanate Diarrhea and Nausea And Vomiting    Did it involve swelling of the face/tongue/throat, SOB, or low BP? No  Did it involve sudden or severe rash/hives, skin peeling, or any reaction on the inside of your mouth or nose? No  Did you need to seek medical attention at a hospital or doctor's office? No  When did it last happen?      last year or so  If all above answers are "NO", may proceed with cephalosporin use.  Did it involve swelling of the face/tongue/throat, SOB, or low BP? No  Did it involve sudden or severe rash/hives, skin peeling, or any reaction on the inside of your mouth or nose? No  Did you need to seek medical attention at a hospital or doctor's office? No  When did it last happen?      last year or so  If all above answers are "NO", may proceed with cephalosporin use.   Oxycodone Other (See Comments)    Confusion   Clindamycin/Lincomycin Itching   Nalfon [Fenoprofen Calcium] Hives   Robaxin [Methocarbamol] Hives    Patient Measurements: Height: 5\' 8"  (172.7 cm) Weight: 92 kg (202 lb 13.2 oz) IBW/kg (Calculated) : 68.4 Heparin Dosing Weight: 87.4 kg  Vital Signs: Temp: 98 F (36.7 C) (12/26 0841) Temp Source: Axillary (12/26 0841) BP: 141/87 (12/26 0830) Pulse Rate: 88 (12/26 0830)  Labs: Recent Labs    05/06/23 0634 05/06/23 0831  HGB 11.4*  --   HCT 33.6*  --   PLT 114*  --   CREATININE 1.45*  --   TROPONINIHS 44* 2,272*    Estimated Creatinine Clearance: 45.5 mL/min (A) (by C-G formula based on SCr of 1.45 mg/dL (H)).   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  Assessment: Christopher Pruitt is a 26 YOM has a PMH of COPD, T2DM, CKD, HLD, and HTN who presented with the ED for respiratory distress. EMS reports finding him hypoxic with sats in the 70's requiring BiPAP and nasal cannular of 4 L/min. Patient's troponin level trending up 44 > 2,272 and elevated BNP of 424 evident of an NSTEMI. CT Chest negative for a PE.   Hbg (11.4) and PLT (114) at baseline. Patient's aPTT (31) and PT (14.7) WNL.   Goal of Therapy:  Heparin level 0.3-0.7 units/ml Monitor platelets by anticoagulation protocol: Yes   Plan:  -Give Heparin bolus of 4,000 units followed by heparin infusion of 1,100 units/hr -Check  HL 8 hours after start of infusion (@1900 ) -CBC daily while on heparin  Kipp Laurence, PharmD Candidate 05/06/2023,10:16 AM

## 2023-05-07 ENCOUNTER — Inpatient Hospital Stay
Admit: 2023-05-07 | Discharge: 2023-05-07 | Disposition: A | Discharge status: Home / Self Care | Payer: Medicare Other | Attending: Student | Admitting: Student

## 2023-05-07 DIAGNOSIS — J449 Chronic obstructive pulmonary disease, unspecified: Secondary | ICD-10-CM

## 2023-05-07 DIAGNOSIS — E039 Hypothyroidism, unspecified: Secondary | ICD-10-CM

## 2023-05-07 DIAGNOSIS — R7989 Other specified abnormal findings of blood chemistry: Secondary | ICD-10-CM | POA: Diagnosis not present

## 2023-05-07 DIAGNOSIS — I214 Non-ST elevation (NSTEMI) myocardial infarction: Secondary | ICD-10-CM | POA: Diagnosis not present

## 2023-05-07 DIAGNOSIS — N1831 Chronic kidney disease, stage 3a: Secondary | ICD-10-CM

## 2023-05-07 DIAGNOSIS — I10 Essential (primary) hypertension: Secondary | ICD-10-CM

## 2023-05-07 DIAGNOSIS — Z8546 Personal history of malignant neoplasm of prostate: Secondary | ICD-10-CM

## 2023-05-07 DIAGNOSIS — E782 Mixed hyperlipidemia: Secondary | ICD-10-CM

## 2023-05-07 DIAGNOSIS — J9601 Acute respiratory failure with hypoxia: Secondary | ICD-10-CM | POA: Diagnosis not present

## 2023-05-07 LAB — CBC
HCT: 27.8 % — ABNORMAL LOW (ref 39.0–52.0)
Hemoglobin: 9.5 g/dL — ABNORMAL LOW (ref 13.0–17.0)
MCH: 29.5 pg (ref 26.0–34.0)
MCHC: 34.2 g/dL (ref 30.0–36.0)
MCV: 86.3 fL (ref 80.0–100.0)
Platelets: 111 10*3/uL — ABNORMAL LOW (ref 150–400)
RBC: 3.22 MIL/uL — ABNORMAL LOW (ref 4.22–5.81)
RDW: 15.7 % — ABNORMAL HIGH (ref 11.5–15.5)
WBC: 4.7 10*3/uL (ref 4.0–10.5)
nRBC: 0 % (ref 0.0–0.2)

## 2023-05-07 LAB — COMPREHENSIVE METABOLIC PANEL
ALT: 14 U/L (ref 0–44)
AST: 30 U/L (ref 15–41)
Albumin: 3.9 g/dL (ref 3.5–5.0)
Alkaline Phosphatase: 67 U/L (ref 38–126)
Anion gap: 13 (ref 5–15)
BUN: 34 mg/dL — ABNORMAL HIGH (ref 8–23)
CO2: 22 mmol/L (ref 22–32)
Calcium: 9 mg/dL (ref 8.9–10.3)
Chloride: 103 mmol/L (ref 98–111)
Creatinine, Ser: 1.61 mg/dL — ABNORMAL HIGH (ref 0.61–1.24)
GFR, Estimated: 43 mL/min — ABNORMAL LOW (ref >60)
Glucose, Bld: 184 mg/dL — ABNORMAL HIGH (ref 70–99)
Potassium: 4.3 mmol/L (ref 3.5–5.1)
Sodium: 138 mmol/L (ref 135–145)
Total Bilirubin: 1 mg/dL (ref <1.2)
Total Protein: 7 g/dL (ref 6.5–8.1)

## 2023-05-07 LAB — ECHOCARDIOGRAM COMPLETE
AR max vel: 1.94 cm2
AV Area VTI: 2.02 cm2
AV Area mean vel: 1.83 cm2
AV Mean grad: 14.3 mm[Hg]
AV Peak grad: 26 mm[Hg]
Ao pk vel: 2.55 m/s
Area-P 1/2: 4.83 cm2
Height: 68 in
MV VTI: 3.65 cm2
S' Lateral: 3.6 cm
Weight: 3245.17 [oz_av]

## 2023-05-07 LAB — LEGIONELLA PNEUMOPHILA SEROGP 1 UR AG: L. pneumophila Serogp 1 Ur Ag: NEGATIVE

## 2023-05-07 LAB — PROCALCITONIN: Procalcitonin: 0.11 ng/mL

## 2023-05-07 LAB — CBG MONITORING, ED
Glucose-Capillary: 159 mg/dL — ABNORMAL HIGH (ref 70–99)
Glucose-Capillary: 165 mg/dL — ABNORMAL HIGH (ref 70–99)
Glucose-Capillary: 184 mg/dL — ABNORMAL HIGH (ref 70–99)

## 2023-05-07 LAB — HEPARIN LEVEL (UNFRACTIONATED)
Heparin Unfractionated: 0.17 [IU]/mL — ABNORMAL LOW (ref 0.30–0.70)
Heparin Unfractionated: 0.43 [IU]/mL (ref 0.30–0.70)

## 2023-05-07 MED ORDER — LEVOTHYROXINE SODIUM 25 MCG PO TABS
125.0000 ug | ORAL_TABLET | Freq: Every day | ORAL | Status: DC
  Administered 2023-05-08 – 2023-05-10 (×3): 125 ug via ORAL
  Filled 2023-05-07: qty 1
  Filled 2023-05-07: qty 3
  Filled 2023-05-07: qty 1

## 2023-05-07 MED ORDER — PERFLUTREN LIPID MICROSPHERE
1.0000 mL | INTRAVENOUS | Status: AC | PRN
  Administered 2023-05-07: 5 mL via INTRAVENOUS

## 2023-05-07 MED ORDER — ALLOPURINOL 100 MG PO TABS
300.0000 mg | ORAL_TABLET | Freq: Every morning | ORAL | Status: DC
  Administered 2023-05-07 – 2023-05-10 (×4): 300 mg via ORAL
  Filled 2023-05-07: qty 3
  Filled 2023-05-07: qty 1
  Filled 2023-05-07: qty 3
  Filled 2023-05-07: qty 1

## 2023-05-07 MED ORDER — ROSUVASTATIN CALCIUM 10 MG PO TABS
20.0000 mg | ORAL_TABLET | Freq: Every day | ORAL | Status: DC
  Administered 2023-05-07 – 2023-05-09 (×3): 20 mg via ORAL
  Filled 2023-05-07 (×2): qty 2
  Filled 2023-05-07: qty 1

## 2023-05-07 MED ORDER — HEPARIN BOLUS VIA INFUSION
2600.0000 [IU] | Freq: Once | INTRAVENOUS | Status: AC
  Administered 2023-05-07: 2600 [IU] via INTRAVENOUS
  Filled 2023-05-07: qty 2600

## 2023-05-07 MED ORDER — FLUOXETINE HCL 20 MG PO CAPS: 20.0000 mg | ORAL_CAPSULE | Freq: Every day | ORAL | Status: DC

## 2023-05-07 NOTE — Assessment & Plan Note (Signed)
Blood pressure currently within goal. -Holding home lisinopril -Continue to monitor

## 2023-05-07 NOTE — Progress Notes (Signed)
Heart Failure Navigator Progress Note  Assessed for Heart & Vascular TOC clinic readiness.  Patient does not meet criteria due to current [facility] patient.   Navigator will sign off at this time.  Roxy Horseman, RN, BSN [facility] Heart Failure Navigator Secure Chat Only

## 2023-05-07 NOTE — Progress Notes (Cosign Needed)
[facility] CLINIC CARDIOLOGY PROGRESS NOTE       Patient ID: Christopher Pruitt MRN: [id number] DOB/AGE: May 19, 1945 77 y.o.  Admit date: 05/06/2023 Referring Physician Dr. Doree Albee Primary Physician Danella Penton, MD  Primary Cardiologist None Reason for Consultation NSTEMI, AoCHF  HPI: Christopher Pruitt is a 78 y.o. male  with a past medical history of COPD/interstitial lung disease, type 2 diabetes, stage III CKD, hypertension, hyperlipidemia who presented to the ED on 05/06/2023 for sudden onset SOB and cough. Cardiology was consulted for further evaluation.   Intervel history: -Patient seen and examined this AM, reports breathing improved but still not back to baseline, currently on 2L [place].  -Troponins peaked at 7900 yesterday. He denies any chest pain/anginal symptoms.  -BP and HR remain controlled.   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, ED&C   Urge urinary incontinence    Wears glasses    Wears hearing aid    BILATERAL    Past Surgical History:  Procedure Laterality Date   CARPAL TUNNEL RELEASE Left 10/25/2018   Procedure: CARPAL TUNNEL RELEASE ENDOSCOPIC LEFT, DIABETIC, SLEEPAPNEA;  Surgeon: Christena Flake, MD;  Location: [facility] ORS;  Service: Orthopedics;  Laterality: Left;   CARPAL TUNNEL RELEASE Right 02/01/2019   Procedure: CARPAL TUNNEL RELEASE ENDOSCOPIC;  Surgeon: Christena Flake, MD;  Location: [facility] ORS;  Service: Orthopedics;   Laterality: Right;   colonoscopy with polypectomy     COLONOSCOPY WITH PROPOFOL N/A 07/27/2018   Procedure: COLONOSCOPY WITH PROPOFOL;  Surgeon: Toledo, Boykin Nearing, MD;  Location: [facility] ENDOSCOPY;  Service: Gastroenterology;  Laterality: N/A;   HYDROCELE EXCISION Left 02/11/2015   Procedure: LEFT HYDROCELECTOMY ADULT;  Surgeon: Hildred Laser, MD;  Location[facility];  Service: Urology;  Laterality: Left;   INGUINAL HERNIA REPAIR Bilateral 04/18/2021   Procedure: LAPAROSCOPIC BILATERAL  HERNIA REPAIR WITH MESH, TRANSABDOMINAL PLANE BLOCK, LEFT INCARCARATED SCROTAL HERNIA, RIGHT INGUINAL HERNIA REPAIR;  Surgeon: Karie Soda, MD;  Location: [facility] ORS;  Service: General;  Laterality: Bilateral;   JOINT REPLACEMENT     Rt knee   PARTIAL KNEE ARTHROPLASTY Right 2012   PLACEMENT GOLD STUDS IN PROSTATE  2014   [place]   for [facility] Radiation (prostate cancer)   SHOULDER OPEN ROTATOR CUFF REPAIR Left 2010   TONSILLECTOMY  as child    (Not in a hospital admission)  Social History   Socioeconomic History   Marital status: Married    Spouse name: Not on file   Number of children: Not on file   Years of education: Not on file   Highest education level: Not on file  Occupational History   Not on file  Tobacco Use   Smoking status: Former    Current packs/day: 0.00    Types: Cigarettes    Start date: 35    Quit date: 1990    Years since quitting: 35.0    Passive exposure: Past   Smokeless tobacco: Never  Vaping Use   Vaping status: Never Used  Substance and Sexual Activity   Alcohol use: No   Drug use: No   Sexual activity: Not on file  Other Topics Concern   Not on file  Social History Narrative   Not on file   Social Drivers of Health   Financial Resource Strain: Low Risk  (03/26/2023)   Received from [facility]   Overall Financial Resource Strain (CARDIA)    Difficulty of Paying Living Expenses: Not hard at all  Food Insecurity:  No Food Insecurity (03/26/2023)   Received from [facility]   Hunger Vital Sign    Worried About Running Out of Food in the Last Year: Never true    Ran Out of Food in the Last Year: Never true  Transportation Needs: No Transportation Needs (03/26/2023)   Received from [facility] - Transportation    In the past 12 months, has lack of transportation kept you from medical appointments or from getting medications?: No    Lack of Transportation (Non-Medical): No  Physical Activity: Not on file  Stress: Not on file  Social Connections: Not on file  Intimate Partner Violence: Not on file    History reviewed. No pertinent family history.   Vitals:   05/07/23 0700 05/07/23 0730 05/07/23 0800 05/07/23 0906  BP: 115/71 106/63    Pulse: 76 77 93   Resp: 14 18 13    Temp:    97.6 F (36.4 C)  TempSrc:    Oral  SpO2: 96% 92% 100%   Weight:      Height:        PHYSICAL EXAM General: Ill appearing male, well nourished, in no acute distress. HEENT: Normocephalic and atraumatic. Neck: No JVD.  Lungs: Normal respiratory effort on 2L .  Sounds diminished bilaterally.  Heart: HRRR. Normal S1 and S2 without gallops or murmurs.  Abdomen: Non-distended appearing.  Msk: Normal strength and tone for age. Extremities: Warm and well perfused. No clubbing, cyanosis. Trace pitting edema.  Neuro: Alert and oriented X 3. Psych: Answers questions appropriately.   Labs: Basic Metabolic Panel: Recent Labs    05/06/23 0634 05/07/23 0612  NA 136 138  K 3.6 4.3  CL 103 103  CO2 23 22  GLUCOSE 158* 184*  BUN 28* 34*  CREATININE 1.45* 1.61*  CALCIUM 8.8* 9.0  MG 1.6*  --    Liver Function Tests: Recent Labs    05/06/23 0634 05/07/23 0612  AST 17 30  ALT 12 14  ALKPHOS 73 67  BILITOT 0.9 1.0  PROT 7.3 7.0  ALBUMIN 4.1 3.9   No results for input(s): "LIPASE", "AMYLASE" in the last 72 hours. CBC: Recent Labs    05/06/23 0634  05/07/23 0612  WBC 4.1 4.7  NEUTROABS 2.5  --   HGB 11.4* 9.5*  HCT 33.6* 27.8*  MCV 88.2 86.3  PLT 114* 111*   Cardiac Enzymes: Recent Labs    05/06/23 1519 05/06/23 1729 05/06/23 2000  TROPONINIHS 7,934* 7,467* 6,946*   BNP: Recent Labs    05/06/23 0634  BNP 424.9*   D-Dimer: No results for input(s): "DDIMER" in the last 72  hours. Hemoglobin A1C: Recent Labs    05/06/23 1002  HGBA1C 5.9*   Fasting Lipid Panel: No results for input(s): "CHOL", "HDL", "LDLCALC", "TRIG", "CHOLHDL", "LDLDIRECT" in the last 72 hours. Thyroid Function Tests: No results for input(s): "TSH", "T4TOTAL", "T3FREE", "THYROIDAB" in the last 72 hours.  Invalid input(s): "FREET3" Anemia Panel: No results for input(s): "VITAMINB12", "FOLATE", "FERRITIN", "TIBC", "IRON", "RETICCTPCT" in the last 72 hours.   Radiology: CT Angio Chest PE W and/or Wo Contrast Result Date: 05/06/2023 CLINICAL DATA:  Shortness of breath. EXAM: CT ANGIOGRAPHY CHEST WITH CONTRAST TECHNIQUE: Multidetector CT imaging of the chest was performed using the standard protocol during bolus administration of intravenous contrast. Multiplanar CT image reconstructions and MIPs were obtained to evaluate the vascular anatomy. RADIATION DOSE REDUCTION: This exam was performed according to the departmental dose-optimization program which includes automated exposure control, adjustment of the mA and/or kV according to patient size and/or use of iterative reconstruction technique. CONTRAST:  75mL OMNIPAQUE IOHEXOL 350 MG/ML SOLN COMPARISON:  March 10, 2023. FINDINGS: Cardiovascular: Satisfactory opacification of the pulmonary arteries to the segmental level. No evidence of pulmonary embolism. Normal heart size. No pericardial effusion. Coronary artery calcifications are noted. Mediastinum/Nodes: No enlarged mediastinal, hilar, or axillary lymph nodes. Thyroid gland, trachea, and esophagus demonstrate no significant findings. Lungs/Pleura: Small  bilateral pleural effusions are noted with minimal adjacent subsegmental atelectasis. No pneumothorax is noted. Increased septal thickening is noted throughout both lungs with increased patchy airspace opacities concerning for edema or multifocal pneumonia superimposed upon chronic interstitial lung disease. Upper Abdomen: No acute abnormality. Musculoskeletal: No chest wall abnormality. No acute or significant osseous findings. Review of the MIP images confirms the above findings. IMPRESSION: No definite evidence of pulmonary embolus. Small bilateral pleural effusions are noted with minimal adjacent subsegmental atelectasis. Increased septal thickening is noted throughout both lungs with increased patchy airspace opacities concerning for acute edema or multifocal pneumonia superimposed upon chronic interstitial lung disease. Coronary artery calcifications are noted. Aortic Atherosclerosis (ICD10-I70.0). Electronically Signed   By: Lupita Raider M.D.   On: 05/06/2023 09:18   DG Chest Portable 1 View Result Date: 05/06/2023 CLINICAL DATA:  77 year old male with shortness of breath, hypoxia. COPD. EXAM: PORTABLE CHEST 1 VIEW COMPARISON:  Chest CT 03/10/2023 and earlier. FINDINGS: Portable AP upright view at 0641 hours. Bilateral subpleural lung scarring and early lung base honeycombing on the October CT. Compared to the scout view at that time lung volumes are mildly lower and generalized increased pulmonary interstitial opacity has progressed. Blunting of the lung bases appears stable, with no pleural effusion on the prior. No pneumothorax or consolidation. Mediastinal contours are within normal limits. Visualized tracheal air column is within normal limits. Paucity of bowel gas. No acute osseous abnormality identified. IMPRESSION: Chronic interstitial lung disease with increased bilateral interstitial pulmonary opacity compared to October CT. Consider superimposed pulmonary edema versus viral/atypical  respiratory infection. Electronically Signed   By: Odessa Fleming M.D.   On: 05/06/2023 07:01    ECHO pending  TELEMETRY reviewed by me 05/07/2023: sinus rhythm rate 80-90s  EKG reviewed by me: sinus tachycardia PVCs rate 105 bpm  Data reviewed by me 05/07/2023: last 24h vitals tele labs imaging I/O hospitalist progress note  Principal Problem:   Acute respiratory failure with hypoxia (HCC) Active Problems:   CKD (chronic kidney disease) stage 3, GFR 30-59 ml/min (HCC)   COPD (chronic obstructive pulmonary disease) (HCC)   DM type 2 with diabetic mixed hyperlipidemia (HCC)   Hyperlipidemia, mixed  Elevated brain natriuretic peptide (BNP) level   Pneumonia   Elevated troponin    ASSESSMENT AND PLAN:  Christopher Pruitt is a 77 y.o. male  with a past medical history of COPD/interstitial lung disease, type 2 diabetes, stage III CKD, hypertension, hyperlipidemia who presented to the ED on 05/06/2023 for sudden onset SOB and cough. Cardiology was consulted for further evaluation.   # Acute heart failure preserved EF # Acute hypoxic respiratory failure # COPD/ILD Patient presented with acute onset shortness of breath and cough earlier this morning.  BNP found to be elevated at 424.  CT chest with pulmonary edema versus pneumonia.  Echo from 2022 with preserved EF. -Echo pending. -Continue IV Lasix 40 mg twice daily. -Management of possible pneumonia per primary. -Wean O2 as tolerated.  # NSTEMI # Coronary artery calcification by CT Patient presented with acute hypoxic respiratory failure/acute heart failure as above.  Troponins trended 44 > 2272 > 7934 > 7467 > 6946.  -Continue to trend troponin. -Heparin infusion.  -Plan for heart catheterization Monday morning with Dr. Darrold Junker for further evaluation. Patient and wife aware and agree with plan.  # Chronic kidney disease stage III Patient with known history of CKD stage III.  Creatinine on admission 1.45. -Continue to monitor renal  function closely with diuresis. -Monitor and replenish electrolytes for a goal K >4, Mag >2     TIMI Risk Score for Unstable Angina or Non-ST Elevation MI:   The patient's TIMI risk score is 3, which indicates a 13% risk of all cause mortality, new or recurrent myocardial infarction or need for urgent revascularization in the next 14 days.   This patient's plan of care was discussed and created with Dr. Darrold Junker and he is in agreement.  Signed: Gale Journey, PA-C  05/07/2023, 9:25 AM [facility] Cardiology

## 2023-05-07 NOTE — Assessment & Plan Note (Signed)
-   Continue home Synthroid °

## 2023-05-07 NOTE — Progress Notes (Signed)
*  PRELIMINARY RESULTS* Echocardiogram 2D Echocardiogram has been performed.  Carolyne Fiscal 05/07/2023, 11:01 AM

## 2023-05-07 NOTE — Consult Note (Signed)
PHARMACY - ANTICOAGULATION CONSULT NOTE  Pharmacy Consult for Heparin Indication: NSTEMI  Allergies  Allergen Reactions   Amoxicillin-Pot Clavulanate Diarrhea and Nausea And Vomiting    Did it involve swelling of the face/tongue/throat, SOB, or low BP? No  Did it involve sudden or severe rash/hives, skin peeling, or any reaction on the inside of your mouth or nose? No  Did you need to seek medical attention at a hospital or doctor's office? No  When did it last happen?      last year or so  If all above answers are "NO", may proceed with cephalosporin use.  Did it involve swelling of the face/tongue/throat, SOB, or low BP? No  Did it involve sudden or severe rash/hives, skin peeling, or any reaction on the inside of your mouth or nose? No  Did you need to seek medical attention at a hospital or doctor's office? No  When did it last happen?      last year or so  If all above answers are "NO", may proceed with cephalosporin use.   Oxycodone Other (See Comments)    Confusion   Clindamycin/Lincomycin Itching   Nalfon [Fenoprofen Calcium] Hives   Robaxin [Methocarbamol] Hives    Patient Measurements: Height: 5\' 8"  (172.7 cm) Weight: 92 kg (202 lb 13.2 oz) IBW/kg (Calculated) : 68.4 Heparin Dosing Weight: 87.4 kg  Vital Signs: Temp: 97.6 F (36.4 C) (12/27 0906) Temp Source: Oral (12/27 0906) BP: 123/71 (12/27 1530) Pulse Rate: 93 (12/27 1530)  Labs: Recent Labs    05/06/23 0634 05/06/23 0831 05/06/23 1017 05/06/23 1519 05/06/23 1729 05/06/23 2000 05/06/23 2006 05/07/23 0612 05/07/23 1633  HGB 11.4*  --   --   --   --   --   --  9.5*  --   HCT 33.6*  --   --   --   --   --   --  27.8*  --   PLT 114*  --   --   --   --   --   --  111*  --   APTT  --   --  31  --   --   --   --   --   --   LABPROT  --   --  14.7  --   --   --   --   --   --   INR  --   --  1.1  --   --   --   --   --   --   HEPARINUNFRC  --   --   --   --   --   --  0.12* 0.17* 0.43  CREATININE  1.45*  --   --   --   --   --   --  1.61*  --   TROPONINIHS 44*   < >  --  7,934* 7,467* 1,610*  --   --   --    < > = values in this interval not displayed.    Estimated Creatinine Clearance: 40.9 mL/min (A) (by C-G formula based on SCr of 1.61 mg/dL (H)).   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  Assessment: Christopher Pruitt is a 78 YOM has a PMH of COPD, T2DM, CKD, HLD, and HTN who presented with the ED for respiratory distress. EMS reports finding him hypoxic with sats in the 70's requiring BiPAP and nasal cannular of 4 L/min. Patient's troponin level trending up 44 > 2,272 and elevated BNP of 424 evident of an NSTEMI. CT Chest negative for a PE.   Hbg (11.4) and PLT (114) at baseline. Patient's aPTT (31) and PT (14.7) WNL.   12/27 20:06 HL 0.12, subtherapeutic 12/27 0612 HL 0.17, subtherapeutic 12/27 1633 HL 0.43, therapeutic x 1  Goal of Therapy:  Heparin level 0.3-0.7 units/ml Monitor platelets by anticoagulation protocol: Yes   Plan:  -Heparin level is therapeutic -Continue heparin infusion at a rate of 1600 units/hr -Check confirmatory HL in 8 hours  -CBC daily while on heparin  Paulita Fujita, PharmD Clinical Pharmacist 05/07/2023 4:54 PM

## 2023-05-07 NOTE — Hospital Course (Addendum)
Taken from H&P.  Christopher Pruitt is a 77 y.o. male with medical history significant of COPD/interstitial lung disease, type 2 diabetes, stage III CKD, hypertension, hyperlipidemia presenting with acute respiratory failure with hypoxia, elevated BNP, pneumonia, elevated troponin.  Patient reports sudden onset of cough and shortness of breath earlier this morning.  No chest pain.  Has some?  Trace hemoptysis with coughing.  Mild wheezing.  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   Patient initially required BiPAP, able to transition to 2 L this morning.  BNP 425, troponin 44 >>2272-cardiology was consulted and he was started on heparin infusion.  CTA negative for PE but did show pulmonary edema.  Also started on IV diuresis and antibiotics until pneumonia ruled out.  12/27: Vital stable on room air now.  COVID-19, influenza and RSV negative, Troponin peaked at 7934, strep pneumo negative, procalcitonin 0.11 Hemoglobin decreased to 9.7 from 11.4 on admission without any obvious bleeding, slight worsening of creatinine to 1.61.  Doubt this is any upper respiratory infection or pneumonia, likely NSTEMI with pulmonary edema.  Echocardiogram with low normal EF and no regional wall motion abnormalities.  Discussed with cardiology but they are planning to do cardiac cath on Monday stating that it is not emergent.  12/28: Vitals and labs mostly stable, small improvement in creatinine to 1.54, troponin continued to trend down after peaking at 7934.  Procalcitonin remain negative.  Patient remained on heparin infusion.  Cardiac cath on Monday.  Few basal crackles so giving 1 more dose of IV Lasix  12/29: Remained hemodynamically stable, cardiac cath tomorrow.  12/30: Hemodynamically remained stable, heart catheterization today with diffuse disease and they were unable to locate the culprit for this current NSTEMI.  As patient remained chest pain-free they are  recommending outpatient evaluation at [facility] for CABG.  Patient was started on low-dose aspirin, added low-dose Lasix and metoprolol, home lisinopril dose was decreased to 10 mg daily to accommodate Lasix and metoprolol.  Patient will continue on current medications and need to have a close follow-up with his providers for further management.

## 2023-05-07 NOTE — ED Notes (Signed)
Pt reports that his breathing is good, bipap removed and pt placed on 2L via [place] to eat breakfast, pt able to sit upright and help himself to his tray, tray set up and covers removed for pt to have easy access to his meal, ice water given in place of coffee per pt's request

## 2023-05-07 NOTE — Consult Note (Signed)
PHARMACY - ANTICOAGULATION CONSULT NOTE  Pharmacy Consult for Heparin Indication: NSTEMI  Allergies  Allergen Reactions   Amoxicillin-Pot Clavulanate Diarrhea and Nausea And Vomiting    Did it involve swelling of the face/tongue/throat, SOB, or low BP? No  Did it involve sudden or severe rash/hives, skin peeling, or any reaction on the inside of your mouth or nose? No  Did you need to seek medical attention at a hospital or doctor's office? No  When did it last happen?      last year or so  If all above answers are "NO", may proceed with cephalosporin use.  Did it involve swelling of the face/tongue/throat, SOB, or low BP? No  Did it involve sudden or severe rash/hives, skin peeling, or any reaction on the inside of your mouth or nose? No  Did you need to seek medical attention at a hospital or doctor's office? No  When did it last happen?      last year or so  If all above answers are "NO", may proceed with cephalosporin use.   Oxycodone Other (See Comments)    Confusion   Clindamycin/Lincomycin Itching   Nalfon [Fenoprofen Calcium] Hives   Robaxin [Methocarbamol] Hives    Patient Measurements: Height: 5\' 8"  (172.7 cm) Weight: 92 kg (202 lb 13.2 oz) IBW/kg (Calculated) : 68.4 Heparin Dosing Weight: 87.4 kg  Vital Signs: Temp: 98.2 F (36.8 C) (12/27 0328) Temp Source: Oral (12/27 0328) BP: 120/65 (12/27 0615) Pulse Rate: 84 (12/27 0615)  Labs: Recent Labs    05/06/23 0634 05/06/23 0831 05/06/23 1017 05/06/23 1519 05/06/23 1729 05/06/23 2000 05/06/23 2006 05/07/23 0612  HGB 11.4*  --   --   --   --   --   --  9.5*  HCT 33.6*  --   --   --   --   --   --  27.8*  PLT 114*  --   --   --   --   --   --  111*  APTT  --   --  31  --   --   --   --   --   LABPROT  --   --  14.7  --   --   --   --   --   INR  --   --  1.1  --   --   --   --   --   HEPARINUNFRC  --   --   --   --   --   --  0.12* 0.17*  CREATININE 1.45*  --   --   --   --   --   --  1.61*   TROPONINIHS 44*   < >  --  7,934* 7,467* 5,284*  --   --    < > = values in this interval not displayed.    Estimated Creatinine Clearance: 40.9 mL/min (A) (by C-G formula based on SCr of 1.61 mg/dL (H)).   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  Assessment: Paarth Macho is a 35 YOM has a PMH of COPD, T2DM, CKD, HLD, and HTN who presented with the ED for respiratory distress. EMS reports finding him hypoxic with sats in the 70's requiring BiPAP and nasal cannular of 4 L/min. Patient's troponin level trending up 44 > 2,272 and elevated BNP of 424 evident of an NSTEMI. CT Chest negative for a PE.   Hbg (11.4) and PLT (114) at baseline. Patient's aPTT (31) and PT (14.7) WNL.   12/27 20:06 HL 0.12, subtherapeutic 12/27 0612 HL 0.17, subtherapeutic  Goal of Therapy:  Heparin level 0.3-0.7 units/ml Monitor platelets by anticoagulation protocol: Yes   Plan:  -Heparin level is subtherapeutic -Heparin bolus of 2600 units  -Increase heparin infusion to 1600 units/hr -Recheck HL 8 hours after rate increase -CBC daily while on heparin  Otelia Sergeant, PharmD, [facility] 05/07/2023 6:55 AM

## 2023-05-07 NOTE — ED Notes (Signed)
This RN called the pt's wife per his request to ask that she bring him a stocking for his leg and to let her know that the dr would be rounding between 8-9 if she could be here by that time, she agreed and said she would bring his stocking and was about to leave the house now

## 2023-05-07 NOTE — ED Notes (Signed)
Echo at bedside with pt.

## 2023-05-07 NOTE — Progress Notes (Signed)
Progress Note   Patient: Christopher Pruitt [id number]:[id number] DOB: 03-23-1946 DOA: 05/06/2023     1 DOS: the patient was seen and examined on 05/07/2023   Brief hospital course: Taken from H&P.  Christopher Pruitt is a 77 y.o. male with medical history significant of COPD/interstitial lung disease, type 2 diabetes, stage III CKD, hypertension, hyperlipidemia presenting with acute respiratory failure with hypoxia, elevated BNP, pneumonia, elevated troponin.  Patient reports sudden onset of cough and shortness of breath earlier this morning.  No chest pain.  Has some?  Trace hemoptysis with coughing.  Mild wheezing.  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   Patient initially required BiPAP, able to transition to 2 L this morning.  BNP 425, troponin 44 >>2272-cardiology was consulted and he was started on heparin infusion.  CTA negative for PE but did show pulmonary edema.  Also started on IV diuresis and antibiotics until pneumonia ruled out.  12/27: Vital stable on room air now.  COVID-19, influenza and RSV negative, Troponin peaked at 7934, strep pneumo negative, procalcitonin 0.11 Hemoglobin decreased to 9.7 from 11.4 on admission without any obvious bleeding, slight worsening of creatinine to 1.61.  Doubt this is any upper respiratory infection or pneumonia, likely NSTEMI with pulmonary edema.  Echocardiogram with low normal EF and no regional wall motion abnormalities.  Discussed with cardiology but they are planning to do cardiac cath on Monday stating that it is not emergent.    Assessment and Plan: * Acute respiratory failure with hypoxia (HCC) Resolved, now back to room air.  Likely due to flash pulmonary edema with NSTEMI. 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 Procalcitonin at 0.11 likely no pneumonia. No wheezing. -Continue with supportive care  NSTEMI (non-ST elevated myocardial infarction) (HCC) Troponin peaked around 8000. EKG sinus tach w/ bigeminy  Cardiology is on board and patient was started on heparin infusion. No chest pain, echocardiogram with low normal EF and no regional wall motion abnormalities. Cardiology is planning to do cardiac cath on Monday. -Continue with heparin infusion  Elevated brain natriuretic peptide (BNP) level Decompensated respiratory failure initially requiring BiPAP and now back to room air with IV diuresis, noted elevated BNP of 424 and concern for pulmonary edema on imaging.  Concern of NSTEMI Echocardiogram with low normal EF with normal diastolic function, mildly elevated pulmonary pressure.  Pneumonia CT chest with concern of bilateral infiltrate with differential of pulmonary edema versus atypical pneumonia.  Procalcitonin at 0.11.  Patient was started on Rocephin and Zithromax. Doubt this is pneumonia as there is not much upper respiratory symptoms, afebrile and no leukocytosis. -Stopping antibiotics -Will recheck procalcitonin tomorrow  COPD (chronic obstructive pulmonary disease) (HCC) Patient with no wheezing.  CT chest concerning for underlying interstitial lung disease. -Continue with bronchodilator -Holding steroid for now  Hyperlipidemia, mixed Continue Crestor  DM type 2 with diabetic mixed hyperlipidemia (HCC) Seems well-controlled with A1c of 5.9. -Continue with SSI  CKD (chronic kidney disease) stage 3, GFR 30-59 ml/min (HCC) Slight worsening creatinine at 1.61, GFR in the 40s Appears near baseline Monitor renal function Avoid nephrotoxins   Subjective: Patient was seen and examined today.  No chest pain or shortness of breath.  Back on  room air.  Sudden onset of dyspnea which quickly improved with IV diuresis.  No prior episodes.  He was telling more stories to his history of prostate cancer and treatment.  Physical Exam: Vitals:   05/07/23 0730 05/07/23 0800 05/07/23 0906 05/07/23 1200  BP: 106/63   124/69  Pulse: 77 93  89  Resp: 18 13  13   Temp:   97.6 F (36.4 C)   TempSrc:   Oral   SpO2: 92% 100%  96%  Weight:      Height:       General.  Well-developed elderly man, in no acute distress. Pulmonary.  Lungs clear bilaterally, normal respiratory effort. CV.  Regular rate and rhythm, no JVD, rub or murmur. Abdomen.  Soft, nontender, nondistended, BS positive. CNS.  Alert and oriented .  No focal neurologic deficit. Extremities.  No edema, no cyanosis, pulses intact and symmetrical. Psychiatry.  Judgment and insight appears normal.   Data Reviewed: Prior data reviewed.  Family Communication: Discussed with wife at bedside  Disposition: Status is: Inpatient Remains inpatient appropriate because: Severity of illness  Planned Discharge Destination: Home  DVT prophylaxis.  Heparin infusion Time spent: 50 minutes  This record has been created using Conservation officer, historic buildings. Errors have been sought and corrected,but may not always be located. Such creation errors do not reflect on the standard of care.   Author: Arnetha Courser, MD 05/07/2023 2:01 PM  For on call review [email or web address].

## 2023-05-07 NOTE — Assessment & Plan Note (Signed)
Per patient he is taking some experimental drug by his urologist, Do not see anything in his record. -No acute concern -Continue with outpatient follow-up

## 2023-05-08 DIAGNOSIS — J9601 Acute respiratory failure with hypoxia: Secondary | ICD-10-CM | POA: Diagnosis not present

## 2023-05-08 DIAGNOSIS — I214 Non-ST elevation (NSTEMI) myocardial infarction: Secondary | ICD-10-CM

## 2023-05-08 DIAGNOSIS — R7989 Other specified abnormal findings of blood chemistry: Secondary | ICD-10-CM | POA: Diagnosis not present

## 2023-05-08 DIAGNOSIS — J449 Chronic obstructive pulmonary disease, unspecified: Secondary | ICD-10-CM | POA: Diagnosis not present

## 2023-05-08 LAB — TROPONIN I (HIGH SENSITIVITY): Troponin I (High Sensitivity): 1897 ng/L (ref <18)

## 2023-05-08 LAB — BASIC METABOLIC PANEL
Anion gap: 10 (ref 5–15)
BUN: 43 mg/dL — ABNORMAL HIGH (ref 8–23)
CO2: 24 mmol/L (ref 22–32)
Calcium: 8.7 mg/dL — ABNORMAL LOW (ref 8.9–10.3)
Chloride: 101 mmol/L (ref 98–111)
Creatinine, Ser: 1.54 mg/dL — ABNORMAL HIGH (ref 0.61–1.24)
GFR, Estimated: 46 mL/min — ABNORMAL LOW (ref >60)
Glucose, Bld: 133 mg/dL — ABNORMAL HIGH (ref 70–99)
Potassium: 3.9 mmol/L (ref 3.5–5.1)
Sodium: 135 mmol/L (ref 135–145)

## 2023-05-08 LAB — GLUCOSE, CAPILLARY
Glucose-Capillary: 118 mg/dL — ABNORMAL HIGH (ref 70–99)
Glucose-Capillary: 171 mg/dL — ABNORMAL HIGH (ref 70–99)

## 2023-05-08 LAB — CBC
HCT: 27.2 % — ABNORMAL LOW (ref 39.0–52.0)
Hemoglobin: 9.3 g/dL — ABNORMAL LOW (ref 13.0–17.0)
MCH: 30.1 pg (ref 26.0–34.0)
MCHC: 34.2 g/dL (ref 30.0–36.0)
MCV: 88 fL (ref 80.0–100.0)
Platelets: 112 10*3/uL — ABNORMAL LOW (ref 150–400)
RBC: 3.09 MIL/uL — ABNORMAL LOW (ref 4.22–5.81)
RDW: 16 % — ABNORMAL HIGH (ref 11.5–15.5)
WBC: 6.1 10*3/uL (ref 4.0–10.5)
nRBC: 0 % (ref 0.0–0.2)

## 2023-05-08 LAB — HEPARIN LEVEL (UNFRACTIONATED): Heparin Unfractionated: 0.45 [IU]/mL (ref 0.30–0.70)

## 2023-05-08 LAB — CBG MONITORING, ED
Glucose-Capillary: 111 mg/dL — ABNORMAL HIGH (ref 70–99)
Glucose-Capillary: 129 mg/dL — ABNORMAL HIGH (ref 70–99)

## 2023-05-08 LAB — PROCALCITONIN: Procalcitonin: <0.1 ng/mL

## 2023-05-08 NOTE — Plan of Care (Signed)
  Problem: Education: Goal: Ability to describe self-care measures that may prevent or decrease complications (Diabetes Survival Skills Education) will improve Outcome: Progressing   Problem: Coping: Goal: Ability to adjust to condition or change in health will improve Outcome: Progressing   Problem: Fluid Volume: Goal: Ability to maintain a balanced intake and output will improve Outcome: Progressing   Problem: Health Behavior/Discharge Planning: Goal: Ability to identify and utilize available resources and services will improve Outcome: Progressing   Problem: Metabolic: Goal: Ability to maintain appropriate glucose levels will improve Outcome: Progressing   Problem: Skin Integrity: Goal: Risk for impaired skin integrity will decrease Outcome: Progressing   Problem: Tissue Perfusion: Goal: Adequacy of tissue perfusion will improve Outcome: Progressing

## 2023-05-08 NOTE — Assessment & Plan Note (Signed)
Decompensated respiratory failure initially requiring BiPAP and now back to room air with IV diuresis, noted elevated BNP of 424 and concern for pulmonary edema on imaging.  Concern of NSTEMI Echocardiogram with low normal EF with normal diastolic function, mildly elevated pulmonary pressure. -Giving 1 more dose of IV Lasix today due to few basal crackles

## 2023-05-08 NOTE — Progress Notes (Addendum)
SUBJECTIVE: Patient is still short of breath but has no chest pain.  He feels much better compared to when he first came in.   Vitals:   05/08/23 0845 05/08/23 1000 05/08/23 1030 05/08/23 1230  BP:  121/67 122/73 134/72  Pulse:  79 74 94  Resp:  13 18 20   Temp: 97.6 F (36.4 C)   98.1 F (36.7 C)  TempSrc: Oral   Oral  SpO2:  95% 96% 100%  Weight:      Height:        Intake/Output Summary (Last 24 hours) at 05/08/2023 1308 Last data filed at 05/08/2023 1234 Gross per 24 hour  Intake 829.49 ml  Output --  Net 829.49 ml    LABS: Basic Metabolic Panel: Recent Labs    05/06/23 0634 05/07/23 0612 05/08/23 0442  NA 136 138 135  K 3.6 4.3 3.9  CL 103 103 101  CO2 23 22 24   GLUCOSE 158* 184* 133*  BUN 28* 34* 43*  CREATININE 1.45* 1.61* 1.54*  CALCIUM 8.8* 9.0 8.7*  MG 1.6*  --   --    Liver Function Tests: Recent Labs    05/06/23 0634 05/07/23 0612  AST 17 30  ALT 12 14  ALKPHOS 73 67  BILITOT 0.9 1.0  PROT 7.3 7.0  ALBUMIN 4.1 3.9   No results for input(s): "LIPASE", "AMYLASE" in the last 72 hours. CBC: Recent Labs    05/06/23 0634 05/07/23 0612 05/08/23 0442  WBC 4.1 4.7 6.1  NEUTROABS 2.5  --   --   HGB 11.4* 9.5* 9.3*  HCT 33.6* 27.8* 27.2*  MCV 88.2 86.3 88.0  PLT 114* 111* 112*   Cardiac Enzymes: No results for input(s): "CKTOTAL", "CKMB", "CKMBINDEX", "TROPONINI" in the last 72 hours. BNP: Invalid input(s): "POCBNP" D-Dimer: No results for input(s): "DDIMER" in the last 72 hours. Hemoglobin A1C: Recent Labs    05/06/23 1002  HGBA1C 5.9*   Fasting Lipid Panel: No results for input(s): "CHOL", "HDL", "LDLCALC", "TRIG", "CHOLHDL", "LDLDIRECT" in the last 72 hours. Thyroid Function Tests: No results for input(s): "TSH", "T4TOTAL", "T3FREE", "THYROIDAB" in the last 72 hours.  Invalid input(s): "FREET3" Anemia Panel: No results for input(s): "VITAMINB12", "FOLATE", "FERRITIN", "TIBC", "IRON", "RETICCTPCT" in the last 72  hours.   PHYSICAL EXAM General: Well developed, well nourished, in no acute distress HEENT:  Normocephalic and atramatic Neck:  No JVD.  Lungs: Clear bilaterally to auscultation and percussion. Heart: HRRR . Normal S1 and S2 without gallops or murmurs.  Abdomen: Bowel sounds are positive, abdomen soft and non-tender  Msk:  Back normal, normal gait. Normal strength and tone for age. Extremities: No clubbing, cyanosis or edema.   Neuro: Alert and oriented X 3. Psych:  Good affect, responds appropriately  TELEMETRY: Normal sinus rhythm  ASSESSMENT AND PLAN: #1 Non-STEMI with peak troponin 7900 yesterday presented with shortness of breath acute onset along with cough.  Patient denies any chest pain but is still short of breath although better than when he first arrived.  He is planned for left heart catheterization 730 Monday morning by Dr. Vivia Budge.  Discussed the case with the patient and explained the procedure to the patient and patient is in agreement to proceed with cardiac catheterization.  Advise continue heparin over the weekend. #2 patient has chronic kidney disease with creatinine 1.45, will watch the creatinine carefully and may have to give some fluids prior to cardiac catheterization  on Sunday night.   ICD-10-CM   1. Acute respiratory  failure with hypoxia (HCC)  J96.01       Principal Problem:   Acute respiratory failure with hypoxia (HCC) Active Problems:   Hypothyroidism   CKD (chronic kidney disease) stage 3, GFR 30-59 ml/min (HCC)   COPD (chronic obstructive pulmonary disease) (HCC)   DM type 2 with diabetic mixed hyperlipidemia (HCC)   Hyperlipidemia, mixed   Elevated brain natriuretic peptide (BNP) level   Pneumonia   NSTEMI (non-ST elevated myocardial infarction) (HCC)   Hypertension   History of prostate cancer    Adrian Blackwater, MD, [facility] 05/08/2023 1:08 PM

## 2023-05-08 NOTE — Progress Notes (Signed)
Progress Note   Patient: Christopher Pruitt [id number] DOB: 07-Jul-1945 DOA: 05/06/2023     2 DOS: the patient was seen and examined on 05/08/2023   Brief hospital course: Taken from H&P.  Christopher Pruitt is a 77 y.o. male with medical history significant of COPD/interstitial lung disease, type 2 diabetes, stage III CKD, hypertension, hyperlipidemia presenting with acute respiratory failure with hypoxia, elevated BNP, pneumonia, elevated troponin.  Patient reports sudden onset of cough and shortness of breath earlier this morning.  No chest pain.  Has some?  Trace hemoptysis with coughing.  Mild wheezing.  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   Patient initially required BiPAP, able to transition to 2 L this morning.  BNP 425, troponin 44 >>2272-cardiology was consulted and he was started on heparin infusion.  CTA negative for PE but did show pulmonary edema.  Also started on IV diuresis and antibiotics until pneumonia ruled out.  12/27: Vital stable on room air now.  COVID-19, influenza and RSV negative, Troponin peaked at 7934, strep pneumo negative, procalcitonin 0.11 Hemoglobin decreased to 9.7 from 11.4 on admission without any obvious bleeding, slight worsening of creatinine to 1.61.  Doubt this is any upper respiratory infection or pneumonia, likely NSTEMI with pulmonary edema.  Echocardiogram with low normal EF and no regional wall motion abnormalities.  Discussed with cardiology but they are planning to do cardiac cath on Monday stating that it is not emergent.  12/28: Vitals and labs mostly stable, small improvement in creatinine to 1.54, troponin continued to trend down after peaking at 7934.  Procalcitonin remain negative.  Patient remained on heparin infusion.  Cardiac cath on Monday.  Few basal crackles so giving 1 more dose of IV Lasix    Assessment and Plan: * Acute respiratory failure with hypoxia (HCC) Resolved, now back to  room air.  Likely due to flash pulmonary edema with NSTEMI. 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 Procalcitonin at 0.11>><0.10  no pneumonia. No wheezing. -Continue with supportive care  NSTEMI (non-ST elevated myocardial infarction) (HCC) Troponin peaked around 8000. EKG sinus tach w/ bigeminy  Cardiology is on board and patient was started on heparin infusion. No chest pain, echocardiogram with low normal EF and no regional wall motion abnormalities. Cardiology is planning to do cardiac cath on Monday. -Continue with heparin infusion  Elevated brain natriuretic peptide (BNP) level Decompensated respiratory failure initially requiring BiPAP and now back to room air with IV diuresis, noted elevated BNP of 424 and concern for pulmonary edema on imaging.  Concern of NSTEMI Echocardiogram with low normal EF with normal diastolic function, mildly elevated pulmonary pressure. -Giving 1 more dose of IV Lasix today due to few basal crackles  Pneumonia CT chest with concern of bilateral infiltrate with differential of pulmonary edema versus atypical pneumonia.  Procalcitonin at 0.11>><0.10.  Patient was started on Rocephin and Zithromax. Pneumonia ruled out as there is not much upper respiratory symptoms, afebrile and no leukocytosis. -Stopping antibiotics   COPD (chronic obstructive pulmonary disease) (HCC) Patient with no wheezing.  CT chest concerning for underlying interstitial lung disease. -Continue with bronchodilator -Holding steroid for now  Hypertension Blood pressure currently within goal. -Holding home lisinopril -Continue to monitor  CKD (chronic  kidney disease) stage 3, GFR 30-59 ml/min (HCC) Slight worsening creatinine at 1.61, GFR in the 40s Appears near baseline Monitor renal function Avoid nephrotoxins  DM type 2 with diabetic mixed hyperlipidemia (HCC) Seems well-controlled with A1c of 5.9. -Continue with SSI  Hypothyroidism -Continue home Synthroid  Hyperlipidemia, mixed Continue Crestor  History of prostate cancer Per patient he is taking some experimental drug by his urologist, Do not see anything in his record. -No acute concern -Continue with outpatient follow-up   Subjective: Patient denies any chest pain or shortness of breath.  Physical Exam: Vitals:   05/08/23 0845 05/08/23 1000 05/08/23 1030 05/08/23 1230  BP:  121/67 122/73 134/72  Pulse:  79 74 94  Resp:  13 18 20   Temp: 97.6 F (36.4 C)   98.1 F (36.7 C)  TempSrc: Oral   Oral  SpO2:  95% 96% 100%  Weight:      Height:       General.  Overweight elderly man, in no acute distress. Pulmonary.  Few basal crackles bilaterally, normal respiratory effort. CV.  Regular rate and rhythm, no JVD, rub or murmur. Abdomen.  Soft, nontender, nondistended, BS positive. CNS.  Alert and oriented .  No focal neurologic deficit. Extremities.  No edema, no cyanosis, pulses intact and symmetrical. Psychiatry.  Judgment and insight appears normal.    Data Reviewed: Prior data reviewed.  Family Communication: Discussed with wife at bedside  Disposition: Status is: Inpatient Remains inpatient appropriate because: Severity of illness  Planned Discharge Destination: Home  DVT prophylaxis.  Heparin infusion Time spent: 50 minutes  This record has been created using Conservation officer, historic buildings. Errors have been sought and corrected,but may not always be located. Such creation errors do not reflect on the standard of care.   Author: Arnetha Courser, MD 05/08/2023 3:01 PM  For on call review [email or web address].

## 2023-05-08 NOTE — Progress Notes (Signed)
   05/08/23 1500  Spiritual Encounters  Type of Visit Initial  Care provided to: Patient;Friend  Referral source Nurse (RN/NT/LPN)  Reason for visit Advance directives  OnCall Visit Yes  Spiritual Framework  Patient Stress Factors Major life changes  Interventions  Spiritual Care Interventions Made Established relationship of care and support;Compassionate presence;Reflective listening;Normalization of emotions  Intervention Outcomes  Outcomes Connection to spiritual care;Awareness of support  Spiritual Care Plan  Spiritual Care Issues Still Outstanding No further spiritual care needs at this time (see row info)   Talk with patient they talked about the spiritual revelation they had while being in the ED room. Patient is a spiritual man that spoke highly of God's plan for his life.  Patient was in good spirit and looking forward to what God has for them. Patient did not request AD it was place in the system by admission staff.

## 2023-05-08 NOTE — Plan of Care (Signed)
  Problem: Education: Goal: Ability to describe self-care measures that may prevent or decrease complications (Diabetes Survival Skills Education) will improve Outcome: Progressing Goal: Individualized Educational Video(s) Outcome: Progressing   Problem: Coping: Goal: Ability to adjust to condition or change in health will improve Outcome: Progressing   Problem: Fluid Volume: Goal: Ability to maintain a balanced intake and output will improve Outcome: Progressing   Problem: Health Behavior/Discharge Planning: Goal: Ability to identify and utilize available resources and services will improve Outcome: Progressing Goal: Ability to manage health-related needs will improve Outcome: Progressing   Problem: Metabolic: Goal: Ability to maintain appropriate glucose levels will improve Outcome: Progressing   Problem: Nutritional: Goal: Maintenance of adequate nutrition will improve Outcome: Progressing Goal: Progress toward achieving an optimal weight will improve Outcome: Progressing   Problem: Skin Integrity: Goal: Risk for impaired skin integrity will decrease Outcome: Progressing   Problem: Tissue Perfusion: Goal: Adequacy of tissue perfusion will improve Outcome: Progressing   Problem: Education: Goal: Knowledge of General Education information will improve Description: Including pain rating scale, medication(s)/side effects and non-pharmacologic comfort measures Outcome: Progressing   Problem: Health Behavior/Discharge Planning: Goal: Ability to manage health-related needs will improve Outcome: Progressing   Problem: Clinical Measurements: Goal: Ability to maintain clinical measurements within normal limits will improve Outcome: Progressing Goal: Will remain free from infection Outcome: Progressing Goal: Diagnostic test results will improve Outcome: Progressing Goal: Respiratory complications will improve Outcome: Progressing Goal: Cardiovascular complication will  be avoided Outcome: Progressing   Problem: Activity: Goal: Risk for activity intolerance will decrease Outcome: Progressing   Problem: Nutrition: Goal: Adequate nutrition will be maintained Outcome: Progressing   Problem: Coping: Goal: Level of anxiety will decrease Outcome: Progressing   Problem: Elimination: Goal: Will not experience complications related to bowel motility Outcome: Progressing Goal: Will not experience complications related to urinary retention Outcome: Progressing   Problem: Pain Management: Goal: General experience of comfort will improve Outcome: Progressing   Problem: Safety: Goal: Ability to remain free from injury will improve Outcome: Progressing   Problem: Skin Integrity: Goal: Risk for impaired skin integrity will decrease Outcome: Progressing   Problem: Education: Goal: Ability to demonstrate management of disease process will improve Outcome: Progressing Goal: Ability to verbalize understanding of medication therapies will improve Outcome: Progressing Goal: Individualized Educational Video(s) Outcome: Progressing   Problem: Activity: Goal: Capacity to carry out activities will improve Outcome: Progressing   Problem: Cardiac: Goal: Ability to achieve and maintain adequate cardiopulmonary perfusion will improve Outcome: Progressing   Problem: Education: Goal: Knowledge of disease or condition will improve Outcome: Progressing Goal: Knowledge of the prescribed therapeutic regimen will improve Outcome: Progressing Goal: Individualized Educational Video(s) Outcome: Progressing   Problem: Activity: Goal: Ability to tolerate increased activity will improve Outcome: Progressing Goal: Will verbalize the importance of balancing activity with adequate rest periods Outcome: Progressing   Problem: Respiratory: Goal: Ability to maintain a clear airway will improve Outcome: Progressing Goal: Levels of oxygenation will improve Outcome:  Progressing Goal: Ability to maintain adequate ventilation will improve Outcome: Progressing   Problem: Activity: Goal: Ability to tolerate increased activity will improve Outcome: Progressing   Problem: Clinical Measurements: Goal: Ability to maintain a body temperature in the normal range will improve Outcome: Progressing   Problem: Respiratory: Goal: Ability to maintain adequate ventilation will improve Outcome: Progressing Goal: Ability to maintain a clear airway will improve Outcome: Progressing

## 2023-05-08 NOTE — Assessment & Plan Note (Signed)
CT chest with concern of bilateral infiltrate with differential of pulmonary edema versus atypical pneumonia.  Procalcitonin at 0.11>><0.10.  Patient was started on Rocephin and Zithromax. Pneumonia ruled out as there is not much upper respiratory symptoms, afebrile and no leukocytosis. -Stopping antibiotics

## 2023-05-08 NOTE — Consult Note (Signed)
PHARMACY - ANTICOAGULATION CONSULT NOTE  Pharmacy Consult for Heparin Indication: NSTEMI  Allergies  Allergen Reactions   Amoxicillin-Pot Clavulanate Diarrhea and Nausea And Vomiting    Did it involve swelling of the face/tongue/throat, SOB, or low BP? No  Did it involve sudden or severe rash/hives, skin peeling, or any reaction on the inside of your mouth or nose? No  Did you need to seek medical attention at a hospital or doctor's office? No  When did it last happen?      last year or so  If all above answers are "NO", may proceed with cephalosporin use.  Did it involve swelling of the face/tongue/throat, SOB, or low BP? No  Did it involve sudden or severe rash/hives, skin peeling, or any reaction on the inside of your mouth or nose? No  Did you need to seek medical attention at a hospital or doctor's office? No  When did it last happen?      last year or so  If all above answers are "NO", may proceed with cephalosporin use.   Oxycodone Other (See Comments)    Confusion   Clindamycin/Lincomycin Itching   Nalfon [Fenoprofen Calcium] Hives   Robaxin [Methocarbamol] Hives    Patient Measurements: Height: 5\' 8"  (172.7 cm) Weight: 92 kg (202 lb 13.2 oz) IBW/kg (Calculated) : 68.4 Heparin Dosing Weight: 87.4 kg  Vital Signs: BP: 113/64 (12/28 0030) Pulse Rate: 70 (12/28 0030)  Labs: Recent Labs    05/06/23 0634 05/06/23 0831 05/06/23 1017 05/06/23 1519 05/06/23 1729 05/06/23 2000 05/06/23 2006 05/07/23 0612 05/07/23 1633 05/08/23 0115  HGB 11.4*  --   --   --   --   --   --  9.5*  --   --   HCT 33.6*  --   --   --   --   --   --  27.8*  --   --   PLT 114*  --   --   --   --   --   --  111*  --   --   APTT  --   --  31  --   --   --   --   --   --   --   LABPROT  --   --  14.7  --   --   --   --   --   --   --   INR  --   --  1.1  --   --   --   --   --   --   --   HEPARINUNFRC  --   --   --   --   --   --    < > 0.17* 0.43 0.45  CREATININE 1.45*  --   --   --    --   --   --  1.61*  --   --   TROPONINIHS 44*   < >  --  7,934* 3,664* 4,034*  --   --   --   --    < > = values in this interval not displayed.    Estimated Creatinine Clearance: 40.9 mL/min (A) (by C-G formula based on SCr of 1.61 mg/dL (H)).   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  Assessment: Christopher Herandez is a 46 YOM has a PMH of COPD, T2DM, CKD, HLD, and HTN who presented with the ED for respiratory distress. EMS reports finding him hypoxic with sats in the 70's requiring BiPAP and nasal cannular of 4 L/min. Patient's troponin level trending up 44 > 2,272 and elevated BNP of 424 evident of an NSTEMI. CT Chest negative for a PE.   Hbg (11.4) and PLT (114) at baseline. Patient's aPTT (31) and PT (14.7) WNL.   12/27 20:06 HL 0.12, subtherapeutic 12/27 0612 HL 0.17, subtherapeutic 12/27 1633 HL 0.43, therapeutic x 1 12/28 0115 HL 0.45, therapeutic x 2  Goal of Therapy:  Heparin level 0.3-0.7 units/ml Monitor platelets by anticoagulation protocol: Yes   Plan:  -Heparin level is therapeutic -Continue heparin infusion at a rate of 1600 units/hr -Recheck HL daily w/ AM labs while therapeutic -CBC daily while on heparin  Otelia Sergeant, PharmD, [facility] 05/08/2023 1:56 AM

## 2023-05-08 NOTE — Assessment & Plan Note (Signed)
Resolved, now back to room air.  Likely due to flash pulmonary edema with NSTEMI. 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 Procalcitonin at 0.11>><0.10  no pneumonia. No wheezing. -Continue with supportive care

## 2023-05-08 NOTE — Assessment & Plan Note (Signed)
Troponin peaked around 8000. EKG sinus tach w/ bigeminy  Cardiology is on board and patient was started on heparin infusion. No chest pain, echocardiogram with low normal EF and no regional wall motion abnormalities. Cardiology is planning to do cardiac cath on Monday. -Continue with heparin infusion

## 2023-05-09 DIAGNOSIS — J9601 Acute respiratory failure with hypoxia: Secondary | ICD-10-CM | POA: Diagnosis not present

## 2023-05-09 DIAGNOSIS — R7989 Other specified abnormal findings of blood chemistry: Secondary | ICD-10-CM | POA: Diagnosis not present

## 2023-05-09 DIAGNOSIS — I214 Non-ST elevation (NSTEMI) myocardial infarction: Secondary | ICD-10-CM | POA: Diagnosis not present

## 2023-05-09 DIAGNOSIS — J449 Chronic obstructive pulmonary disease, unspecified: Secondary | ICD-10-CM | POA: Diagnosis not present

## 2023-05-09 LAB — CBC
HCT: 28.8 % — ABNORMAL LOW (ref 39.0–52.0)
Hemoglobin: 10 g/dL — ABNORMAL LOW (ref 13.0–17.0)
MCH: 29.4 pg (ref 26.0–34.0)
MCHC: 34.7 g/dL (ref 30.0–36.0)
MCV: 84.7 fL (ref 80.0–100.0)
Platelets: 106 10*3/uL — ABNORMAL LOW (ref 150–400)
RBC: 3.4 MIL/uL — ABNORMAL LOW (ref 4.22–5.81)
RDW: 15.9 % — ABNORMAL HIGH (ref 11.5–15.5)
WBC: 5.4 10*3/uL (ref 4.0–10.5)
nRBC: 1.1 % — ABNORMAL HIGH (ref 0.0–0.2)

## 2023-05-09 LAB — HEPARIN LEVEL (UNFRACTIONATED): Heparin Unfractionated: 0.4 [IU]/mL (ref 0.30–0.70)

## 2023-05-09 LAB — GLUCOSE, CAPILLARY
Glucose-Capillary: 115 mg/dL — ABNORMAL HIGH (ref 70–99)
Glucose-Capillary: 187 mg/dL — ABNORMAL HIGH (ref 70–99)
Glucose-Capillary: 116 mg/dL — ABNORMAL HIGH (ref 70–99)
Glucose-Capillary: 128 mg/dL — ABNORMAL HIGH (ref 70–99)

## 2023-05-09 LAB — BASIC METABOLIC PANEL
Anion gap: 9 (ref 5–15)
BUN: 32 mg/dL — ABNORMAL HIGH (ref 8–23)
CO2: 25 mmol/L (ref 22–32)
Calcium: 8.7 mg/dL — ABNORMAL LOW (ref 8.9–10.3)
Chloride: 102 mmol/L (ref 98–111)
Creatinine, Ser: 1.33 mg/dL — ABNORMAL HIGH (ref 0.61–1.24)
GFR, Estimated: 54 mL/min — ABNORMAL LOW (ref >60)
Glucose, Bld: 109 mg/dL — ABNORMAL HIGH (ref 70–99)
Potassium: 4 mmol/L (ref 3.5–5.1)
Sodium: 136 mmol/L (ref 135–145)

## 2023-05-09 MED ORDER — SODIUM CHLORIDE 0.9 % WEIGHT BASED INFUSION
3.0000 mL/kg/h | INTRAVENOUS | Status: AC
Start: 2023-05-10 — End: 2023-05-10
  Administered 2023-05-10: 3 mL/kg/h via INTRAVENOUS

## 2023-05-09 MED ORDER — SODIUM CHLORIDE 0.9% FLUSH
3.0000 mL | Freq: Two times a day (BID) | INTRAVENOUS | Status: DC
  Administered 2023-05-09: 3 mL via INTRAVENOUS

## 2023-05-09 MED ORDER — SODIUM CHLORIDE 0.9 % IV SOLN: 250.0000 mL | INTRAVENOUS | Status: DC | PRN

## 2023-05-09 MED ORDER — ASPIRIN 81 MG PO CHEW
81.0000 mg | CHEWABLE_TABLET | ORAL | Status: AC
Start: 2023-05-10 — End: 2023-05-11
  Administered 2023-05-10: 81 mg via ORAL
  Filled 2023-05-09: qty 1

## 2023-05-09 MED ORDER — SODIUM CHLORIDE 0.9 % WEIGHT BASED INFUSION
1.0000 mL/kg/h | INTRAVENOUS | Status: DC
  Administered 2023-05-10: 1 mL/kg/h via INTRAVENOUS

## 2023-05-09 MED ORDER — SODIUM CHLORIDE 0.9% FLUSH: 3.0000 mL | INTRAVENOUS | Status: DC | PRN

## 2023-05-09 NOTE — Progress Notes (Signed)
Progress Note   Patient: Christopher Pruitt BJY:[id number] DOB: 1945-07-17 DOA: 05/06/2023     3 DOS: the patient was seen and examined on 05/09/2023   Brief hospital course: Taken from H&P.  JAYLN HULTON is a 77 y.o. male with medical history significant of COPD/interstitial lung disease, type 2 diabetes, stage III CKD, hypertension, hyperlipidemia presenting with acute respiratory failure with hypoxia, elevated BNP, pneumonia, elevated troponin.  Patient reports sudden onset of cough and shortness of breath earlier this morning.  No chest pain.  Has some?  Trace hemoptysis with coughing.  Mild wheezing.  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   Patient initially required BiPAP, able to transition to 2 L this morning.  BNP 425, troponin 44 >>2272-cardiology was consulted and he was started on heparin infusion.  CTA negative for PE but did show pulmonary edema.  Also started on IV diuresis and antibiotics until pneumonia ruled out.  12/27: Vital stable on room air now.  COVID-19, influenza and RSV negative, Troponin peaked at 7934, strep pneumo negative, procalcitonin 0.11 Hemoglobin decreased to 9.7 from 11.4 on admission without any obvious bleeding, slight worsening of creatinine to 1.61.  Doubt this is any upper respiratory infection or pneumonia, likely NSTEMI with pulmonary edema.  Echocardiogram with low normal EF and no regional wall motion abnormalities.  Discussed with cardiology but they are planning to do cardiac cath on Monday stating that it is not emergent.  12/28: Vitals and labs mostly stable, small improvement in creatinine to 1.54, troponin continued to trend down after peaking at 7934.  Procalcitonin remain negative.  Patient remained on heparin infusion.  Cardiac cath on Monday.  Few basal crackles so giving 1 more dose of IV Lasix  12/29: Remained hemodynamically stable, cardiac cath tomorrow.   Assessment and Plan: *  Acute respiratory failure with hypoxia (HCC) Resolved, now back to room air.  Likely due to flash pulmonary edema with NSTEMI. 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 Procalcitonin at 0.11>><0.10  no pneumonia. No wheezing. -Continue with supportive care  NSTEMI (non-ST elevated myocardial infarction) (HCC) Troponin peaked around 8000. EKG sinus tach w/ bigeminy  Cardiology is on board and patient was started on heparin infusion. No chest pain, echocardiogram with low normal EF and no regional wall motion abnormalities. Cardiology is planning to do cardiac cath on Monday. -Continue with heparin infusion  Elevated brain natriuretic peptide (BNP) level Decompensated respiratory failure initially requiring BiPAP and now back to room air with IV diuresis, noted elevated BNP of 424 and concern for pulmonary edema on imaging.  Concern of NSTEMI Echocardiogram with low normal EF with normal diastolic function, mildly elevated pulmonary pressure. -Giving 1 more dose of IV Lasix today due to few basal crackles  Pneumonia CT chest with concern of bilateral infiltrate with differential of pulmonary edema versus atypical pneumonia.  Procalcitonin at 0.11>><0.10.  Patient was started on Rocephin and Zithromax. Pneumonia ruled out as there is not much upper respiratory symptoms, afebrile and no leukocytosis. -Stopping antibiotics   COPD (chronic obstructive pulmonary disease) (HCC) Patient with no wheezing.  CT chest concerning for underlying interstitial lung disease. -Continue with bronchodilator -Holding steroid for now  Hypertension Blood pressure currently within  goal. -Holding home lisinopril -Continue to monitor  CKD (chronic kidney disease) stage 3, GFR 30-59 ml/min (HCC) Slight worsening creatinine at 1.61, GFR in the 40s Appears near baseline Monitor renal function Avoid nephrotoxins  DM type 2 with diabetic mixed hyperlipidemia (HCC) Seems well-controlled with A1c of 5.9. -Continue with SSI  Hypothyroidism -Continue home Synthroid  Hyperlipidemia, mixed Continue Crestor  History of prostate cancer Per patient he is taking some experimental drug by his urologist, Do not see anything in his record. -No acute concern -Continue with outpatient follow-up   Subjective: Patient denies any chest pain or shortness of breath.  Physical Exam: Vitals:   05/09/23 0448 05/09/23 0546 05/09/23 0910 05/09/23 1319  BP: 118/60  (!) 149/83 136/63  Pulse: 79  82 82  Resp: 17  16 16   Temp: 98.8 F (37.1 C)  98.4 F (36.9 C) 98 F (36.7 C)  TempSrc: Axillary  Oral   SpO2: 95% 98% 98% 96%  Weight:      Height:       General.  Well-developed elderly man, in no acute distress. Pulmonary.  Lungs clear bilaterally, normal respiratory effort. CV.  Regular rate and rhythm, no JVD, rub or murmur. Abdomen.  Soft, nontender, nondistended, BS positive. CNS.  Alert and oriented .  No focal neurologic deficit. Extremities.  No edema, no cyanosis, pulses intact and symmetrical. Psychiatry.  Judgment and insight appears normal.     Data Reviewed: Prior data reviewed.  Family Communication: Discussed with wife at bedside  Disposition: Status is: Inpatient Remains inpatient appropriate because: Severity of illness  Planned Discharge Destination: Home  DVT prophylaxis.  Heparin infusion Time spent: 45 minutes  This record has been created using Conservation officer, historic buildings. Errors have been sought and corrected,but may not always be located. Such creation errors do not reflect on the standard of care.   Author: Arnetha Courser,  MD 05/09/2023 2:51 PM  For on call review [email or web address].

## 2023-05-09 NOTE — Consult Note (Signed)
PHARMACY - ANTICOAGULATION CONSULT NOTE  Pharmacy Consult for Heparin Indication: NSTEMI  Allergies  Allergen Reactions   Amoxicillin-Pot Clavulanate Diarrhea and Nausea And Vomiting    Did it involve swelling of the face/tongue/throat, SOB, or low BP? No  Did it involve sudden or severe rash/hives, skin peeling, or any reaction on the inside of your mouth or nose? No  Did you need to seek medical attention at a hospital or doctor's office? No  When did it last happen?      last year or so  If all above answers are "NO", may proceed with cephalosporin use.  Did it involve swelling of the face/tongue/throat, SOB, or low BP? No  Did it involve sudden or severe rash/hives, skin peeling, or any reaction on the inside of your mouth or nose? No  Did you need to seek medical attention at a hospital or doctor's office? No  When did it last happen?      last year or so  If all above answers are "NO", may proceed with cephalosporin use.   Oxycodone Other (See Comments)    Confusion   Clindamycin/Lincomycin Itching   Nalfon [Fenoprofen Calcium] Hives   Robaxin [Methocarbamol] Hives    Patient Measurements: Height: 5\' 8"  (172.7 cm) Weight: 92 kg (202 lb 13.2 oz) IBW/kg (Calculated) : 68.4 Heparin Dosing Weight: 87.4 kg  Vital Signs: Temp: 98.8 F (37.1 C) (12/29 0448) Temp Source: Axillary (12/29 0448) BP: 118/60 (12/29 0448) Pulse Rate: 79 (12/29 0448)  Labs: Recent Labs    05/06/23 1017 05/06/23 1519 05/06/23 1729 05/06/23 2000 05/06/23 2006 05/07/23 0612 05/07/23 1633 05/08/23 0115 05/08/23 0442 05/09/23 0451  HGB  --   --   --   --    < > 9.5*  --   --  9.3* 10.0*  HCT  --   --   --   --   --  27.8*  --   --  27.2* 28.8*  PLT  --   --   --   --   --  111*  --   --  112* 106*  APTT 31  --   --   --   --   --   --   --   --   --   LABPROT 14.7  --   --   --   --   --   --   --   --   --   INR 1.1  --   --   --   --   --   --   --   --   --   HEPARINUNFRC  --   --    --   --    < > 0.17* 0.43 0.45  --  0.40  CREATININE  --   --   --   --   --  1.61*  --   --  1.54* 1.33*  TROPONINIHS  --    < > 7,467* 8,413*  --   --   --   --  1,897*  --    < > = values in this interval not displayed.    Estimated Creatinine Clearance: 49.6 mL/min (A) (by C-G formula based on SCr of 1.33 mg/dL (H)).   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  Assessment: Christopher Pruitt is a 80 YOM has a PMH of COPD, T2DM, CKD, HLD, and HTN who presented with the ED for respiratory distress. EMS reports finding him hypoxic with sats in the 70's requiring BiPAP and nasal cannular of 4 L/min. Patient's troponin level trending up 44 > 2,272 and elevated BNP of 424 evident of an NSTEMI. CT Chest negative for a PE.   Hbg (11.4) and PLT (114) at baseline. Patient's aPTT (31) and PT (14.7) WNL.   12/27 20:06 HL 0.12, subtherapeutic 12/27 0612 HL 0.17, subtherapeutic 12/27 1633 HL 0.43, therapeutic x 1 12/28 0115 HL 0.45, therapeutic x 2 12/29 0451 HL 0.40, therapeutic x 3  Goal of Therapy:  Heparin level 0.3-0.7 units/ml Monitor platelets by anticoagulation protocol: Yes   Plan:  -Heparin level is therapeutic -Continue heparin infusion at a rate of 1600 units/hr -Recheck HL daily w/ AM labs while therapeutic -CBC daily while on heparin  Otelia Sergeant, PharmD, [facility] 05/09/2023 7:12 AM

## 2023-05-09 NOTE — Progress Notes (Signed)
SUBJECTIVE: Patient is feeling much better denies any chest pain or shortness of breath Vitals:   05/09/23 0003 05/09/23 0448 05/09/23 0546 05/09/23 0910  BP: 122/61 118/60  (!) 149/83  Pulse: 70 79  82  Resp: 16 17  16   Temp: 98.1 F (36.7 C) 98.8 F (37.1 C)  98.4 F (36.9 C)  TempSrc: Oral Axillary  Oral  SpO2: 98% 95% 98% 98%  Weight:      Height:        Intake/Output Summary (Last 24 hours) at 05/09/2023 1306 Last data filed at 05/09/2023 0431 Gross per 24 hour  Intake 170.91 ml  Output 675 ml  Net -504.09 ml    LABS: Basic Metabolic Panel: Recent Labs    05/08/23 0442 05/09/23 0451  NA 135 136  K 3.9 4.0  CL 101 102  CO2 24 25  GLUCOSE 133* 109*  BUN 43* 32*  CREATININE 1.54* 1.33*  CALCIUM 8.7* 8.7*   Liver Function Tests: Recent Labs    05/07/23 0612  AST 30  ALT 14  ALKPHOS 67  BILITOT 1.0  PROT 7.0  ALBUMIN 3.9   No results for input(s): "LIPASE", "AMYLASE" in the last 72 hours. CBC: Recent Labs    05/08/23 0442 05/09/23 0451  WBC 6.1 5.4  HGB 9.3* 10.0*  HCT 27.2* 28.8*  MCV 88.0 84.7  PLT 112* 106*   Cardiac Enzymes: No results for input(s): "CKTOTAL", "CKMB", "CKMBINDEX", "TROPONINI" in the last 72 hours. BNP: Invalid input(s): "POCBNP" D-Dimer: No results for input(s): "DDIMER" in the last 72 hours. Hemoglobin A1C: No results for input(s): "HGBA1C" in the last 72 hours. Fasting Lipid Panel: No results for input(s): "CHOL", "HDL", "LDLCALC", "TRIG", "CHOLHDL", "LDLDIRECT" in the last 72 hours. Thyroid Function Tests: No results for input(s): "TSH", "T4TOTAL", "T3FREE", "THYROIDAB" in the last 72 hours.  Invalid input(s): "FREET3" Anemia Panel: No results for input(s): "VITAMINB12", "FOLATE", "FERRITIN", "TIBC", "IRON", "RETICCTPCT" in the last 72 hours.   PHYSICAL EXAM General: Well developed, well nourished, in no acute distress HEENT:  Normocephalic and atramatic Neck:  No JVD.  Lungs: Clear bilaterally to auscultation  and percussion. Heart: HRRR . Normal S1 and S2 without gallops or murmurs.  Abdomen: Bowel sounds are positive, abdomen soft and non-tender  Msk:  Back normal, normal gait. Normal strength and tone for age. Extremities: No clubbing, cyanosis or edema.   Neuro: Alert and oriented X 3. Psych:  Good affect, responds appropriately  TELEMETRY: Sinus rhythm  ASSESSMENT AND PLAN: : #1 Non-STEMI with peak troponin 7900 yesterday presented with shortness of breath acute onset along with cough.  Patient denies any chest pain but is still short of breath although better than when he first arrived.  He is planned for left heart catheterization 730 Monday morning by Dr. Vivia Budge.  Discussed the case with the patient and explained the procedure to the patient and patient is in agreement to proceed with cardiac catheterization.  Advise continue heparin over the weekend. #2 patient has chronic kidney disease with creatinine 1.54, will watch the creatinine carefully and will give fluids prior to cardiac catheterization.     ICD-10-CM   1. Acute respiratory failure with hypoxia (HCC)  J96.01       Principal Problem:   Acute respiratory failure with hypoxia (HCC) Active Problems:   Hypothyroidism   CKD (chronic kidney disease) stage 3, GFR 30-59 ml/min (HCC)   COPD (chronic obstructive pulmonary disease) (HCC)   DM type 2 with diabetic mixed hyperlipidemia (HCC)  Hyperlipidemia, mixed   Elevated brain natriuretic peptide (BNP) level   Pneumonia   NSTEMI (non-ST elevated myocardial infarction) (HCC)   Hypertension   History of prostate cancer    Christopher Blackwater, MD, [facility] 05/09/2023 1:06 PM

## 2023-05-10 ENCOUNTER — Encounter: Payer: Self-pay | Admitting: Cardiology

## 2023-05-10 ENCOUNTER — Encounter
Admission: EM | Disposition: A | Discharge status: Home / Self Care | Payer: Self-pay | Source: Home / Self Care | Attending: Internal Medicine

## 2023-05-10 DIAGNOSIS — R7989 Other specified abnormal findings of blood chemistry: Secondary | ICD-10-CM | POA: Diagnosis not present

## 2023-05-10 DIAGNOSIS — I214 Non-ST elevation (NSTEMI) myocardial infarction: Secondary | ICD-10-CM | POA: Diagnosis not present

## 2023-05-10 DIAGNOSIS — J449 Chronic obstructive pulmonary disease, unspecified: Secondary | ICD-10-CM | POA: Diagnosis not present

## 2023-05-10 DIAGNOSIS — J9601 Acute respiratory failure with hypoxia: Secondary | ICD-10-CM | POA: Diagnosis not present

## 2023-05-10 HISTORY — PX: LEFT HEART CATH AND CORONARY ANGIOGRAPHY: CATH118249

## 2023-05-10 LAB — CBC
HCT: 28.7 % — ABNORMAL LOW (ref 39.0–52.0)
Hemoglobin: 9.9 g/dL — ABNORMAL LOW (ref 13.0–17.0)
MCH: 29.9 pg (ref 26.0–34.0)
MCHC: 34.5 g/dL (ref 30.0–36.0)
MCV: 86.7 fL (ref 80.0–100.0)
Platelets: 107 10*3/uL — ABNORMAL LOW (ref 150–400)
RBC: 3.31 MIL/uL — ABNORMAL LOW (ref 4.22–5.81)
RDW: 15.8 % — ABNORMAL HIGH (ref 11.5–15.5)
WBC: 4.6 10*3/uL (ref 4.0–10.5)
nRBC: 0 % (ref 0.0–0.2)

## 2023-05-10 LAB — HEPARIN LEVEL (UNFRACTIONATED): Heparin Unfractionated: 0.29 [IU]/mL — ABNORMAL LOW (ref 0.30–0.70)

## 2023-05-10 LAB — GLUCOSE, CAPILLARY: Glucose-Capillary: 122 mg/dL — ABNORMAL HIGH (ref 70–99)

## 2023-05-10 SURGERY — LEFT HEART CATH AND CORONARY ANGIOGRAPHY
Anesthesia: Moderate Sedation

## 2023-05-10 MED ORDER — LIDOCAINE HCL 1 % IJ SOLN
INTRAMUSCULAR | Status: AC
  Filled 2023-05-10: qty 20

## 2023-05-10 MED ORDER — LISINOPRIL 10 MG PO TABS
10.0000 mg | ORAL_TABLET | Freq: Every day | ORAL | Status: DC
  Administered 2023-05-10: 10 mg via ORAL
  Filled 2023-05-10: qty 1

## 2023-05-10 MED ORDER — FUROSEMIDE 20 MG PO TABS: 20.0000 mg | ORAL_TABLET | Freq: Every day | ORAL | 1 refills | Status: DC

## 2023-05-10 MED ORDER — FENTANYL CITRATE (PF) 100 MCG/2ML IJ SOLN
INTRAMUSCULAR | Status: AC
  Filled 2023-05-10: qty 2

## 2023-05-10 MED ORDER — MIDAZOLAM HCL 2 MG/2ML IJ SOLN
INTRAMUSCULAR | Status: AC
  Filled 2023-05-10: qty 2

## 2023-05-10 MED ORDER — IPRATROPIUM-ALBUTEROL 0.5-2.5 (3) MG/3ML IN SOLN: 3.0000 mL | Freq: Four times a day (QID) | RESPIRATORY_TRACT | 1 refills | Status: DC | PRN

## 2023-05-10 MED ORDER — METOPROLOL SUCCINATE ER 25 MG PO TB24: 25.0000 mg | ORAL_TABLET | Freq: Every day | ORAL | 2 refills | Status: DC

## 2023-05-10 MED ORDER — MIDAZOLAM HCL 2 MG/2ML IJ SOLN
INTRAMUSCULAR | Status: DC | PRN
  Administered 2023-05-10: 1 mg via INTRAVENOUS

## 2023-05-10 MED ORDER — HEPARIN SODIUM (PORCINE) 1000 UNIT/ML IJ SOLN
INTRAMUSCULAR | Status: AC
  Filled 2023-05-10: qty 10

## 2023-05-10 MED ORDER — HEPARIN (PORCINE) IN NACL 1000-0.9 UT/500ML-% IV SOLN
INTRAVENOUS | Status: DC | PRN
  Administered 2023-05-10 (×2): 500 mL

## 2023-05-10 MED ORDER — LISINOPRIL 10 MG PO TABS: 10.0000 mg | ORAL_TABLET | Freq: Every day | ORAL | 2 refills | Status: DC

## 2023-05-10 MED ORDER — HEPARIN SODIUM (PORCINE) 1000 UNIT/ML IJ SOLN
INTRAMUSCULAR | Status: DC | PRN
  Administered 2023-05-10: 4000 [IU] via INTRAVENOUS

## 2023-05-10 MED ORDER — LIDOCAINE HCL (PF) 1 % IJ SOLN
INTRAMUSCULAR | Status: DC | PRN
  Administered 2023-05-10: 2 mL

## 2023-05-10 MED ORDER — FUROSEMIDE 20 MG PO TABS
20.0000 mg | ORAL_TABLET | Freq: Every day | ORAL | Status: DC
  Administered 2023-05-10: 20 mg via ORAL
  Filled 2023-05-10: qty 1

## 2023-05-10 MED ORDER — ALBUTEROL SULFATE HFA 108 (90 BASE) MCG/ACT IN AERS: 2.0000 | INHALATION_SPRAY | Freq: Four times a day (QID) | RESPIRATORY_TRACT | 2 refills | Status: DC | PRN

## 2023-05-10 MED ORDER — ASPIRIN 81 MG PO TBEC: 81.0000 mg | DELAYED_RELEASE_TABLET | Freq: Every day | ORAL | 1 refills | Status: DC

## 2023-05-10 MED ORDER — ASPIRIN 81 MG PO TBEC: 81.0000 mg | DELAYED_RELEASE_TABLET | Freq: Every day | ORAL | 1 refills | Status: AC

## 2023-05-10 MED ORDER — VERAPAMIL HCL 2.5 MG/ML IV SOLN
INTRAVENOUS | Status: AC
  Filled 2023-05-10: qty 2

## 2023-05-10 MED ORDER — IOHEXOL 300 MG/ML  SOLN
INTRAMUSCULAR | Status: DC | PRN
  Administered 2023-05-10: 80 mL

## 2023-05-10 MED ORDER — FENTANYL CITRATE (PF) 100 MCG/2ML IJ SOLN
INTRAMUSCULAR | Status: DC | PRN
  Administered 2023-05-10: 25 ug via INTRAVENOUS

## 2023-05-10 MED ORDER — VERAPAMIL HCL 2.5 MG/ML IV SOLN
INTRAVENOUS | Status: DC | PRN
  Administered 2023-05-10: 2.5 mg via INTRA_ARTERIAL

## 2023-05-10 MED ORDER — METOPROLOL SUCCINATE ER 25 MG PO TB24
25.0000 mg | ORAL_TABLET | Freq: Every day | ORAL | Status: DC
  Administered 2023-05-10: 25 mg via ORAL
  Filled 2023-05-10: qty 1

## 2023-05-10 MED ORDER — ALBUTEROL SULFATE HFA 108 (90 BASE) MCG/ACT IN AERS
2.0000 | INHALATION_SPRAY | Freq: Four times a day (QID) | RESPIRATORY_TRACT | 2 refills | Status: DC | PRN
Start: 2023-05-10 — End: 2023-05-10

## 2023-05-10 MED ORDER — HEPARIN (PORCINE) IN NACL 1000-0.9 UT/500ML-% IV SOLN
INTRAVENOUS | Status: AC
  Filled 2023-05-10: qty 1000

## 2023-05-10 MED ORDER — ASPIRIN 81 MG PO TBEC: 81.0000 mg | DELAYED_RELEASE_TABLET | Freq: Every day | ORAL | Status: DC

## 2023-05-10 SURGICAL SUPPLY — 11 items
CATH 5FR JL3.5 JR4 ANG PIG MP (CATHETERS) IMPLANT
DEVICE RAD TR BAND REGULAR (VASCULAR PRODUCTS) IMPLANT
DRAPE BRACHIAL (DRAPES) IMPLANT
GLIDESHEATH SLEND SS 6F .021 (SHEATH) IMPLANT
GUIDEWIRE INQWIRE 1.5J.035X260 (WIRE) IMPLANT
INQWIRE 1.5J .035X260CM (WIRE) ×1
KIT SYRINGE INJ CVI SPIKEX1 (MISCELLANEOUS) IMPLANT
PACK CARDIAC CATH (CUSTOM PROCEDURE TRAY) ×1 IMPLANT
PROTECTION STATION PRESSURIZED (MISCELLANEOUS) ×1
SET ATX-X65L (MISCELLANEOUS) IMPLANT
STATION PROTECTION PRESSURIZED (MISCELLANEOUS) IMPLANT

## 2023-05-10 NOTE — TOC Transition Note (Signed)
Transition of Care [facility]) - Progression Note    Patient Details  Name: Christopher Pruitt MRN: [id number] Date of Birth: 10-26-1945  Transition of Care [facility]) CM/SW Contact  Truddie Hidden, RN Phone Number: 05/10/2023, 12:06 PM  Clinical Narrative:    Spoke with patient and his wife regarding discharge today. Patient advised his nebulizer will be delivered via to his home or to the room. Patient wife will transport him home.   Referral made to [employer] for nebulizer.   TOC signing off.          Expected Discharge Plan and Services         Expected Discharge Date: 05/10/23                                     Social Determinants of Health (SDOH) Interventions SDOH Screenings   Food Insecurity: No Food Insecurity (05/08/2023)  Housing: Low Risk  (05/08/2023)  Transportation Needs: No Transportation Needs (05/08/2023)  Utilities: Not At Risk (05/08/2023)  Financial Resource Strain: Low Risk  (03/26/2023)   Received from [facility]  Tobacco Use: Medium Risk (05/06/2023)    Readmission Risk Interventions     No data to display

## 2023-05-10 NOTE — Plan of Care (Signed)
Problem: Education: Goal: Ability to describe self-care measures that may prevent or decrease complications (Diabetes Survival Skills Education) will improve Outcome: Progressing Goal: Individualized Educational Video(s) Outcome: Progressing   Problem: Coping: Goal: Ability to adjust to condition or change in health will improve Outcome: Progressing   Problem: Fluid Volume: Goal: Ability to maintain a balanced intake and output will improve Outcome: Progressing   Problem: Health Behavior/Discharge Planning: Goal: Ability to identify and utilize available resources and services will improve Outcome: Progressing Goal: Ability to manage health-related needs will improve Outcome: Progressing   Problem: Metabolic: Goal: Ability to maintain appropriate glucose levels will improve Outcome: Progressing   Problem: Nutritional: Goal: Maintenance of adequate nutrition will improve Outcome: Progressing Goal: Progress toward achieving an optimal weight will improve Outcome: Progressing   Problem: Skin Integrity: Goal: Risk for impaired skin integrity will decrease Outcome: Progressing   Problem: Tissue Perfusion: Goal: Adequacy of tissue perfusion will improve Outcome: Progressing   Problem: Education: Goal: Knowledge of General Education information will improve Description: Including pain rating scale, medication(s)/side effects and non-pharmacologic comfort measures Outcome: Progressing   Problem: Health Behavior/Discharge Planning: Goal: Ability to manage health-related needs will improve Outcome: Progressing   Problem: Clinical Measurements: Goal: Ability to maintain clinical measurements within normal limits will improve Outcome: Progressing Goal: Will remain free from infection Outcome: Progressing Goal: Diagnostic test results will improve Outcome: Progressing Goal: Respiratory complications will improve Outcome: Progressing Goal: Cardiovascular complication will  be avoided Outcome: Progressing   Problem: Activity: Goal: Risk for activity intolerance will decrease Outcome: Progressing   Problem: Nutrition: Goal: Adequate nutrition will be maintained Outcome: Progressing   Problem: Coping: Goal: Level of anxiety will decrease Outcome: Progressing   Problem: Elimination: Goal: Will not experience complications related to bowel motility Outcome: Progressing Goal: Will not experience complications related to urinary retention Outcome: Progressing   Problem: Pain Management: Goal: General experience of comfort will improve Outcome: Progressing   Problem: Safety: Goal: Ability to remain free from injury will improve Outcome: Progressing   Problem: Skin Integrity: Goal: Risk for impaired skin integrity will decrease Outcome: Progressing   Problem: Education: Goal: Ability to demonstrate management of disease process will improve Outcome: Progressing Goal: Ability to verbalize understanding of medication therapies will improve Outcome: Progressing Goal: Individualized Educational Video(s) Outcome: Progressing   Problem: Activity: Goal: Capacity to carry out activities will improve Outcome: Progressing   Problem: Cardiac: Goal: Ability to achieve and maintain adequate cardiopulmonary perfusion will improve Outcome: Progressing   Problem: Education: Goal: Knowledge of disease or condition will improve Outcome: Progressing Goal: Knowledge of the prescribed therapeutic regimen will improve Outcome: Progressing Goal: Individualized Educational Video(s) Outcome: Progressing   Problem: Activity: Goal: Ability to tolerate increased activity will improve Outcome: Progressing Goal: Will verbalize the importance of balancing activity with adequate rest periods Outcome: Progressing   Problem: Respiratory: Goal: Ability to maintain a clear airway will improve Outcome: Progressing Goal: Levels of oxygenation will improve Outcome:  Progressing Goal: Ability to maintain adequate ventilation will improve Outcome: Progressing   Problem: Activity: Goal: Ability to tolerate increased activity will improve Outcome: Progressing   Problem: Clinical Measurements: Goal: Ability to maintain a body temperature in the normal range will improve Outcome: Progressing   Problem: Respiratory: Goal: Ability to maintain adequate ventilation will improve Outcome: Progressing Goal: Ability to maintain a clear airway will improve Outcome: Progressing   Problem: Education: Goal: Understanding of CV disease, CV risk reduction, and recovery process will improve Outcome: Progressing Goal: Individualized Educational Video(s)  Outcome: Progressing   Problem: Activity: Goal: Ability to return to baseline activity level will improve Outcome: Progressing

## 2023-05-10 NOTE — Discharge Summary (Addendum)
Physician Discharge Summary   Patient: Christopher Pruitt MRN: [id number] DOB: 05/09/1946  Admit date:     05/06/2023  Discharge date: 05/10/23  Discharge Physician: Arnetha Courser   PCP: Danella Penton, MD   Recommendations at discharge:  Please obtain CBC and BMP on follow-up Follow-up with cardiology Follow-up with primary care provider  Discharge Diagnoses: Principal Problem:   Acute respiratory failure with hypoxia [facility]) Active Problems:   NSTEMI (non-ST elevated myocardial infarction) (HCC)   Elevated brain natriuretic peptide (BNP) level   Pneumonia   COPD (chronic obstructive pulmonary disease) (HCC)   Hypertension   CKD (chronic kidney disease) stage 3, GFR 30-59 ml/min (HCC)   DM type 2 with diabetic mixed hyperlipidemia (HCC)   Hypothyroidism   Hyperlipidemia, mixed   History of prostate cancer   Hospital Course: Taken from H&P.  Christopher Pruitt is a 77 y.o. male with medical history significant of COPD/interstitial lung disease, type 2 diabetes, stage III CKD, hypertension, hyperlipidemia presenting with acute respiratory failure with hypoxia, elevated BNP, pneumonia, elevated troponin.  Patient reports sudden onset of cough and shortness of breath earlier this morning.  No chest pain.  Has some?  Trace hemoptysis with coughing.  Mild wheezing.  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   Patient initially required BiPAP, able to transition to 2 L this morning.  BNP 425, troponin 44 >>2272-cardiology was consulted and he was started on heparin infusion.  CTA negative for PE but did show pulmonary edema.  Also started on IV diuresis and antibiotics until pneumonia ruled out.  12/27: Vital stable on room air now.  COVID-19, influenza and RSV negative, Troponin peaked at 7934, strep pneumo negative, procalcitonin 0.11 Hemoglobin decreased to 9.7 from 11.4 on admission without any obvious bleeding, slight worsening of creatinine  to 1.61.  Doubt this is any upper respiratory infection or pneumonia, likely NSTEMI with pulmonary edema.  Echocardiogram with low normal EF and no regional wall motion abnormalities.  Discussed with cardiology but they are planning to do cardiac cath on Monday stating that it is not emergent.  12/28: Vitals and labs mostly stable, small improvement in creatinine to 1.54, troponin continued to trend down after peaking at 7934.  Procalcitonin remain negative.  Patient remained on heparin infusion.  Cardiac cath on Monday.  Few basal crackles so giving 1 more dose of IV Lasix  12/29: Remained hemodynamically stable, cardiac cath tomorrow.  12/30: Hemodynamically remained stable, heart catheterization today with diffuse disease and they were unable to locate the culprit for this current NSTEMI.  As patient remained chest pain-free they are recommending outpatient evaluation at [facility] for CABG.  Patient was started on low-dose aspirin, added low-dose Lasix and metoprolol, home lisinopril dose was decreased to 10 mg daily to accommodate Lasix and metoprolol.  Patient will continue on current medications and need to have a close follow-up with his providers for further management.  Assessment and Plan: * Acute respiratory failure with hypoxia (HCC) Resolved, now back to room air.  Likely due to flash pulmonary edema with NSTEMI. 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 Procalcitonin at 0.11>><0.10  no pneumonia. No wheezing. -Continue with supportive care  NSTEMI (non-ST elevated myocardial infarction) (HCC)  Troponin peaked around 8000. EKG sinus tach w/ bigeminy  Cardiology  is on board and patient was started on heparin infusion. No chest pain, echocardiogram with low normal EF and no regional wall motion abnormalities. Cardiology is planning to do cardiac cath on Monday. -Continue with heparin infusion  Elevated brain natriuretic peptide (BNP) level Decompensated respiratory failure initially requiring BiPAP and now back to room air with IV diuresis, noted elevated BNP of 424 and concern for pulmonary edema on imaging.  Concern of NSTEMI Echocardiogram with low normal EF with normal diastolic function, mildly elevated pulmonary pressure. -Giving 1 more dose of IV Lasix today due to few basal crackles  Pneumonia CT chest with concern of bilateral infiltrate with differential of pulmonary edema versus atypical pneumonia.  Procalcitonin at 0.11>><0.10.  Patient was started on Rocephin and Zithromax. Pneumonia ruled out as there is not much upper respiratory symptoms, afebrile and no leukocytosis. -Stopping antibiotics   COPD (chronic obstructive pulmonary disease) (HCC) Patient with no wheezing.  CT chest concerning for underlying interstitial lung disease. -Continue with bronchodilator -Holding steroid for now  Hypertension Blood pressure currently within goal. -Holding home lisinopril -Continue to monitor  CKD (chronic kidney disease) stage 3, GFR 30-59 ml/min (HCC) Slight worsening creatinine at 1.61, GFR in the 40s Appears near baseline Monitor renal function Avoid nephrotoxins  DM type 2 with diabetic mixed hyperlipidemia (HCC) Seems well-controlled with A1c of 5.9. -Continue with SSI  Hypothyroidism -Continue home Synthroid  Hyperlipidemia, mixed Continue Crestor  History of prostate cancer Per patient he is taking some experimental drug by his urologist, Do not see anything in his record. -No acute concern -Continue with outpatient follow-up   Consultants: Cardiology Procedures performed: Cardiac catheterization Disposition:  Home Diet recommendation:  Discharge Diet Orders (From admission, onward)     Start     Ordered   05/10/23 0000  Diet - low sodium heart healthy        05/10/23 1040           Cardiac and Carb modified diet DISCHARGE MEDICATION: Allergies as of 05/10/2023       Reactions   Amoxicillin-pot Clavulanate Diarrhea, Nausea And Vomiting   Did it involve swelling of the face/tongue/throat, SOB, or low BP? No Did it involve sudden or severe rash/hives, skin peeling, or any reaction on the inside of your mouth or nose? No Did you need to seek medical attention at a hospital or doctor's office? No When did it last happen?      last year or so If all above answers are "NO", may proceed with cephalosporin use. Did it involve swelling of the face/tongue/throat, SOB, or low BP? No  Did it involve sudden or severe rash/hives, skin peeling, or any reaction on the inside of your mouth or nose? No  Did you need to seek medical attention at a hospital or doctor's office? No  When did it last happen?      last year or so  If all above answers are "NO", may proceed with cephalosporin use.   Oxycodone Other (See Comments)   Confusion   Clindamycin/lincomycin Itching   Nalfon [fenoprofen Calcium] Hives   Robaxin [methocarbamol] Hives        Medication List     TAKE these medications    acetaminophen 650 MG CR tablet Commonly known as: TYLENOL Take 650 mg by mouth every 8 (eight) hours as needed for pain.   albuterol 108 (90 Base) MCG/ACT inhaler Commonly known as: VENTOLIN HFA  Inhale 2 puffs into the lungs every 6 (six) hours as needed for wheezing or shortness of breath.   allopurinol 300 MG tablet Commonly known as: ZYLOPRIM Take 300 mg by mouth every morning.   aspirin EC 81 MG tablet Take 1 tablet (81 mg total) by mouth daily. Swallow whole.   furosemide 20 MG tablet Commonly known as: LASIX Take 1 tablet (20 mg total) by mouth daily. Start taking on: May 11, 2023    Gemtesa 75 MG Tabs Generic drug: Vibegron Take 1 tablet (75 mg total) by mouth daily.   ipratropium-albuterol 0.5-2.5 (3) MG/3ML Soln Commonly known as: DUONEB Take 3 mLs by nebulization every 6 (six) hours as needed (wheezing, shortness of breath).   levothyroxine 125 MCG tablet Commonly known as: SYNTHROID Take 125 mcg by mouth daily before breakfast.   lisinopril 10 MG tablet Commonly known as: ZESTRIL Take 1 tablet (10 mg total) by mouth daily. Start taking on: May 11, 2023 What changed:  medication strength how much to take   metFORMIN 1000 MG tablet Commonly known as: GLUCOPHAGE Take 1,000 mg by mouth daily with breakfast.   metoprolol succinate 25 MG 24 hr tablet Commonly known as: TOPROL-XL Take 1 tablet (25 mg total) by mouth daily. Start taking on: May 11, 2023   rosuvastatin 20 MG tablet Commonly known as: CRESTOR Take 20 mg by mouth at bedtime.               Durable Medical Equipment  (From admission, onward)           Start     Ordered   05/10/23 1213  For home use only DME Nebulizer machine  Once       Question Answer Comment  Patient needs a nebulizer to treat with the following condition COPD exacerbation (HCC)   Length of Need Lifetime   Additional equipment included Administration kit   Additional equipment included Filter      05/10/23 1212            Follow-up Information     Paraschos, Alexander, MD. Go in 1 week(s).   Specialty: Cardiology Why: Appointment on Tuesday, 05/18/2023 at 3:30pm with Dr. Geoffry Paradise information: [address] [facility] [place] [address] [phone]         Danella Penton, MD. Schedule an appointment as soon as possible for a visit in 1 week(s).   Specialty: Internal Medicine Contact information: [address] [place] [address] [phone]                Discharge Exam: Ceasar Mons Weights   05/06/23 0347  Weight: 92 kg    General.  Well-developed elderly man, in no acute distress. Pulmonary.  Lungs clear bilaterally, normal respiratory effort. CV.  Regular rate and rhythm, no JVD, rub or murmur. Abdomen.  Soft, nontender, nondistended, BS positive. CNS.  Alert and oriented .  No focal neurologic deficit. Extremities.  No edema, no cyanosis, pulses intact and symmetrical. Psychiatry.  Judgment and insight appears normal.   Condition at discharge: stable  The results of significant diagnostics from this hospitalization (including imaging, microbiology, ancillary and laboratory) are listed below for reference.   Imaging Studies: CARDIAC CATHETERIZATION Result Date: 05/10/2023   Prox RCA to Mid RCA lesion is 90% stenosed.   1st Mrg lesion is 80% stenosed.   Dist Cx lesion is 75% stenosed.   Ost LAD to Prox LAD lesion is 40% stenosed.   Prox LAD to  Mid LAD lesion is 75% stenosed.   LPAV lesion is 95% stenosed.   There is moderate left ventricular systolic dysfunction.   The left ventricular ejection fraction is 25-35% by visual estimate. 1.  NSTEMI 2.  Three-vessel coronary artery disease with eccentric 75% stenosis mid LAD, 80% stenosis OM1, 75% stenosis distal left circumflex, 95% stenosis L PAD, 90% stenosis nondominant mid RCA 3.  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  Start good medical management 2.  Maximize hyperlipidemia management 3.  Discharge home later today, follow-up with me 1 week 4.  Ref[facility] to [facility] as outpatient to consider CABG   ECHOCARDIOGRAM COMPLETE Result Date: 05/07/2023    ECHOCARDIOGRAM REPORT   Patient Name:   Christopher Pruitt Date of Exam: 05/07/2023 Medical Rec #:  [id number]     Height:       68.0 in Accession #:    [id number]    Weight:       202.8 lb Date of Birth:  [birth year]     BSA:          2.056 m Patient Age:    77 years      BP:           106/63 mmHg Patient Gender: M             HR:           85 bpm. Exam Location:  [facility] Procedure: 2D  Echo, Cardiac Doppler, Color Doppler and Intracardiac            Opacification Agent Indications:     CHF  History:         Patient has no prior history of Echocardiogram examinations.                  CHF, COPD; Risk Factors:Dyslipidemia, Diabetes and Sleep Apnea.                  CKD.  Sonographer:     Mikki Harbor Referring Phys:  [id number] CARALYN HUDSON Diagnosing Phys: Marcina Millard MD  Sonographer Comments: Technically difficult study due to poor echo windows. IMPRESSIONS  1. Left ventricular ejection fraction, by estimation, is 50 to 55%. The left ventricle has low normal function. The left ventricle has no regional wall motion abnormalities. Left ventricular diastolic parameters were normal.  2. Right ventricular systolic function is normal. The right ventricular size is normal. There is mildly elevated pulmonary artery systolic pressure.  3. The mitral valve is normal in structure. Mild mitral valve regurgitation. No evidence of mitral stenosis.  4. The aortic valve is normal in structure. Aortic valve regurgitation is trivial. Mild aortic valve stenosis.  5. The inferior vena cava is normal in size with greater than 50% respiratory variability, suggesting right atrial pressure of 3 mmHg. FINDINGS  Left Ventricle: Left ventricular ejection fraction, by estimation, is 50 to 55%. The left ventricle has low normal function. The left ventricle has no regional wall motion abnormalities. Definity contrast agent was given IV to delineate the left ventricular endocardial borders. The left ventricular internal cavity size was normal in size. There is no left ventricular hypertrophy. Left ventricular diastolic parameters were normal. Right Ventricle: The right ventricular size is normal. No increase in right ventricular wall thickness. Right ventricular systolic function is normal. There is mildly elevated pulmonary artery systolic pressure. The tricuspid regurgitant velocity is 2.87  m/s, and with an  assumed right atrial pressure of 8 mmHg, the estimated right  ventricular systolic pressure is 40.9 mmHg. Left Atrium: Left atrial size was normal in size. Right Atrium: Right atrial size was normal in size. Pericardium: There is no evidence of pericardial effusion. Mitral Valve: The mitral valve is normal in structure. Mild mitral valve regurgitation. No evidence of mitral valve stenosis. MV peak gradient, 5.4 mmHg. The mean mitral valve gradient is 4.0 mmHg. Tricuspid Valve: The tricuspid valve is normal in structure. Tricuspid valve regurgitation is mild . No evidence of tricuspid stenosis. Aortic Valve: The aortic valve is normal in structure. Aortic valve regurgitation is trivial. Mild aortic stenosis is present. Aortic valve mean gradient measures 14.2 mmHg. Aortic valve peak gradient measures 26.0 mmHg. Aortic valve area, by VTI measures 2.02 cm. Pulmonic Valve: The pulmonic valve was normal in structure. Pulmonic valve regurgitation is not visualized. No evidence of pulmonic stenosis. Aorta: The aortic root is normal in size and structure. Venous: The inferior vena cava is normal in size with greater than 50% respiratory variability, suggesting right atrial pressure of 3 mmHg. IAS/Shunts: No atrial level shunt detected by color flow Doppler.  LEFT VENTRICLE PLAX 2D LVIDd:         5.00 cm   Diastology LVIDs:         3.60 cm   LV e' lateral:   8.05 cm/s LV PW:         1.40 cm   LV E/e' lateral: 15.7 LV IVS:        1.50 cm LVOT diam:     2.10 cm LV SV:         105 LV SV Index:   51 LVOT Area:     3.46 cm  RIGHT VENTRICLE RV Basal diam:  4.00 cm RV Mid diam:    4.10 cm RV S prime:     14.00 cm/s TAPSE (M-mode): 2.4 cm LEFT ATRIUM              Index        RIGHT ATRIUM           Index LA diam:        4.60 cm  2.24 cm/m   RA Area:     18.60 cm LA Vol (A2C):   108.0 ml 52.53 ml/m  RA Volume:   50.30 ml  24.47 ml/m LA Vol (A4C):   98.4 ml  47.86 ml/m LA Biplane Vol: 107.0 ml 52.04 ml/m  AORTIC VALVE                      PULMONIC VALVE AV Area (Vmax):    1.94 cm      PV Vmax:       1.30 m/s AV Area (Vmean):   1.83 cm      PV Peak grad:  6.8 mmHg AV Area (VTI):     2.02 cm AV Vmax:           255.00 cm/s AV Vmean:          172.750 cm/s AV VTI:            0.522 m AV Peak Grad:      26.0 mmHg AV Mean Grad:      14.2 mmHg LVOT Vmax:         142.50 cm/s LVOT Vmean:        91.450 cm/s LVOT VTI:          0.304 m LVOT/AV VTI ratio: 0.58  AORTA Ao Root diam: 3.70 cm Ao  Asc diam:  3.50 cm MITRAL VALVE                TRICUSPID VALVE MV Area (PHT): 4.83 cm     TR Peak grad:   32.9 mmHg MV Area VTI:   3.65 cm     TR Vmax:        287.00 cm/s MV Peak grad:  5.4 mmHg MV Mean grad:  4.0 mmHg     SHUNTS MV Vmax:       1.16 m/s     Systemic VTI:  0.30 m MV Vmean:      90.9 cm/s    Systemic Diam: 2.10 cm MV Decel Time: 157 msec MV E velocity: 126.00 cm/s MV A velocity: 99.60 cm/s MV E/A ratio:  1.27 Marcina Millard MD Electronically signed by Marcina Millard MD Signature Date/Time: 05/07/2023/12:46:31 PM    Final    CT Angio Chest PE W and/or Wo Contrast Result Date: 05/06/2023 CLINICAL DATA:  Shortness of breath. EXAM: CT ANGIOGRAPHY CHEST WITH CONTRAST TECHNIQUE: Multidetector CT imaging of the chest was performed using the standard protocol during bolus administration of intravenous contrast. Multiplanar CT image reconstructions and MIPs were obtained to evaluate the vascular anatomy. RADIATION DOSE REDUCTION: This exam was performed according to the departmental dose-optimization program which includes automated exposure control, adjustment of the mA and/or kV according to patient size and/or use of iterative reconstruction technique. CONTRAST:  75mL OMNIPAQUE IOHEXOL 350 MG/ML SOLN COMPARISON:  March 10, 2023. FINDINGS: Cardiovascular: Satisfactory opacification of the pulmonary arteries to the segmental level. No evidence of pulmonary embolism. Normal heart size. No pericardial effusion. Coronary artery calcifications  are noted. Mediastinum/Nodes: No enlarged mediastinal, hilar, or axillary lymph nodes. Thyroid gland, trachea, and esophagus demonstrate no significant findings. Lungs/Pleura: Small bilateral pleural effusions are noted with minimal adjacent subsegmental atelectasis. No pneumothorax is noted. Increased septal thickening is noted throughout both lungs with increased patchy airspace opacities concerning for edema or multifocal pneumonia superimposed upon chronic interstitial lung disease. Upper Abdomen: No acute abnormality. Musculoskeletal: No chest wall abnormality. No acute or significant osseous findings. Review of the MIP images confirms the above findings. IMPRESSION: No definite evidence of pulmonary embolus. Small bilateral pleural effusions are noted with minimal adjacent subsegmental atelectasis. Increased septal thickening is noted throughout both lungs with increased patchy airspace opacities concerning for acute edema or multifocal pneumonia superimposed upon chronic interstitial lung disease. Coronary artery calcifications are noted. Aortic Atherosclerosis (ICD10-I70.0). Electronically Signed   By: Lupita Raider M.D.   On: 05/06/2023 09:18   DG Chest Portable 1 View Result Date: 05/06/2023 CLINICAL DATA:  77 year old male with shortness of breath, hypoxia. COPD. EXAM: PORTABLE CHEST 1 VIEW COMPARISON:  Chest CT 03/10/2023 and earlier. FINDINGS: Portable AP upright view at 0641 hours. Bilateral subpleural lung scarring and early lung base honeycombing on the October CT. Compared to the scout view at that time lung volumes are mildly lower and generalized increased pulmonary interstitial opacity has progressed. Blunting of the lung bases appears stable, with no pleural effusion on the prior. No pneumothorax or consolidation. Mediastinal contours are within normal limits. Visualized tracheal air column is within normal limits. Paucity of bowel gas. No acute osseous abnormality identified. IMPRESSION:  Chronic interstitial lung disease with increased bilateral interstitial pulmonary opacity compared to October CT. Consider superimposed pulmonary edema versus viral/atypical respiratory infection. Electronically Signed   By: Odessa Fleming M.D.   On: 05/06/2023 07:01    Microbiology: Results for orders placed or performed  during the hospital encounter of 05/06/23  Blood culture (routine x 2)     Status: None (Preliminary result)   Collection Time: 05/06/23  6:34 AM   Specimen: BLOOD  Result Value Ref Range Status   Specimen Description BLOOD RIGHT ARM  Final   Special Requests   Final    BOTTLES DRAWN AEROBIC AND ANAEROBIC Blood Culture adequate volume   Culture   Final    NO GROWTH 4 DAYS Performed at [facility], [address], [place], [place] [address]    Report Status PENDING  Incomplete  Blood culture (routine x 2)     Status: None (Preliminary result)   Collection Time: 05/06/23  6:38 AM   Specimen: BLOOD  Result Value Ref Range Status   Specimen Description BLOOD BLOOD LEFT ARM  Final   Special Requests   Final    BOTTLES DRAWN AEROBIC AND ANAEROBIC Blood Culture adequate volume   Culture   Final    NO GROWTH 4 DAYS Performed at [facility], [address] [place], [place] [address]    Report Status PENDING  Incomplete  Resp panel by RT-PCR (RSV, Flu A&B, Covid) Anterior Nasal Swab     Status: None   Collection Time: 05/06/23  9:20 AM   Specimen: Anterior Nasal Swab  Result Value Ref Range Status   SARS Coronavirus 2 by RT PCR NEGATIVE NEGATIVE Final    Comment: (NOTE) SARS-CoV-2 target nucleic acids are NOT DETECTED.  The SARS-CoV-2 RNA is generally detectable in upper respiratory specimens during the acute phase of infection. The lowest concentration of SARS-CoV-2 viral copies this assay can detect is 138 copies/mL. A negative result does not preclude SARS-Cov-2 infection and should not be used as the sole basis for treatment or other patient  management decisions. A negative result may occur with  improper specimen collection/handling, submission of specimen other than nasopharyngeal swab, presence of viral mutation(s) within the areas targeted by this assay, and inadequate number of viral copies(<138 copies/mL). A negative result must be combined with clinical observations, patient history, and epidemiological information. The expected result is Negative.  Fact Sheet for Patients:  BloggerCourse.com  Fact Sheet for Healthcare Providers:  SeriousBroker.it  This test is no t yet approved or cleared by the [place] FDA and  has been authorized for detection and/or diagnosis of SARS-CoV-2 by FDA under an Emergency Use Authorization (EUA). This EUA will remain  in effect (meaning this test can be used) for the duration of the COVID-19 declaration under Section 564(b)(1) of the Act, 21 U.S.C.section 360bbb-3(b)(1), unless the authorization is terminated  or revoked sooner.       Influenza A by PCR NEGATIVE NEGATIVE Final   Influenza B by PCR NEGATIVE NEGATIVE Final    Comment: (NOTE) The Xpert Xpress SARS-CoV-2/FLU/RSV plus assay is intended as an aid in the diagnosis of influenza from Nasopharyngeal swab specimens and should not be used as a sole basis for treatment. Nasal washings and aspirates are unacceptable for Xpert Xpress SARS-CoV-2/FLU/RSV testing.  Fact Sheet for Patients: BloggerCourse.com  Fact Sheet for Healthcare Providers: SeriousBroker.it  This test is not yet approved or cleared by the [place] FDA and has been authorized for detection and/or diagnosis of SARS-CoV-2 by FDA under an Emergency Use Authorization (EUA). This EUA will remain in effect (meaning this test can be used) for the duration of the COVID-19 declaration under Section 564(b)(1) of the Act, 21 U.S.C. section 360bbb-3(b)(1),  unless the authorization is terminated  or revoked.     Resp Syncytial Virus by PCR NEGATIVE NEGATIVE Final    Comment: (NOTE) Fact Sheet for Patients: BloggerCourse.com  Fact Sheet for Healthcare Providers: SeriousBroker.it  This test is not yet approved or cleared by the [place] FDA and has been authorized for detection and/or diagnosis of SARS-CoV-2 by FDA under an Emergency Use Authorization (EUA). This EUA will remain in effect (meaning this test can be used) for the duration of the COVID-19 declaration under Section 564(b)(1) of the Act, 21 U.S.C. section 360bbb-3(b)(1), unless the authorization is terminated or revoked.  Performed at [facility], [address] [place], [place] [address]   Expectorated Sputum Assessment w Gram Stain, Rflx to Resp Cult     Status: None   Collection Time: 05/06/23 10:02 AM   Specimen: Urine, Clean Catch; Sputum  Result Value Ref Range Status   Specimen Description EXPECTORATED SPUTUM  Final   Special Requests NONE  Final   Sputum evaluation   Final    Sputum specimen not acceptable for testing.  Please recollect.   RESULT CALLED TO, READ BACK BY AND VERIFIED WITH: LES WILLOUGHBY 05/06/23 1055 KLW Performed at [facility], [address] [place], [place] [address]    Report Status 05/06/2023 FINAL  Final    Labs: CBC: Recent Labs  Lab 05/06/23 0634 05/07/23 0612 05/08/23 0442 05/09/23 0451 05/10/23 0314  WBC 4.1 4.7 6.1 5.4 4.6  NEUTROABS 2.5  --   --   --   --   HGB 11.4* 9.5* 9.3* 10.0* 9.9*  HCT 33.6* 27.8* 27.2* 28.8* 28.7*  MCV 88.2 86.3 88.0 84.7 86.7  PLT 114* 111* 112* 106* 107*   Basic Metabolic Panel: Recent Labs  Lab 05/06/23 0634 05/07/23 0612 05/08/23 0442 05/09/23 0451  NA 136 138 135 136  K 3.6 4.3 3.9 4.0  CL 103 103 101 102  CO2 23 22 24 25   GLUCOSE 158* 184* 133* 109*  BUN 28* 34* 43* 32*  CREATININE 1.45* 1.61* 1.54*  1.33*  CALCIUM 8.8* 9.0 8.7* 8.7*  MG 1.6*  --   --   --    Liver Function Tests: Recent Labs  Lab 05/06/23 0634 05/07/23 0612  AST 17 30  ALT 12 14  ALKPHOS 73 67  BILITOT 0.9 1.0  PROT 7.3 7.0  ALBUMIN 4.1 3.9   CBG: Recent Labs  Lab 05/09/23 0848 05/09/23 1323 05/09/23 1625 05/09/23 2225 05/10/23 0722  GLUCAP 115* 187* 116* 128* 122*    Discharge time spent: greater than 30 minutes.  This record has been created using Conservation officer, historic buildings. Errors have been sought and corrected,but may not always be located. Such creation errors do not reflect on the standard of care.   Signed: Arnetha Courser, MD [facility] 05/10/2023

## 2023-05-10 NOTE — Progress Notes (Signed)
Transition of Care [facility] - Inpatient Brief Assessment   Patient Details  Name: ASHA SEARS MRN: [id number] Date of Birth: [birth year]  Transition of Care [facility] CM/SW Contact:    Truddie Hidden, RN Phone Number: 05/10/2023, 10:43 AM   Clinical Narrative: TOC continuing to follow patient's progress throughout discharge planning.   Transition of Care Asessment: Insurance and Status: Insurance coverage has been reviewed Patient has primary care physician: Yes Home environment has been reviewed: Home Prior level of function:: independent Prior/Current Home Services: No current home services Social Drivers of Health Review: SDOH reviewed no interventions necessary Readmission risk has been reviewed: Yes Transition of care needs: no transition of care needs at this time

## 2023-05-10 NOTE — Progress Notes (Signed)
[facility] CLINIC CARDIOLOGY PROGRESS NOTE       Patient ID: Christopher Pruitt MRN: [id number] DOB/AGE: [birth year] 77 y.o.  Admit date: 05/06/2023 Referring Physician Dr. Doree Albee Primary Physician Danella Penton, MD  Primary Cardiologist None Reason for Consultation NSTEMI, AoCHF  HPI: Christopher Pruitt is a 77 y.o. male  with a past medical history of COPD/interstitial lung disease, type 2 diabetes, stage III CKD, hypertension, hyperlipidemia who presented to the ED on 05/06/2023 for sudden onset SOB and cough. Cardiology was consulted for further evaluation.   Intervel history: -Patient seen and examined this AM, reports SOB has resolved.  -Underwent LHC this AM, denies any issues afterwards. No CP, no pain/bleeding at R radial access site. -BP and HR remain controlled.   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, ED&C   Urge urinary incontinence    Wears glasses    Wears hearing aid    BILATERAL    Past Surgical History:  Procedure Laterality Date   CARPAL TUNNEL RELEASE Left 10/25/2018   Procedure: CARPAL TUNNEL RELEASE ENDOSCOPIC LEFT, DIABETIC, SLEEPAPNEA;  Surgeon: Christena Flake, MD;  Location: [facility] ORS;  Service: Orthopedics;  Laterality: Left;   CARPAL TUNNEL RELEASE Right 02/01/2019   Procedure: CARPAL TUNNEL RELEASE ENDOSCOPIC;  Surgeon: Christena Flake, MD;  Location: [facility] ORS;  Service: Orthopedics;  Laterality: Right;    colonoscopy with polypectomy     COLONOSCOPY WITH PROPOFOL N/A 07/27/2018   Procedure: COLONOSCOPY WITH PROPOFOL;  Surgeon: Toledo, Boykin Nearing, MD;  Location: [facility] ENDOSCOPY;  Service: Gastroenterology;  Laterality: N/A;   HYDROCELE EXCISION Left 02/11/2015   Procedure: LEFT HYDROCELECTOMY ADULT;  Surgeon: Hildred Laser, MD;  Location: [facility];  Service: Urology;  Laterality: Left;   INGUINAL HERNIA REPAIR Bilateral 04/18/2021   Procedure: LAPAROSCOPIC BILATERAL  HERNIA REPAIR WITH MESH, TRANSABDOMINAL PLANE BLOCK, LEFT INCARCARATED SCROTAL HERNIA, RIGHT INGUINAL HERNIA REPAIR;  Surgeon: Karie Soda, MD;  Location: [facility] ORS;  Service: General;  Laterality: Bilateral;   JOINT REPLACEMENT     Rt knee   PARTIAL KNEE ARTHROPLASTY Right 2012   PLACEMENT GOLD STUDS IN PROSTATE  2014   [place]   for [facility] Radiation (prostate cancer)   SHOULDER OPEN ROTATOR CUFF REPAIR Left 2010   TONSILLECTOMY  as child    Medications Prior to Admission  Medication Sig Dispense Refill Last Dose/Taking   acetaminophen (TYLENOL) 650 MG CR tablet Take 650 mg by mouth every 8 (eight) hours as needed for pain.   05/05/2023   allopurinol (ZYLOPRIM) 300 MG tablet Take 300 mg by mouth every morning.   05/05/2023   levothyroxine (SYNTHROID, LEVOTHROID) 125 MCG tablet Take 125  mcg by mouth daily before breakfast.   05/05/2023   lisinopril (ZESTRIL) 20 MG tablet Take 20 mg by mouth daily.   05/05/2023   metFORMIN (GLUCOPHAGE) 1000 MG tablet Take 1,000 mg by mouth daily with breakfast.   05/05/2023   rosuvastatin (CRESTOR) 20 MG tablet Take 20 mg by mouth at bedtime.   05/05/2023   Vibegron (GEMTESA) 75 MG TABS Take 1 tablet (75 mg total) by mouth daily. (Patient not taking: Reported on 05/07/2023)   05/05/2023   Social History   Socioeconomic History   Marital status: Married    Spouse name: Not on file   Number of children: Not on file   Years of education: Not on file   Highest education  level: Not on file  Occupational History   Not on file  Tobacco Use   Smoking status: Former    Current packs/day: 0.00    Types: Cigarettes    Start date: 25    Quit date: 97    Years since quitting: 35.0    Passive exposure: Past   Smokeless tobacco: Never  Vaping Use   Vaping status: Never Used  Substance and Sexual Activity   Alcohol use: No   Drug use: No   Sexual activity: Not on file  Other Topics Concern   Not on file  Social History Narrative   Not on file   Social Drivers of Health   Financial Resource Strain: Low Risk  (03/26/2023)   [facility]   Overall Financial Resource Strain (CARDIA)    Difficulty of Paying Living Expenses: Not hard at all  Food Insecurity: No Food Insecurity (05/08/2023)   Hunger Vital Sign    Worried About Running Out of Food in the Last Year: Never true    Ran Out of Food in the Last Year: Never true  Transportation Needs: No Transportation Needs (05/08/2023)   PRAPARE - Administrator, Civil Service (Medical): No    Lack of Transportation (Non-Medical): No  Physical Activity: Not on file  Stress: Not on file  Social Connections: Not on file  Intimate Partner Violence: Not At Risk (05/08/2023)   Humiliation, Afraid, Rape, and Kick questionnaire    Fear of Current or Ex-Partner: No    Emotionally Abused: No    Physically Abused: No    Sexually Abused: No    History reviewed. No pertinent family history.   Vitals:   05/10/23 0900 05/10/23 0915 05/10/23 0930 05/10/23 0940  BP: 131/72 128/68 124/67 122/73  Pulse: (!) 58 65 61 65  Resp: 20 12 15 15   Temp:      TempSrc:      SpO2: 96% 96% 94% 96%  Weight:      Height:        PHYSICAL EXAM General: Ill appearing male, well nourished, in no acute distress. HEENT: Normocephalic and atraumatic. Neck: No JVD.  Lungs: Normal respiratory effort on RA.  Sounds diminished bilaterally.  Heart: HRRR. Normal S1 and S2 without gallops or  murmurs.  Abdomen: Non-distended appearing.  Msk: Normal strength and tone for age. Extremities: Warm and well perfused. No clubbing, cyanosis. Trace pitting edema.  Neuro: Alert and oriented X 3. Psych: Answers questions appropriately.   Labs: Basic Metabolic Panel: Recent Labs    05/08/23 0442 05/09/23 0451  NA 135 136  K 3.9 4.0  CL 101 102  CO2 24 25  GLUCOSE 133* 109*  BUN 43* 32*  CREATININE 1.54* 1.33*  CALCIUM 8.7* 8.7*   Liver Function Tests: No results for input(s): "AST", "ALT", "ALKPHOS", "BILITOT", "PROT", "ALBUMIN" in the last 72 hours.  No results for input(s): "LIPASE", "AMYLASE" in the last 72 hours. CBC: Recent Labs    05/09/23 0451 05/10/23 0314  WBC 5.4 4.6  HGB 10.0* 9.9*  HCT 28.8* 28.7*  MCV 84.7 86.7  PLT 106* 107*   Cardiac Enzymes: Recent Labs    05/08/23 0442  TROPONINIHS 1,897*   BNP: No results for input(s): "BNP" in the last 72 hours.  D-Dimer: No results for input(s): "DDIMER" in the last 72 hours. Hemoglobin A1C: No results for input(s): "HGBA1C" in the last 72 hours.  Fasting Lipid Panel: No results for input(s): "CHOL", "HDL", "LDLCALC", "TRIG", "CHOLHDL", "LDLDIRECT" in the last 72 hours. Thyroid Function Tests: No results for input(s): "TSH", "T4TOTAL", "T3FREE", "THYROIDAB" in the last 72 hours.  Invalid input(s): "FREET3" Anemia Panel: No results for input(s): "VITAMINB12", "FOLATE", "FERRITIN", "TIBC", "IRON", "RETICCTPCT" in the last 72 hours.   Radiology: CARDIAC CATHETERIZATION Result Date: 05/10/2023   Prox RCA to Mid RCA lesion is 90% stenosed.   1st Mrg lesion is 80% stenosed.   Dist Cx lesion is 75% stenosed.   Ost LAD to Prox LAD lesion is 40% stenosed.   Prox LAD to Mid LAD lesion is 75% stenosed.   LPAV lesion is 95% stenosed.   There is moderate left ventricular systolic dysfunction.   The left ventricular ejection fraction is 25-35% by visual estimate. 1.  NSTEMI 2.  Three-vessel coronary artery disease  with eccentric 75% stenosis mid LAD, 80% stenosis OM1, 75% stenosis distal left circumflex, 95% stenosis L PAD, 90% stenosis nondominant mid RCA 3.  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  Start good medical management 2.  Maximize hyperlipidemia management 3.  Discharge home later today, follow-up with me 1 week 4.  Refer to [facility] as outpatient to consider CABG   ECHOCARDIOGRAM COMPLETE Result Date: 05/07/2023    ECHOCARDIOGRAM REPORT   Patient Name:   Christopher Pruitt Date of Exam: 05/07/2023 Medical Rec #:  [id number]     Height:       68.0 in Accession #:    [id number]    Weight:       202.8 lb Date of Birth:  Oct 12, 1945     BSA:          2.056 m Patient Age:    77 years      BP:           106/63 mmHg Patient Gender: M             HR:           85 bpm. Exam Location:  [facility] Procedure: 2D Echo, Cardiac Doppler, Color Doppler and Intracardiac            Opacification Agent Indications:     CHF  History:         Patient has no prior history of Echocardiogram examinations.                  CHF, COPD; Risk Factors:Dyslipidemia, Diabetes and Sleep Apnea.                  CKD.  Sonographer:     Mikki Harbor Referring Phys:  [id number] Jaelin Fackler Diagnosing Phys: Marcina Millard MD  Sonographer Comments: Technically difficult study due to poor echo windows. IMPRESSIONS  1. Left ventricular ejection fraction, by estimation,  is 50 to 55%. The left ventricle has low normal function. The left ventricle has no regional wall motion abnormalities. Left ventricular diastolic parameters were normal.  2. Right ventricular systolic function is normal. The right ventricular size is normal. There is mildly elevated pulmonary artery systolic pressure.  3. The mitral valve is normal in structure. Mild mitral valve regurgitation. No evidence of mitral stenosis.  4. The aortic valve is normal in structure. Aortic valve regurgitation is trivial. Mild aortic valve stenosis.  5. The  inferior vena cava is normal in size with greater than 50% respiratory variability, suggesting right atrial pressure of 3 mmHg. FINDINGS  Left Ventricle: Left ventricular ejection fraction, by estimation, is 50 to 55%. The left ventricle has low normal function. The left ventricle has no regional wall motion abnormalities. Definity contrast agent was given IV to delineate the left ventricular endocardial borders. The left ventricular internal cavity size was normal in size. There is no left ventricular hypertrophy. Left ventricular diastolic parameters were normal. Right Ventricle: The right ventricular size is normal. No increase in right ventricular wall thickness. Right ventricular systolic function is normal. There is mildly elevated pulmonary artery systolic pressure. The tricuspid regurgitant velocity is 2.87  m/s, and with an assumed right atrial pressure of 8 mmHg, the estimated right ventricular systolic pressure is 40.9 mmHg. Left Atrium: Left atrial size was normal in size. Right Atrium: Right atrial size was normal in size. Pericardium: There is no evidence of pericardial effusion. Mitral Valve: The mitral valve is normal in structure. Mild mitral valve regurgitation. No evidence of mitral valve stenosis. MV peak gradient, 5.4 mmHg. The mean mitral valve gradient is 4.0 mmHg. Tricuspid Valve: The tricuspid valve is normal in structure. Tricuspid valve regurgitation is mild . No evidence of tricuspid stenosis. Aortic Valve: The aortic valve is normal in structure. Aortic valve regurgitation is trivial. Mild aortic stenosis is present. Aortic valve mean gradient measures 14.2 mmHg. Aortic valve peak gradient measures 26.0 mmHg. Aortic valve area, by VTI measures 2.02 cm. Pulmonic Valve: The pulmonic valve was normal in structure. Pulmonic valve regurgitation is not visualized. No evidence of pulmonic stenosis. Aorta: The aortic root is normal in size and structure. Venous: The inferior vena cava is normal  in size with greater than 50% respiratory variability, suggesting right atrial pressure of 3 mmHg. IAS/Shunts: No atrial level shunt detected by color flow Doppler.  LEFT VENTRICLE PLAX 2D LVIDd:         5.00 cm   Diastology LVIDs:         3.60 cm   LV e' lateral:   8.05 cm/s LV PW:         1.40 cm   LV E/e' lateral: 15.7 LV IVS:        1.50 cm LVOT diam:     2.10 cm LV SV:         105 LV SV Index:   51 LVOT Area:     3.46 cm  RIGHT VENTRICLE RV Basal diam:  4.00 cm RV Mid diam:    4.10 cm RV S prime:     14.00 cm/s TAPSE (M-mode): 2.4 cm LEFT ATRIUM              Index        RIGHT ATRIUM           Index LA diam:        4.60 cm  2.24 cm/m   RA Area:  18.60 cm LA Vol (A2C):   108.0 ml 52.53 ml/m  RA Volume:   50.30 ml  24.47 ml/m LA Vol (A4C):   98.4 ml  47.86 ml/m LA Biplane Vol: 107.0 ml 52.04 ml/m  AORTIC VALVE                     PULMONIC VALVE AV Area (Vmax):    1.94 cm      PV Vmax:       1.30 m/s AV Area (Vmean):   1.83 cm      PV Peak grad:  6.8 mmHg AV Area (VTI):     2.02 cm AV Vmax:           255.00 cm/s AV Vmean:          172.750 cm/s AV VTI:            0.522 m AV Peak Grad:      26.0 mmHg AV Mean Grad:      14.2 mmHg LVOT Vmax:         142.50 cm/s LVOT Vmean:        91.450 cm/s LVOT VTI:          0.304 m LVOT/AV VTI ratio: 0.58  AORTA Ao Root diam: 3.70 cm Ao Asc diam:  3.50 cm MITRAL VALVE                TRICUSPID VALVE MV Area (PHT): 4.83 cm     TR Peak grad:   32.9 mmHg MV Area VTI:   3.65 cm     TR Vmax:        287.00 cm/s MV Peak grad:  5.4 mmHg MV Mean grad:  4.0 mmHg     SHUNTS MV Vmax:       1.16 m/s     Systemic VTI:  0.30 m MV Vmean:      90.9 cm/s    Systemic Diam: 2.10 cm MV Decel Time: 157 msec MV E velocity: 126.00 cm/s MV A velocity: 99.60 cm/s MV E/A ratio:  1.27 Marcina Millard MD Electronically signed by Marcina Millard MD Signature Date/Time: 05/07/2023/12:46:31 PM    Final    CT Angio Chest PE W and/or Wo Contrast Result Date: 05/06/2023 CLINICAL DATA:   Shortness of breath. EXAM: CT ANGIOGRAPHY CHEST WITH CONTRAST TECHNIQUE: Multidetector CT imaging of the chest was performed using the standard protocol during bolus administration of intravenous contrast. Multiplanar CT image reconstructions and MIPs were obtained to evaluate the vascular anatomy. RADIATION DOSE REDUCTION: This exam was performed according to the departmental dose-optimization program which includes automated exposure control, adjustment of the mA and/or kV according to patient size and/or use of iterative reconstruction technique. CONTRAST:  75mL OMNIPAQUE IOHEXOL 350 MG/ML SOLN COMPARISON:  March 10, 2023. FINDINGS: Cardiovascular: Satisfactory opacification of the pulmonary arteries to the segmental level. No evidence of pulmonary embolism. Normal heart size. No pericardial effusion. Coronary artery calcifications are noted. Mediastinum/Nodes: No enlarged mediastinal, hilar, or axillary lymph nodes. Thyroid gland, trachea, and esophagus demonstrate no significant findings. Lungs/Pleura: Small bilateral pleural effusions are noted with minimal adjacent subsegmental atelectasis. No pneumothorax is noted. Increased septal thickening is noted throughout both lungs with increased patchy airspace opacities concerning for edema or multifocal pneumonia superimposed upon chronic interstitial lung disease. Upper Abdomen: No acute abnormality. Musculoskeletal: No chest wall abnormality. No acute or significant osseous findings. Review of the MIP images confirms the above findings. IMPRESSION: No definite evidence of pulmonary embolus. Small  bilateral pleural effusions are noted with minimal adjacent subsegmental atelectasis. Increased septal thickening is noted throughout both lungs with increased patchy airspace opacities concerning for acute edema or multifocal pneumonia superimposed upon chronic interstitial lung disease. Coronary artery calcifications are noted. Aortic Atherosclerosis (ICD10-I70.0).  Electronically Signed   By: Lupita Raider M.D.   On: 05/06/2023 09:18   DG Chest Portable 1 View Result Date: 05/06/2023 CLINICAL DATA:  77 year old male with shortness of breath, hypoxia. COPD. EXAM: PORTABLE CHEST 1 VIEW COMPARISON:  Chest CT 03/10/2023 and earlier. FINDINGS: Portable AP upright view at 0641 hours. Bilateral subpleural lung scarring and early lung base honeycombing on the October CT. Compared to the scout view at that time lung volumes are mildly lower and generalized increased pulmonary interstitial opacity has progressed. Blunting of the lung bases appears stable, with no pleural effusion on the prior. No pneumothorax or consolidation. Mediastinal contours are within normal limits. Visualized tracheal air column is within normal limits. Paucity of bowel gas. No acute osseous abnormality identified. IMPRESSION: Chronic interstitial lung disease with increased bilateral interstitial pulmonary opacity compared to October CT. Consider superimposed pulmonary edema versus viral/atypical respiratory infection. Electronically Signed   By: Odessa Fleming M.D.   On: 05/06/2023 07:01    ECHO as above  TELEMETRY reviewed by me 05/10/2023: sinus rhythm rate 70s  EKG reviewed by me: sinus tachycardia PVCs rate 105 bpm  Data reviewed by me 05/10/2023: last 24h vitals tele labs imaging I/O hospitalist progress note  Principal Problem:   Acute respiratory failure with hypoxia (HCC) Active Problems:   Hypothyroidism   CKD (chronic kidney disease) stage 3, GFR 30-59 ml/min (HCC)   COPD (chronic obstructive pulmonary disease) (HCC)   DM type 2 with diabetic mixed hyperlipidemia (HCC)   Hyperlipidemia, mixed   Elevated brain natriuretic peptide (BNP) level   Pneumonia   NSTEMI (non-ST elevated myocardial infarction) (HCC)   Hypertension   History of prostate cancer    ASSESSMENT AND PLAN:  Christopher Pruitt is a 77 y.o. male  with a past medical history of COPD/interstitial lung disease, type 2  diabetes, stage III CKD, hypertension, hyperlipidemia who presented to the ED on 05/06/2023 for sudden onset SOB and cough. Cardiology was consulted for further evaluation.   # Acute heart failure preserved EF # Acute hypoxic respiratory failure # COPD/ILD Patient presented with acute onset shortness of breath and cough earlier this morning.  BNP found to be elevated at 424.  CT chest with pulmonary edema versus pneumonia.  Echo from 2022 with preserved EF. Echo this admission with low normal EF.  -Continue lasix 20 mg daily.  -Management of possible pneumonia per primary. -Wean O2 as tolerated.  # NSTEMI # Coronary artery calcification by CT Patient presented with acute hypoxic respiratory failure/acute heart failure as above.  Troponins trended 44 > 2272 > 7934 > 7467 > 6946. LHC today with diffuse disease, given he is without chest pain will plan to set up with outpatient CABG evaluation at [facility].  -Start metoprolol 25 mg daily and lisinopril 10 mg daily.  -Continue crestor 20 mg daily and aspirin 81 mg daily.   # Chronic kidney disease stage III Patient with known history of CKD stage III.  Creatinine on admission 1.45. -Continue to monitor renal function closely with diuresis. -Monitor and replenish electrolytes for a goal K >4, Mag >2    Ok for discharge today from a cardiac perspective. Will arrange for follow up in clinic with Dr. Darrold Junker in  1-2 weeks.    This patient's plan of care was discussed and created with Dr. Juliann Pares and he is in agreement.  Signed: Gale Journey, PA-C  05/10/2023, 10:43 AM [facility]

## 2023-05-10 NOTE — Consult Note (Signed)
PHARMACY - ANTICOAGULATION CONSULT NOTE  Pharmacy Consult for Heparin Indication: NSTEMI  Allergies  Allergen Reactions   Amoxicillin-Pot Clavulanate Diarrhea and Nausea And Vomiting    Did it involve swelling of the face/tongue/throat, SOB, or low BP? No  Did it involve sudden or severe rash/hives, skin peeling, or any reaction on the inside of your mouth or nose? No  Did you need to seek medical attention at a hospital or doctor's office? No  When did it last happen?      last year or so  If all above answers are "NO", may proceed with cephalosporin use.  Did it involve swelling of the face/tongue/throat, SOB, or low BP? No  Did it involve sudden or severe rash/hives, skin peeling, or any reaction on the inside of your mouth or nose? No  Did you need to seek medical attention at a hospital or doctor's office? No  When did it last happen?      last year or so  If all above answers are "NO", may proceed with cephalosporin use.   Oxycodone Other (See Comments)    Confusion   Clindamycin/Lincomycin Itching   Nalfon [Fenoprofen Calcium] Hives   Robaxin [Methocarbamol] Hives    Patient Measurements: Height: 5\' 8"  (172.7 cm) Weight: 92 kg (202 lb 13.2 oz) IBW/kg (Calculated) : 68.4 Heparin Dosing Weight: 87.4 kg  Vital Signs: Temp: 97.6 F (36.4 C) (12/30 0457) Temp Source: Oral (12/30 0457) BP: 127/59 (12/30 0457) Pulse Rate: 64 (12/30 0457)  Labs: Recent Labs    05/07/23 0612 05/07/23 1633 05/08/23 0115 05/08/23 0442 05/09/23 0451 05/10/23 0314  HGB 9.5*  --   --  9.3* 10.0* 9.9*  HCT 27.8*  --   --  27.2* 28.8* 28.7*  PLT 111*  --   --  112* 106* 107*  HEPARINUNFRC 0.17*   < > 0.45  --  0.40 0.29*  CREATININE 1.61*  --   --  1.54* 1.33*  --   TROPONINIHS  --   --   --  1,897*  --   --    < > = values in this interval not displayed.    Estimated Creatinine Clearance: 49.6 mL/min (A) (by C-G formula based on SCr of 1.33 mg/dL (H)).   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  Assessment: Zayed Bigman is a 71 YOM has a PMH of COPD, T2DM, CKD, HLD, and HTN who presented with the ED for respiratory distress. EMS reports finding him hypoxic with sats in the 70's requiring BiPAP and nasal cannular of 4 L/min. Patient's  troponin level trending up 44 > 2,272 and elevated BNP of 424 evident of an NSTEMI. CT Chest negative for a PE.   Hbg (11.4) and PLT (114) at baseline. Patient's aPTT (31) and PT (14.7) WNL.   12/27 20:06 HL 0.12, subtherapeutic 12/27 0612 HL 0.17, subtherapeutic 12/27 1633 HL 0.43, therapeutic x 1 12/28 0115 HL 0.45, therapeutic x 2 12/29 0451 HL 0.40, therapeutic x 3 12/30 0314 HL 0.29, slightly subtherapeutic  Goal of Therapy:  Heparin level 0.3-0.7 units/ml Monitor platelets by anticoagulation protocol: Yes   Plan:  -Increase heparin infusion at a rate to 1750 units/hr -Recheck HL in 8 hrs after rate change -CBC daily while on heparin  Otelia Sergeant, PharmD, [facility] 05/10/2023 5:13 AM

## 2023-05-11 LAB — CULTURE, BLOOD (ROUTINE X 2)
Culture: NO GROWTH
Culture: NO GROWTH
Special Requests: ADEQUATE
Special Requests: ADEQUATE

## 2023-05-18 ENCOUNTER — Other Ambulatory Visit: Payer: Medicare Other | Admitting: Urology

## 2023-05-19 NOTE — Progress Notes (Signed)
 Established Patient Visit   Chief Complaint: Post Hospital discharge follow-up of CAD  Date of Service: 05/18/2023 Date of Birth: December 23, 1945 PCP: Cleotilde Oneil Novel, MD  History of Present Illness: Christopher Pruitt is a 78 y.o.male patient who presented for posthospital discharge follow-up of CAD.  Past medical history significant for multivessel CAD, interstitial lung disease, diabetes, hyperlipidemia, hypertension, CKD stage III.  He was recently in hospital following sudden onset shortness of breath, thought to be flash pulmonary edema secondary to NSTEMI with significant troponin elevation.  Echo 04/2023 with mildly reduced LVEF of 50% with mild aortic valve stenosis.  Left heart cath 04/2023 with three-vessel coronary artery disease, proximal LAD 40% stenosis, mid LAD 75% stenosis, 80% OM1, 75% distal left circumflex. RCA with 90% stenosis although not a significant size vessel.  LVEF moderately reduced on ventriculogram.  He was evaluated by CTS as outpatient and currently pending CABG.  Today the companied by his wife to visit.  No complaints of chest pain/pressure or increased shortness of breath.  No palpitation, dizziness or syncope.  Labs from yesterday showed significant worsening of renal function with creatinine of 2.8 (baseline around 1.5).  He has seen PCP today who was recommended holding of Lasix  and lisinopril  with plans to recheck BMP next week.  Past Medical and Surgical History  Past Medical History Past Medical History:  Diagnosis Date  . Allergic rhinitis   . COPD (chronic obstructive pulmonary disease) (CMS/HHS-HCC)   . Diabetes mellitus type 2, uncomplicated (CMS/HHS-HCC)    non-insulin  dependent  . History of cataract   . History of kidney stones   . Hx of colonic polyps   . Hyperlipidemia   . Hypothyroidism   . Lymphedema of left leg   . 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 Sleep apnea   . Trigger finger     Past  Surgical History He has a past surgical history that includes Colonoscopy (10/15/2005); Tonsillectomy; Arthroscopic Rotator Cuff Repair; Ingrown toenail excision; Joint replacement (Right, 07/03/2011); Colonoscopy (08/04/2013, 06/20/2009); Colonoscopy (07/27/2018); Endoscopic left carpal tunnel release  (Left, 10/25/2018); Endoscopic right carpal tunnel release  (Right, 02/01/2019); and Hernia repair.   Medications and Allergies  Current Medications  Current Outpatient Medications  Medication Sig Dispense Refill  . acetaminophen  (TYLENOL ) 325 MG tablet Take 650 mg by mouth every 8 (eight) hours as needed    . albuterol  90 mcg/actuation inhaler Inhale 2 inhalations into the lungs every 6 (six) hours as needed 1 each 1  . allopurinoL  (ZYLOPRIM ) 300 MG tablet take 1 tablet by mouth daily 90 tablet 3  . aspirin  81 MG EC tablet Take 81 mg by mouth once daily    . clobetasoL (TEMOVATE) 0.05 % cream Apply topically 2 (two) times daily 30 g 1  . colchicine, gout, (COLCRYS) 0.6 mg tablet Take 2 tablets (1.2mg ) by mouth at first sign of gout flare followed by 1 tablet (0.6mg ) after 1 hour. (Max 1.8mg  within 1 hour) 15 tablet 0  . FLUoxetine  (PROZAC ) 20 MG capsule take 1 capsule by mouth daily 90 capsule 3  . fluticasone propionate (FLONASE NASAL) Place into one nostril once daily as needed    . ipratropium-albuteroL  (DUO-NEB) nebulizer solution Inhale 3 mLs into the lungs once daily as needed    . levothyroxine  (EUTHYROX ) 125 MCG tablet Take 1 tablet (125 mcg total) by mouth once daily Take on an empty stomach with a glass of water at least 30-60 minutes before  breakfast. 90 tablet 3  . metFORMIN (GLUCOPHAGE) 1000 MG tablet Take 1 tablet (1,000 mg total) by mouth once daily 90 tablet 3  . metoprolol  SUCCinate (TOPROL -XL) 25 MG XL tablet Take 1 tablet by mouth once daily    . omeprazole (PRILOSEC) 40 MG DR capsule Take 1 capsule by mouth once daily 90 capsule 0  . ONETOUCH ULTRA TEST test strip USE 1 EACH (1  STRIP TOTAL) ONCE DAILY USE AS INSTRUCTED. 100 strip 3  . oxyBUTYnin  (DITROPAN  XL) 15 MG XL tablet Take 1 tablet by mouth once daily    . rosuvastatin  (CRESTOR ) 20 MG tablet Take 1 tablet (20 mg total) by mouth once daily 90 tablet 3  . FUROsemide  (LASIX ) 20 MG tablet Take 20 mg by mouth once daily (Patient not taking: Reported on 05/18/2023)    . vibegron  (GEMTESA  ORAL) Take by mouth (Patient not taking: Reported on 05/17/2023)     Current Facility-Administered Medications  Medication Dose Route Frequency Provider Last Rate Last Admin  . cyanocobalamin  (VITAMIN B12) injection 1,000 mcg  1,000 mcg Intramuscular Q30 Days Cleotilde Oneil Novel, MD   1,000 mcg at 04/23/23 9085    Allergies: Amoxicillin-pot clavulanate, Nalfon [fenoprofen], Oxycodone, Robaxin [methocarbamol], Clindamycin , and Other  Social and Family History  Social History  reports that he has quit smoking. His smoking use included cigarettes. He has been exposed to tobacco smoke. He has never used smokeless tobacco. He reports that he does not drink alcohol and does not use drugs.  Family History Family History  Problem Relation Name Age of Onset  . Myocardial Infarction (Heart attack) Father    . Diabetes Brother    . Myocardial Infarction (Heart attack) Brother    . Colorectal neoplasia Mother  59  . Lymphoma Sister      Review of Systems   Review of Systems: The patient denies chest pain, shortness of breath, orthopnea, paroxysmal nocturnal dyspnea, pedal edema, palpitations, heart racing, presyncope, syncope.   Physical Examination   Vitals:BP 118/68 (BP Location: Left upper arm, Patient Position: Sitting, BP Cuff Size: Adult)   Pulse 59   Resp 16   Ht 167.6 cm (5' 6)   Wt 85.7 kg (189 lb)   SpO2 98%   BMI 30.51 kg/m  Ht:167.6 cm (5' 6) Wt:85.7 kg (189 lb) ADJ:Anib surface area is 2 meters squared. Body mass index is 30.51 kg/m.  HEENT: Pupils equally reactive to light and accomodation  Neck: Supple, no  significant JVD Lungs: clear to auscultation bilaterally; no wheezes, rales, rhonchi Heart: Regular rate and rhythm. No murmur Extremities: no pedal edema  Assessment and Plan   77 y.o. male with Multivessel CAD Mild cardiomyopathy AKI on CKD Interstitial lung disease Hypertension, hyperlipidemia, diabetes OSA on CPAP  Stable from cardiac standpoint without anginal symptoms, euvolemic on exam.  With significant AKI, I agree with holding of Lasix  and lisinopril  at this time.  Monitor for weight gain or increased shortness of breath. He has tentative plans for CABG in 10 days pending improvement in renal function. Continue current medical therapy with aspirin , statin, metoprolol .  Further titration of medical therapy based on LVEF post CABG Heart healthy diet and regular activity  Orders Placed This Encounter  Procedures  . ECG 12-lead   Return in about 6 weeks (around 06/29/2023).  KRISHNA CHAITANYA ALLURI, MD  This dictation was prepared with dragon dictation. Any transcription errors that result from this process are unintentional.

## 2023-05-20 ENCOUNTER — Ambulatory Visit: Payer: No Typology Code available for payment source | Admitting: Dermatology

## 2023-07-07 ENCOUNTER — Encounter (INDEPENDENT_AMBULATORY_CARE_PROVIDER_SITE_OTHER): Payer: Medicare Other | Admitting: Ophthalmology

## 2023-07-07 ENCOUNTER — Encounter: Payer: Medicare Other | Attending: Neurology

## 2023-07-07 ENCOUNTER — Other Ambulatory Visit: Payer: Self-pay

## 2023-07-07 DIAGNOSIS — Z951 Presence of aortocoronary bypass graft: Secondary | ICD-10-CM

## 2023-07-07 NOTE — Progress Notes (Signed)
 Virtual Visit completed. Patient informed on EP and RD appointment and 6 Minute walk test. Patient also informed of patient health questionnaires on My Chart. Patient Verbalizes understanding. Visit diagnosis can be found in [facility] 06/16/2023.

## 2023-07-12 ENCOUNTER — Encounter: Payer: Medicare Other | Attending: Cardiology

## 2023-07-12 VITALS — Ht 67.0 in | Wt 187.7 lb

## 2023-07-12 DIAGNOSIS — Z951 Presence of aortocoronary bypass graft: Secondary | ICD-10-CM | POA: Insufficient documentation

## 2023-07-12 DIAGNOSIS — I214 Non-ST elevation (NSTEMI) myocardial infarction: Secondary | ICD-10-CM | POA: Diagnosis not present

## 2023-07-12 DIAGNOSIS — Z48812 Encounter for surgical aftercare following surgery on the circulatory system: Secondary | ICD-10-CM | POA: Diagnosis not present

## 2023-07-12 NOTE — Progress Notes (Signed)
 Cardiac Individual Treatment Plan  Patient Details  Name: Christopher Pruitt MRN: [id number] Date of Birth: [birth year] Referring Provider:   Flowsheet Row Cardiac Rehab from 07/12/2023 in [facility] Cardiac and Pulmonary Rehab  Referring Provider Dr. Windell Norfolk, MD       Initial Encounter Date:  Flowsheet Row Cardiac Rehab from 07/12/2023 in [facility] Cardiac and Pulmonary Rehab  Date 07/12/23       Visit Diagnosis: S/P CABG x 3  Patient's Home Medications on Admission:  Current Outpatient Medications:    acetaminophen (TYLENOL) 650 MG CR tablet, Take 650 mg by mouth every 8 (eight) hours as needed for pain., Disp: , Rfl:    albuterol (VENTOLIN HFA) 108 (90 Base) MCG/ACT inhaler, Inhale 2 puffs into the lungs every 6 (six) hours as needed for wheezing or shortness of breath., Disp: 8 g, Rfl: 2   allopurinol (ZYLOPRIM) 300 MG tablet, Take 300 mg by mouth every morning. (Patient not taking: Reported on 07/07/2023), Disp: , Rfl:    amiodarone (PACERONE) 200 MG tablet, Take by mouth., Disp: , Rfl:    aspirin EC 81 MG tablet, Take 1 tablet (81 mg total) by mouth daily. Swallow whole., Disp: 150 tablet, Rfl: 1   atorvastatin (LIPITOR) 40 MG tablet, Take by mouth., Disp: , Rfl:    cyanocobalamin (VITAMIN B12) 1000 MCG/ML injection, Inject into the muscle., Disp: , Rfl:    FLUoxetine (PROZAC) 20 MG capsule, Take by mouth., Disp: , Rfl:    furosemide (LASIX) 20 MG tablet, Take 1 tablet (20 mg total) by mouth daily., Disp: 30 tablet, Rfl: 1   ipratropium-albuterol (DUONEB) 0.5-2.5 (3) MG/3ML SOLN, Take 3 mLs by nebulization every 6 (six) hours as needed (wheezing, shortness of breath). (Patient not taking: Reported on 07/07/2023), Disp: 180 mL, Rfl: 1   levothyroxine (SYNTHROID, LEVOTHROID) 125 MCG tablet, Take 125 mcg by mouth daily before breakfast., Disp: , Rfl:    lisinopril (ZESTRIL) 10 MG tablet, Take 1 tablet (10 mg total) by mouth daily., Disp: 30 tablet, Rfl: 2   metFORMIN (GLUCOPHAGE) 1000 MG  tablet, Take 1,000 mg by mouth daily with breakfast. (Patient not taking: Reported on 07/07/2023), Disp: , Rfl:    metoprolol succinate (TOPROL-XL) 25 MG 24 hr tablet, Take 1 tablet (25 mg total) by mouth daily. (Patient not taking: Reported on 07/07/2023), Disp: 30 tablet, Rfl: 2   metoprolol tartrate (LOPRESSOR) 25 MG tablet, Take by mouth., Disp: , Rfl:    midodrine (PROAMATINE) 5 MG tablet, Take 1 tablet by mouth 2 (two) times daily., Disp: , Rfl:    ONETOUCH ULTRA TEST test strip, USE 1 EACH (1 STRIP TOTAL) ONCE DAILY USE AS INSTRUCTED., Disp: , Rfl:    rosuvastatin (CRESTOR) 20 MG tablet, Take 20 mg by mouth at bedtime. (Patient not taking: Reported on 07/07/2023), Disp: , Rfl:    Vibegron (GEMTESA) 75 MG TABS, Take 1 tablet (75 mg total) by mouth daily. 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, ED&C   Urge urinary incontinence    Wears glasses    Wears hearing aid    BILATERAL    Tobacco Use: Social History   Tobacco Use  Smoking Status Former   Current packs/day: 0.00   Types: Cigarettes   Start date: 1978   Quit date: 1990   Years since quitting: 35.1   Passive exposure: Past  Smokeless Tobacco Never    Labs: Review Flowsheet       Latest Ref Rng & Units 03/26/2021 05/06/2023  Labs for ITP Cardiac and Pulmonary Rehab  Hemoglobin A1c 4.8 - 5.6 % 6.0  5.9      Exercise Target Goals: Exercise Program Goal: Individual  exercise prescription set using results from initial 6 min walk test and THRR while considering  patient's activity barriers and safety.   Exercise Prescription Goal: Initial exercise prescription builds to 30-45 minutes a day of aerobic activity, 2-3 days per week.  Home exercise guidelines will be given to patient during program as part of exercise prescription that the participant will acknowledge.   Education: Aerobic Exercise: - Group verbal and visual presentation on the components of exercise prescription. Introduces F.I.T.T principle from ACSM for exercise prescriptions.  Reviews F.I.T.T. principles of aerobic exercise including progression. Written material given at graduation.   Education: Resistance Exercise: - Group verbal and visual presentation on the components of exercise prescription. Introduces F.I.T.T principle from ACSM for exercise prescriptions  Reviews F.I.T.T. principles of resistance exercise including progression. Written material given at graduation.    Education: Exercise & Equipment Safety: - Individual verbal instruction and demonstration of equipment use and safety with use of the equipment. Flowsheet Row Cardiac Rehab from 07/07/2023 in [facility] Cardiac and Pulmonary Rehab  Date 07/07/23  Educator [facility]  Instruction Review Code 1- Verbalizes Understanding       Education: Exercise Physiology & General Exercise Guidelines: - Group verbal and written instruction with models to review the exercise physiology of the cardiovascular system and associated critical values. Provides general exercise guidelines with specific guidelines to those with heart or lung disease.    Education: Flexibility, Balance, Mind/Body Relaxation: - Group verbal and visual presentation with interactive activity on the components of exercise prescription. Introduces F.I.T.T principle from ACSM for exercise prescriptions. Reviews F.I.T.T. principles of flexibility and balance exercise training  including progression. Also discusses the mind body connection.  Reviews various relaxation techniques to help reduce and manage stress (i.e. Deep breathing, progressive muscle relaxation, and visualization). Balance handout provided to take home. Written material given at graduation.   Activity Barriers & Risk Stratification:  Activity Barriers & Cardiac Risk Stratification - 07/12/23 1554       Activity Barriers & Cardiac Risk Stratification   Activity Barriers Joint Problems;Other (comment)    Comments Hx R shoulder pain, rotator cuff surgery    Cardiac Risk Stratification High             6 Minute Walk:  6 Minute Walk     Row Name 07/12/23 1552         6 Minute Walk   Phase Initial     Distance 960 feet     Walk Time 6 minutes     # of  Rest Breaks 0     MPH 1.82     METS 1.64     RPE 13     Perceived Dyspnea  0     VO2 Peak 5.75     Symptoms No     Resting HR 67 bpm     Resting BP 122/54     Resting Oxygen Saturation  100 %     Exercise Oxygen Saturation  during 6 min walk 96 %     Max Ex. HR 96 bpm     Max Ex. BP 128/56     2 Minute Post BP 120/54              Oxygen Initial Assessment:   Oxygen Re-Evaluation:   Oxygen Discharge (Final Oxygen Re-Evaluation):   Initial Exercise Prescription:  Initial Exercise Prescription - 07/12/23 1500       Date of Initial Exercise RX and Referring Provider   Date 07/12/23    Referring Provider Dr. Windell Norfolk, MD      Oxygen   Maintain Oxygen Saturation 88% or higher      Recumbant Bike   Level 1    RPM 50    Watts 15    Minutes 15    METs 1.64      NuStep   Level 1    SPM 80    Minutes 15    METs 1.64      Track   Laps 16    Minutes 15    METs 1.87      Prescription Details   Frequency (times per week) 3    Duration Progress to 30 minutes of continuous aerobic without signs/symptoms of physical distress      Intensity   THRR 40-80% of Max Heartrate 96-126    Ratings of Perceived  Exertion 11-13    Perceived Dyspnea 0-4      Progression   Progression Continue to progress workloads to maintain intensity without signs/symptoms of physical distress.      Resistance Training   Training Prescription Yes    Weight 4 lb    Reps 10-15             Perform Capillary Blood Glucose checks as needed.  Exercise Prescription Changes:   Exercise Prescription Changes     Row Name 07/12/23 1500             Response to Exercise   Blood Pressure (Admit) 122/54       Blood Pressure (Exercise) 128/56       Blood Pressure (Exit) 120/54       Heart Rate (Admit) 67 bpm       Heart Rate (Exercise) 96 bpm       Heart Rate (Exit) 72 bpm       Oxygen Saturation (Admit) 100 %       Oxygen Saturation (Exercise) 96 %       Rating of Perceived Exertion (Exercise) 13       Perceived Dyspnea (Exercise) 0       Symptoms none       Comments Results                Exercise Comments:   Exercise Goals and Review:   Exercise Goals     Row Name 07/12/23 1555             Exercise Goals   Increase Physical Activity Yes       Intervention Provide  advice, education, support and counseling about physical activity/exercise needs.;Develop an individualized exercise prescription for aerobic and resistive training based on initial evaluation findings, risk stratification, comorbidities and participant's personal goals.       Expected Outcomes Short Term: Attend rehab on a regular basis to increase amount of physical activity.;Long Term: Add in home exercise to make exercise part of routine and to increase amount of physical activity.;Long Term: Exercising regularly at least 3-5 days a week.       Increase Strength and Stamina Yes       Intervention Provide advice, education, support and counseling about physical activity/exercise needs.;Develop an individualized exercise prescription for aerobic and resistive training based on initial evaluation findings, risk  stratification, comorbidities and participant's personal goals.       Expected Outcomes Short Term: Perform resistance training exercises routinely during rehab and add in resistance training at home;Long Term: Improve cardiorespiratory fitness, muscular endurance and strength as measured by increased METs and functional capacity ( );Short Term: Increase workloads from initial exercise prescription for resistance, speed, and METs.       Able to understand and use rate of perceived exertion (RPE) scale Yes       Intervention Provide education and explanation on how to use RPE scale       Expected Outcomes Short Term: Able to use RPE daily in rehab to express subjective intensity level;Long Term:  Able to use RPE to guide intensity level when exercising independently       Able to understand and use Dyspnea scale Yes       Intervention Provide education and explanation on how to use Dyspnea scale       Expected Outcomes Short Term: Able to use Dyspnea scale daily in rehab to express subjective sense of shortness of breath during exertion;Long Term: Able to use Dyspnea scale to guide intensity level when exercising independently       Knowledge and understanding of Target Heart Rate Range (THRR) Yes       Intervention Provide education and explanation of THRR including how the numbers were predicted and where they are located for reference       Expected Outcomes Short Term: Able to state/look up THRR;Long Term: Able to use THRR to govern intensity when exercising independently;Short Term: Able to use daily as guideline for intensity in rehab       Able to check pulse independently Yes       Intervention Provide education and demonstration on how to check pulse in carotid and radial arteries.;Review the importance of being able to check your own pulse for safety during independent exercise       Expected Outcomes Short Term: Able to explain why pulse checking is important during independent  exercise;Long Term: Able to check pulse independently and accurately       Understanding of Exercise Prescription Yes       Intervention Provide education, explanation, and written materials on patient's individual exercise prescription       Expected Outcomes Short Term: Able to explain program exercise prescription;Long Term: Able to explain home exercise prescription to exercise independently                Exercise Goals Re-Evaluation :   Discharge Exercise Prescription (Final Exercise Prescription Changes):  Exercise Prescription Changes - 07/12/23 1500       Response to Exercise   Blood Pressure (Admit) 122/54    Blood Pressure (Exercise) 128/56    Blood Pressure (Exit) 120/54  Heart Rate (Admit) 67 bpm    Heart Rate (Exercise) 96 bpm    Heart Rate (Exit) 72 bpm    Oxygen Saturation (Admit) 100 %    Oxygen Saturation (Exercise) 96 %    Rating of Perceived Exertion (Exercise) 13    Perceived Dyspnea (Exercise) 0    Symptoms none    Comments Results             Nutrition:  Target Goals: Understanding of nutrition guidelines, daily intake of sodium 1500mg , cholesterol 200mg , calories 30% from fat and 7% or less from saturated fats, daily to have 5 or more servings of fruits and vegetables.  Education: All About Nutrition: -Group instruction provided by verbal, written material, interactive activities, discussions, models, and posters to present general guidelines for heart healthy nutrition including fat, fiber, MyPlate, the role of sodium in heart healthy nutrition, utilization of the nutrition label, and utilization of this knowledge for meal planning. Follow up email sent as well. Written material given at graduation.   Biometrics:  Pre Biometrics - 07/12/23 1555       Pre Biometrics   Height 5\' 7"  (1.702 m)    Weight 187 lb 11.2 oz (85.1 kg)    Waist Circumference 41 inches    Hip Circumference 40 inches    Waist to Hip Ratio 1.03 %    BMI  (Calculated) 29.39    Single Leg Stand 3.18 seconds              Nutrition Therapy Plan and Nutrition Goals:  Nutrition Therapy & Goals - 07/12/23 1337       Nutrition Therapy   Diet Car controlled, cardiac, Low Na    Protein (specify units) 80    Fiber 30 grams    Whole Grain Foods 3 servings    Saturated Fats 15 max. grams    Fruits and Vegetables 5 servings/day    Sodium 2 grams      Personal Nutrition Goals   Nutrition Goal Read labels and reduce sodium intake to below 2300mg . Ideally 1500mg  per day.    Personal Goal #2 Reduce saturated fat, less than 12g per day. Replace bad fats for more heart healthy fats.    Personal Goal #3 Eat 15-30gProtein and 30-60gCarbs at each meal.    Comments Patient drinking water and sweet tea sweetened with splenda. Usually eats a large dinner, a small snack for lunch and breakfast usually small. Patient and his wife had questions around types of salts, which is better. Spoke to how all salts are high in sodium and therefore should be limited/avoided when seasoning foods. Reviewed mediterranean diet handout, educated on types of fats, sources, and how to read labels. His wife reads labels often, and tries to keep sodium below 2000mg  per day. She also tries to limit saturated fat. Brainstormed several meals and snacks with foods he likes and will eat focusing on variety while keeping sodium and saturated fat controlled.      Intervention Plan   Intervention Prescribe, educate and counsel regarding individualized specific dietary modifications aiming towards targeted core components such as weight, hypertension, lipid management, diabetes, heart failure and other comorbidities.;Nutrition handout(s) given to patient.    Expected Outcomes Short Term Goal: Understand basic principles of dietary content, such as calories, fat, sodium, cholesterol and nutrients.;Short Term Goal: A plan has been developed with personal nutrition goals set during dietitian  appointment.;Long Term Goal: Adherence to prescribed nutrition plan.  Nutrition Assessments:  MEDIFICTS Score Key: >=70 Need to make dietary changes  40-70 Heart Healthy Diet <= 40 Therapeutic Level Cholesterol Diet   Picture Your Plate Scores: <98 Unhealthy dietary pattern with much room for improvement. 41-50 Dietary pattern unlikely to meet recommendations for good health and room for improvement. 51-60 More healthful dietary pattern, with some room for improvement.  >60 Healthy dietary pattern, although there may be some specific behaviors that could be improved.    Nutrition Goals Re-Evaluation:   Nutrition Goals Discharge (Final Nutrition Goals Re-Evaluation):   Psychosocial: Target Goals: Acknowledge presence or absence of significant depression and/or stress, maximize coping skills, provide positive support system. Participant is able to verbalize types and ability to use techniques and skills needed for reducing stress and depression.   Education: Stress, Anxiety, and Depression - Group verbal and visual presentation to define topics covered.  Reviews how body is impacted by stress, anxiety, and depression.  Also discusses healthy ways to reduce stress and to treat/manage anxiety and depression.  Written material given at graduation.   Education: Sleep Hygiene -Provides group verbal and written instruction about how sleep can affect your health.  Define sleep hygiene, discuss sleep cycles and impact of sleep habits. Review good sleep hygiene tips.    Initial Review & Psychosocial Screening:  Initial Psych Review & Screening - 07/07/23 1434       Initial Review   Current issues with Current Psychotropic Meds;Current Anxiety/Panic      Family Dynamics   Good Support System? Yes    Comments Olie states that everyday life can give him anxiety but since surgery he is able to relax more. His wife and daughter are good for his support.      Barriers    Psychosocial barriers to participate in program There are no identifiable barriers or psychosocial needs.;The patient should benefit from training in stress management and relaxation.      Screening Interventions   Interventions Encouraged to exercise;To provide support and resources with identified psychosocial needs;Provide feedback about the scores to participant    Expected Outcomes Short Term goal: Utilizing psychosocial counselor, staff and physician to assist with identification of specific Stressors or current issues interfering with healing process. Setting desired goal for each stressor or current issue identified.;Long Term Goal: Stressors or current issues are controlled or eliminated.;Short Term goal: Identification and review with participant of any Quality of Life or Depression concerns found by scoring the questionnaire.;Long Term goal: The participant improves quality of Life and PHQ9 Scores as seen by post scores and/or verbalization of changes             Quality of Life Scores:   Scores of 19 and below usually indicate a poorer quality of life in these areas.  A difference of  2-3 points is a clinically meaningful difference.  A difference of 2-3 points in the total score of the Quality of Life Index has been associated with significant improvement in overall quality of life, self-image, physical symptoms, and general health in studies assessing change in quality of life.  PHQ-9: Review Flowsheet       07/12/2023  Depression screen PHQ 2/9  Decreased Interest 0  Down, Depressed, Hopeless 0  PHQ - 2 Score 0  Altered sleeping 0  Tired, decreased energy 1  Change in appetite 1  Feeling bad or failure about yourself  0  Trouble concentrating 0  Moving slowly or fidgety/restless 0  Suicidal thoughts 0  PHQ-9 Score 2  Difficult doing work/chores Not difficult at all   Interpretation of Total Score  Total Score Depression Severity:  1-4 = Minimal depression, 5-9 = Mild  depression, 10-14 = Moderate depression, 15-19 = Moderately severe depression, 20-27 = Severe depression   Psychosocial Evaluation and Intervention:  Psychosocial Evaluation - 07/07/23 1436       Psychosocial Evaluation & Interventions   Interventions Encouraged to exercise with the program and follow exercise prescription;Relaxation education;Stress management education    Comments Baldemar states that everyday life can give him anxiety but since surgery he is able to relax more. His wife and daughter are good for his support.    Expected Outcomes Short: Attend HeartTrack stress management education to decrease stress. Long: Maintain exercise Post HeartTrack to keep stress at a minimum.    Continue Psychosocial Services  Follow up required by staff             Psychosocial Re-Evaluation:   Psychosocial Discharge (Final Psychosocial Re-Evaluation):   Vocational Rehabilitation: Provide vocational rehab assistance to qualifying candidates.   Vocational Rehab Evaluation & Intervention:   Education: Education Goals: Education classes will be provided on a variety of topics geared toward better understanding of heart health and risk factor modification. Participant will state understanding/return demonstration of topics presented as noted by education test scores.  Learning Barriers/Preferences:  Learning Barriers/Preferences - 07/07/23 1434       Learning Barriers/Preferences   Learning Barriers Sight    Learning Preferences None             General Cardiac Education Topics:  AED/CPR: - Group verbal and written instruction with the use of models to demonstrate the basic use of the AED with the basic ABC's of resuscitation.   Anatomy and Cardiac Procedures: - Group verbal and visual presentation and models provide information about basic cardiac anatomy and function. Reviews the testing methods done to diagnose heart disease and the outcomes of the test results.  Describes the treatment choices: Medical Management, Angioplasty, or Coronary Bypass Surgery for treating various heart conditions including Myocardial Infarction, Angina, Valve Disease, and Cardiac Arrhythmias.  Written material given at graduation.   Medication Safety: - Group verbal and visual instruction to review commonly prescribed medications for heart and lung disease. Reviews the medication, class of the drug, and side effects. Includes the steps to properly store meds and maintain the prescription regimen.  Written material given at graduation.   Intimacy: - Group verbal instruction through game format to discuss how heart and lung disease can affect sexual intimacy. Written material given at graduation..   Know Your Numbers and Heart Failure: - Group verbal and visual instruction to discuss disease risk factors for cardiac and pulmonary disease and treatment options.  Reviews associated critical values for Overweight/Obesity, Hypertension, Cholesterol, and Diabetes.  Discusses basics of heart failure: signs/symptoms and treatments.  Introduces Heart Failure Zone chart for action plan for heart failure.  Written material given at graduation.   Infection Prevention: - Provides verbal and written material to individual with discussion of infection control including proper hand washing and proper equipment cleaning during exercise session. Flowsheet Row Cardiac Rehab from 07/07/2023 in [facility] Cardiac and Pulmonary Rehab  Date 07/07/23  Educator [facility]  Instruction Review Code 1- Verbalizes Understanding       Falls Prevention: - Provides verbal and written material to individual with discussion of falls prevention and safety. Flowsheet Row Cardiac Rehab from 07/07/2023 in [facility] Cardiac and Pulmonary Rehab  Date 07/07/23  Educator [facility]  Instruction Review Code 1- Verbalizes Understanding       Other: -Provides group and verbal instruction on various topics (see comments)   Knowledge  Questionnaire Score:   Core Components/Risk Factors/Patient Goals at Admission:  Personal Goals and Risk Factors at Admission - 07/07/23 1433       Core Components/Risk Factors/Patient Goals on Admission    Weight Management Yes    Intervention Weight Management/Obesity: Establish reasonable short term and long term weight goals.;Weight Management: Provide education and appropriate resources to help participant work on and attain dietary goals.;Weight Management: Develop a combined nutrition and exercise program designed to reach desired caloric intake, while maintaining appropriate intake of nutrient and fiber, sodium and fats, and appropriate energy expenditure required for the weight goal.    Expected Outcomes Short Term: Continue to assess and modify interventions until short term weight is achieved;Weight Loss: Understanding of general recommendations for a balanced deficit meal plan, which promotes 1-2 lb weight loss per week and includes a negative energy balance of [phone] kcal/d;Understanding recommendations for meals to include 15-35% energy as protein, 25-35% energy from fat, 35-60% energy from carbohydrates, less than 200mg  of dietary cholesterol, 20-35 gm of total fiber daily;Understanding of distribution of calorie intake throughout the day with the consumption of 4-5 meals/snacks    Diabetes Yes    Intervention Provide education about signs/symptoms and action to take for hypo/hyperglycemia.;Provide education about proper nutrition, including hydration, and aerobic/resistive exercise prescription along with prescribed medications to achieve blood glucose in normal ranges: Fasting glucose 65-99 mg/dL    Expected Outcomes Short Term: Participant verbalizes understanding of the signs/symptoms and immediate care of hyper/hypoglycemia, proper foot care and importance of medication, aerobic/resistive exercise and nutrition plan for blood glucose control.;Long Term: Attainment of HbA1C < 7%.     Heart Failure Yes    Intervention Provide a combined exercise and nutrition program that is supplemented with education, support and counseling about heart failure. Directed toward relieving symptoms such as shortness of breath, decreased exercise tolerance, and extremity edema.    Expected Outcomes Short term: Attendance in program 2-3 days a week with increased exercise capacity. Reported lower sodium intake. Reported increased fruit and vegetable intake. Reports medication compliance.;Improve functional capacity of life;Short term: Daily weights obtained and reported for increase. Utilizing diuretic protocols set by physician.;Long term: Adoption of self-care skills and reduction of barriers for early signs and symptoms recognition and intervention leading to self-care maintenance.    Hypertension Yes    Intervention Provide education on lifestyle modifcations including regular physical activity/exercise, weight management, moderate sodium restriction and increased consumption of fresh fruit, vegetables, and low fat dairy, alcohol moderation, and smoking cessation.;Monitor prescription use compliance.    Expected Outcomes Short Term: Continued assessment and intervention until BP is < 140/71mm HG in hypertensive participants. < 130/66mm HG in hypertensive participants with diabetes, heart failure or chronic kidney disease.;Long Term: Maintenance of blood pressure at goal levels.             Education:Diabetes - Individual verbal and written instruction to review signs/symptoms of diabetes, desired ranges of glucose level fasting, after meals and with exercise. Acknowledge that pre and post exercise glucose checks will be done for 3 sessions at entry of program. Flowsheet Row Cardiac Rehab from 07/07/2023 in [facility] Cardiac and Pulmonary Rehab  Date 07/07/23  Educator [facility]  Instruction Review Code 1- Verbalizes Understanding       Core Components/Risk Factors/Patient Goals Review:    Core  Components/Risk Factors/Patient Goals at  Discharge (Final Review):    ITP Comments:  ITP Comments     Row Name 07/07/23 1433 07/12/23 1548         ITP Comments Virtual Visit completed. Patient informed on EP and RD appointment and 6 Minute walk test. Patient also informed of patient health questionnaires on My Chart. Patient Verbalizes understanding. Visit diagnosis can be found in [facility] 06/16/2023. Completed and gym orientation. Initial ITP created and sent for review to Dr. Bethann Punches, Medical Director.               Comments: Initial ITP

## 2023-07-12 NOTE — Progress Notes (Signed)
 Assessment start time: 11:34 AM  Education r/t nutrition plan Patient drinking water and sweet tea sweetened with splenda. Usually eats a large dinner, a small snack for lunch and breakfast usually small. Patient and his wife had questions around types of salts, which is better. Spoke to how all salts are high in sodium and therefore should be limited/avoided when seasoning foods. Reviewed mediterranean diet handout, educated on types of fats, sources, and how to read labels. His wife reads labels often, and tries to keep sodium below 2000mg  per day. She also tries to limit saturated fat. Brainstormed several meals and snacks with foods he likes and will eat focusing on variety while keeping sodium and saturated fat controlled.      Goal 1: Read labels and reduce sodium intake to below 2300mg . Ideally 1500mg  per day.  Goal 2: Reduce saturated fat, less than 12g per day. Replace bad fats for more heart healthy fats.  Goal 3: Eat 15-30gProtein and 30-60gCarbs at each meal.  End time 12:30 PM

## 2023-07-12 NOTE — Patient Instructions (Signed)
 Patient Instructions  Patient Details  Name: Christopher Pruitt MRN: [id number] Date of Birth: Jun 22, 1945 Referring Provider:  Kathryne Gin, MD  Below are your personal goals for exercise, nutrition, and risk factors. Our goal is to help you stay on track towards obtaining and maintaining these goals. We will be discussing your progress on these goals with you throughout the program.  Initial Exercise Prescription:  Initial Exercise Prescription - 07/12/23 1500       Date of Initial Exercise RX and Referring Provider   Date 07/12/23    Referring Provider Dr. Windell Norfolk, MD      Oxygen   Maintain Oxygen Saturation 88% or higher      Recumbant Bike   Level 1    RPM 50    Watts 15    Minutes 15    METs 1.64      NuStep   Level 1    SPM 80    Minutes 15    METs 1.64      Track   Laps 16    Minutes 15    METs 1.87      Prescription Details   Frequency (times per week) 3    Duration Progress to 30 minutes of continuous aerobic without signs/symptoms of physical distress      Intensity   THRR 40-80% of Max Heartrate 96-126    Ratings of Perceived Exertion 11-13    Perceived Dyspnea 0-4      Progression   Progression Continue to progress workloads to maintain intensity without signs/symptoms of physical distress.      Resistance Training   Training Prescription Yes    Weight 4 lb    Reps 10-15             Exercise Goals: Frequency: Be able to perform aerobic exercise two to three times per week in program working toward 2-5 days per week of home exercise.  Intensity: Work with a perceived exertion of 11 (fairly light) - 15 (hard) while following your exercise prescription.  We will make changes to your prescription with you as you progress through the program.   Duration: Be able to do 30 to 45 minutes of continuous aerobic exercise in addition to a 5 minute warm-up and a 5 minute cool-down routine.   Nutrition Goals: Your personal nutrition goals will  be established when you do your nutrition analysis with the dietician.  The following are general nutrition guidelines to follow: Cholesterol < 200mg /day Sodium < 1500mg /day Fiber: Men over 50 yrs - 30 grams per day  Personal Goals:  Personal Goals and Risk Factors at Admission - 07/07/23 1433       Core Components/Risk Factors/Patient Goals on Admission    Weight Management Yes    Intervention Weight Management/Obesity: Establish reasonable short term and long term weight goals.;Weight Management: Provide education and appropriate resources to help participant work on and attain dietary goals.;Weight Management: Develop a combined nutrition and exercise program designed to reach desired caloric intake, while maintaining appropriate intake of nutrient and fiber, sodium and fats, and appropriate energy expenditure required for the weight goal.    Expected Outcomes Short Term: Continue to assess and modify interventions until short term weight is achieved;Weight Loss: Understanding of general recommendations for a balanced deficit meal plan, which promotes 1-2 lb weight loss per week and includes a negative energy balance of [phone] kcal/d;Understanding recommendations for meals to include 15-35% energy as protein, 25-35% energy from fat, 35-60% energy from  carbohydrates, less than 200mg  of dietary cholesterol, 20-35 gm of total fiber daily;Understanding of distribution of calorie intake throughout the day with the consumption of 4-5 meals/snacks    Diabetes Yes    Intervention Provide education about signs/symptoms and action to take for hypo/hyperglycemia.;Provide education about proper nutrition, including hydration, and aerobic/resistive exercise prescription along with prescribed medications to achieve blood glucose in normal ranges: Fasting glucose 65-99 mg/dL    Expected Outcomes Short Term: Participant verbalizes understanding of the signs/symptoms and immediate care of hyper/hypoglycemia,  proper foot care and importance of medication, aerobic/resistive exercise and nutrition plan for blood glucose control.;Long Term: Attainment of HbA1C < 7%.    Heart Failure Yes    Intervention Provide a combined exercise and nutrition program that is supplemented with education, support and counseling about heart failure. Directed toward relieving symptoms such as shortness of breath, decreased exercise tolerance, and extremity edema.    Expected Outcomes Short term: Attendance in program 2-3 days a week with increased exercise capacity. Reported lower sodium intake. Reported increased fruit and vegetable intake. Reports medication compliance.;Improve functional capacity of life;Short term: Daily weights obtained and reported for increase. Utilizing diuretic protocols set by physician.;Long term: Adoption of self-care skills and reduction of barriers for early signs and symptoms recognition and intervention leading to self-care maintenance.    Hypertension Yes    Intervention Provide education on lifestyle modifcations including regular physical activity/exercise, weight management, moderate sodium restriction and increased consumption of fresh fruit, vegetables, and low fat dairy, alcohol moderation, and smoking cessation.;Monitor prescription use compliance.    Expected Outcomes Short Term: Continued assessment and intervention until BP is < 140/19mm HG in hypertensive participants. < 130/35mm HG in hypertensive participants with diabetes, heart failure or chronic kidney disease.;Long Term: Maintenance of blood pressure at goal levels.            Exercise Goals and Review:  Exercise Goals     Row Name 07/12/23 1555             Exercise Goals   Increase Physical Activity Yes       Intervention Provide advice, education, support and counseling about physical activity/exercise needs.;Develop an individualized exercise prescription for aerobic and resistive training based on initial evaluation  findings, risk stratification, comorbidities and participant's personal goals.       Expected Outcomes Short Term: Attend rehab on a regular basis to increase amount of physical activity.;Long Term: Add in home exercise to make exercise part of routine and to increase amount of physical activity.;Long Term: Exercising regularly at least 3-5 days a week.       Increase Strength and Stamina Yes       Intervention Provide advice, education, support and counseling about physical activity/exercise needs.;Develop an individualized exercise prescription for aerobic and resistive training based on initial evaluation findings, risk stratification, comorbidities and participant's personal goals.       Expected Outcomes Short Term: Perform resistance training exercises routinely during rehab and add in resistance training at home;Long Term: Improve cardiorespiratory fitness, muscular endurance and strength as measured by increased METs and functional capacity ( );Short Term: Increase workloads from initial exercise prescription for resistance, speed, and METs.       Able to understand and use rate of perceived exertion (RPE) scale Yes       Intervention Provide education and explanation on how to use RPE scale       Expected Outcomes Short Term: Able to use RPE daily in rehab to express subjective  intensity level;Long Term:  Able to use RPE to guide intensity level when exercising independently       Able to understand and use Dyspnea scale Yes       Intervention Provide education and explanation on how to use Dyspnea scale       Expected Outcomes Short Term: Able to use Dyspnea scale daily in rehab to express subjective sense of shortness of breath during exertion;Long Term: Able to use Dyspnea scale to guide intensity level when exercising independently       Knowledge and understanding of Target Heart Rate Range (THRR) Yes       Intervention Provide education and explanation of THRR including how the numbers  were predicted and where they are located for reference       Expected Outcomes Short Term: Able to state/look up THRR;Long Term: Able to use THRR to govern intensity when exercising independently;Short Term: Able to use daily as guideline for intensity in rehab       Able to check pulse independently Yes       Intervention Provide education and demonstration on how to check pulse in carotid and radial arteries.;Review the importance of being able to check your own pulse for safety during independent exercise       Expected Outcomes Short Term: Able to explain why pulse checking is important during independent exercise;Long Term: Able to check pulse independently and accurately       Understanding of Exercise Prescription Yes       Intervention Provide education, explanation, and written materials on patient's individual exercise prescription       Expected Outcomes Short Term: Able to explain program exercise prescription;Long Term: Able to explain home exercise prescription to exercise independently

## 2023-07-19 ENCOUNTER — Encounter: Admitting: *Deleted

## 2023-07-19 DIAGNOSIS — Z48812 Encounter for surgical aftercare following surgery on the circulatory system: Secondary | ICD-10-CM | POA: Diagnosis not present

## 2023-07-19 DIAGNOSIS — Z951 Presence of aortocoronary bypass graft: Secondary | ICD-10-CM

## 2023-07-19 LAB — GLUCOSE, CAPILLARY
Glucose-Capillary: 187 mg/dL — ABNORMAL HIGH (ref 70–99)
Glucose-Capillary: 181 mg/dL — ABNORMAL HIGH (ref 70–99)

## 2023-07-19 NOTE — Progress Notes (Signed)
 Daily Session Note  Patient Details  Name: Christopher Pruitt MRN: [id number] Date of Birth: [birth year] Referring Provider:   Flowsheet Row Cardiac Rehab from 07/12/2023 in [facility] Cardiac and Pulmonary Rehab  Referring Provider Dr. Windell Norfolk, MD       Encounter Date: 07/19/2023  Check In:  Session Check In - 07/19/23 1130       Check-In   Supervising physician immediately available to respond to emergencies See telemetry face sheet for immediately available ER MD    Location [facility]-Cardiac & Pulmonary Rehab    Staff Present Cora Collum, RN, BSN, CCRP;Meredith Jewel Baize RN,BSN;Kelly Holland BS, ACSM CEP, Exercise Physiologist;Margaret Best, MS, Exercise Physiologist    Virtual Visit No    Medication changes reported     No    Fall or balance concerns reported    No    Warm-up and Cool-down Performed on first and last piece of equipment    Resistance Training Performed Yes    VAD Patient? No    PAD/SET Patient? No      Pain Assessment   Currently in Pain? No/denies                Social History   Tobacco Use  Smoking Status Former   Current packs/day: 0.00   Types: Cigarettes   Start date: 12   Quit date: 1990   Years since quitting: 35.2   Passive exposure: Past  Smokeless Tobacco Never    Goals Met:  Independence with exercise equipment Exercise tolerated well No report of concerns or symptoms today  Goals Unmet:  Not Applicable  Comments: Pt able to follow exercise prescription today without complaint.  Will continue to monitor for progression.  First full day of exercise!  Patient was oriented to gym and equipment including functions, settings, policies, and procedures.  Patient's individual exercise prescription and treatment plan were reviewed.  All starting workloads were established based on the results of the 6 minute walk test done at initial orientation visit.  The plan for exercise progression was also introduced and progression will be customized  based on patient's performance and goals.   Dr. Bethann Punches is Medical Director for [facility] Cardiac Rehabilitation.  Dr. Vida Rigger is Medical Director for [facility] Pulmonary Rehabilitation.

## 2023-07-21 ENCOUNTER — Encounter: Admitting: *Deleted

## 2023-07-21 DIAGNOSIS — Z48812 Encounter for surgical aftercare following surgery on the circulatory system: Secondary | ICD-10-CM | POA: Diagnosis not present

## 2023-07-21 DIAGNOSIS — Z951 Presence of aortocoronary bypass graft: Secondary | ICD-10-CM

## 2023-07-21 LAB — GLUCOSE, CAPILLARY
Glucose-Capillary: 159 mg/dL — ABNORMAL HIGH (ref 70–99)
Glucose-Capillary: 138 mg/dL — ABNORMAL HIGH (ref 70–99)

## 2023-07-21 NOTE — Progress Notes (Signed)
 Cardiac Individual Treatment Plan  Patient Details  Name: Christopher Pruitt MRN: [id number] Date of Birth: 01/18/1946 Referring Provider:   Flowsheet Row Cardiac Rehab from 07/12/2023 in [facility] Cardiac and Pulmonary Rehab  Referring Provider Dr. Windell Norfolk, MD       Initial Encounter Date:  Flowsheet Row Cardiac Rehab from 07/12/2023 in [facility] Cardiac and Pulmonary Rehab  Date 07/12/23       Visit Diagnosis: S/P CABG x 3  Patient's Home Medications on Admission:  Current Outpatient Medications:    acetaminophen (TYLENOL) 650 MG CR tablet, Take 650 mg by mouth every 8 (eight) hours as needed for pain., Disp: , Rfl:    albuterol (VENTOLIN HFA) 108 (90 Base) MCG/ACT inhaler, Inhale 2 puffs into the lungs every 6 (six) hours as needed for wheezing or shortness of breath., Disp: 8 g, Rfl: 2   allopurinol (ZYLOPRIM) 300 MG tablet, Take 300 mg by mouth every morning. (Patient not taking: Reported on 07/07/2023), Disp: , Rfl:    amiodarone (PACERONE) 200 MG tablet, Take by mouth., Disp: , Rfl:    aspirin EC 81 MG tablet, Take 1 tablet (81 mg total) by mouth daily. Swallow whole., Disp: 150 tablet, Rfl: 1   atorvastatin (LIPITOR) 40 MG tablet, Take by mouth., Disp: , Rfl:    cyanocobalamin (VITAMIN B12) 1000 MCG/ML injection, Inject into the muscle., Disp: , Rfl:    FLUoxetine (PROZAC) 20 MG capsule, Take by mouth., Disp: , Rfl:    furosemide (LASIX) 20 MG tablet, Take 1 tablet (20 mg total) by mouth daily., Disp: 30 tablet, Rfl: 1   ipratropium-albuterol (DUONEB) 0.5-2.5 (3) MG/3ML SOLN, Take 3 mLs by nebulization every 6 (six) hours as needed (wheezing, shortness of breath). (Patient not taking: Reported on 07/07/2023), Disp: 180 mL, Rfl: 1   levothyroxine (SYNTHROID, LEVOTHROID) 125 MCG tablet, Take 125 mcg by mouth daily before breakfast., Disp: , Rfl:    lisinopril (ZESTRIL) 10 MG tablet, Take 1 tablet (10 mg total) by mouth daily., Disp: 30 tablet, Rfl: 2   metFORMIN (GLUCOPHAGE) 1000 MG  tablet, Take 1,000 mg by mouth daily with breakfast. (Patient not taking: Reported on 07/07/2023), Disp: , Rfl:    metoprolol succinate (TOPROL-XL) 25 MG 24 hr tablet, Take 1 tablet (25 mg total) by mouth daily. (Patient not taking: Reported on 07/07/2023), Disp: 30 tablet, Rfl: 2   metoprolol tartrate (LOPRESSOR) 25 MG tablet, Take by mouth., Disp: , Rfl:    midodrine (PROAMATINE) 5 MG tablet, Take 1 tablet by mouth 2 (two) times daily., Disp: , Rfl:    ONETOUCH ULTRA TEST test strip, USE 1 EACH (1 STRIP TOTAL) ONCE DAILY USE AS INSTRUCTED., Disp: , Rfl:    rosuvastatin (CRESTOR) 20 MG tablet, Take 20 mg by mouth at bedtime. (Patient not taking: Reported on 07/07/2023), Disp: , Rfl:    Vibegron (GEMTESA) 75 MG TABS, Take 1 tablet (75 mg total) by mouth daily. 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, ED&C   Urge urinary incontinence    Wears glasses    Wears hearing aid    BILATERAL    Tobacco Use: Social History   Tobacco Use  Smoking Status Former   Current packs/day: 0.00   Types: Cigarettes   Start date: 1978   Quit date: 1990   Years since quitting: 35.2   Passive exposure: Past  Smokeless Tobacco Never    Labs: Review Flowsheet       Latest Ref Rng & Units 03/26/2021 05/06/2023  Labs for ITP Cardiac and Pulmonary Rehab  Hemoglobin A1c 4.8 - 5.6 % 6.0  5.9      Exercise Target Goals: Exercise Program Goal: Individual  exercise prescription set using results from initial 6 min walk test and THRR while considering  patient's activity barriers and safety.   Exercise Prescription Goal: Initial exercise prescription builds to 30-45 minutes a day of aerobic activity, 2-3 days per week.  Home exercise guidelines will be given to patient during program as part of exercise prescription that the participant will acknowledge.   Education: Aerobic Exercise: - Group verbal and visual presentation on the components of exercise prescription. Introduces F.I.T.T principle from ACSM for exercise prescriptions.  Reviews F.I.T.T. principles of aerobic exercise including progression. Written material given at graduation.   Education: Resistance Exercise: - Group verbal and visual presentation on the components of exercise prescription. Introduces F.I.T.T principle from ACSM for exercise prescriptions  Reviews F.I.T.T. principles of resistance exercise including progression. Written material given at graduation.    Education: Exercise & Equipment Safety: - Individual verbal instruction and demonstration of equipment use and safety with use of the equipment. Flowsheet Row Cardiac Rehab from 07/07/2023 in [facility] Cardiac and Pulmonary Rehab  Date 07/07/23  Educator [facility]  Instruction Review Code 1- Verbalizes Understanding       Education: Exercise Physiology & General Exercise Guidelines: - Group verbal and written instruction with models to review the exercise physiology of the cardiovascular system and associated critical values. Provides general exercise guidelines with specific guidelines to those with heart or lung disease.    Education: Flexibility, Balance, Mind/Body Relaxation: - Group verbal and visual presentation with interactive activity on the components of exercise prescription. Introduces F.I.T.T principle from ACSM for exercise prescriptions. Reviews F.I.T.T. principles of flexibility and balance exercise training  including progression. Also discusses the mind body connection.  Reviews various relaxation techniques to help reduce and manage stress (i.e. Deep breathing, progressive muscle relaxation, and visualization). Balance handout provided to take home. Written material given at graduation.   Activity Barriers & Risk Stratification:  Activity Barriers & Cardiac Risk Stratification - 07/12/23 1554       Activity Barriers & Cardiac Risk Stratification   Activity Barriers Joint Problems;Other (comment)    Comments Hx R shoulder pain, rotator cuff surgery    Cardiac Risk Stratification High             6 Minute Walk:  6 Minute Walk     Row Name 07/12/23 1552         6 Minute Walk   Phase Initial     Distance 960 feet     Walk Time 6 minutes     # of  Rest Breaks 0     MPH 1.82     METS 1.64     RPE 13     Perceived Dyspnea  0     VO2 Peak 5.75     Symptoms No     Resting HR 67 bpm     Resting BP 122/54     Resting Oxygen Saturation  100 %     Exercise Oxygen Saturation  during 6 min walk 96 %     Max Ex. HR 96 bpm     Max Ex. BP 128/56     2 Minute Post BP 120/54              Oxygen Initial Assessment:   Oxygen Re-Evaluation:   Oxygen Discharge (Final Oxygen Re-Evaluation):   Initial Exercise Prescription:  Initial Exercise Prescription - 07/12/23 1500       Date of Initial Exercise RX and Referring Provider   Date 07/12/23    Referring Provider Dr. Windell Norfolk, MD      Oxygen   Maintain Oxygen Saturation 88% or higher      Recumbant Bike   Level 1    RPM 50    Watts 15    Minutes 15    METs 1.64      NuStep   Level 1    SPM 80    Minutes 15    METs 1.64      Track   Laps 16    Minutes 15    METs 1.87      Prescription Details   Frequency (times per week) 3    Duration Progress to 30 minutes of continuous aerobic without signs/symptoms of physical distress      Intensity   THRR 40-80% of Max Heartrate 96-126    Ratings of Perceived  Exertion 11-13    Perceived Dyspnea 0-4      Progression   Progression Continue to progress workloads to maintain intensity without signs/symptoms of physical distress.      Resistance Training   Training Prescription Yes    Weight 4 lb    Reps 10-15             Perform Capillary Blood Glucose checks as needed.  Exercise Prescription Changes:   Exercise Prescription Changes     Row Name 07/12/23 1500             Response to Exercise   Blood Pressure (Admit) 122/54       Blood Pressure (Exercise) 128/56       Blood Pressure (Exit) 120/54       Heart Rate (Admit) 67 bpm       Heart Rate (Exercise) 96 bpm       Heart Rate (Exit) 72 bpm       Oxygen Saturation (Admit) 100 %       Oxygen Saturation (Exercise) 96 %       Rating of Perceived Exertion (Exercise) 13       Perceived Dyspnea (Exercise) 0       Symptoms none       Comments Results                Exercise Comments:   Exercise Comments     Row Name 07/19/23 1132           Exercise Comments First full day of exercise!  Patient was oriented to gym and equipment including functions, settings, policies, and procedures.  Patient's individual  exercise prescription and treatment plan were reviewed.  All starting workloads were established based on the results of the 6 minute walk test done at initial orientation visit.  The plan for exercise progression was also introduced and progression will be customized based on patient's performance and goals.                Exercise Goals and Review:   Exercise Goals     Row Name 07/12/23 1555             Exercise Goals   Increase Physical Activity Yes       Intervention Provide advice, education, support and counseling about physical activity/exercise needs.;Develop an individualized exercise prescription for aerobic and resistive training based on initial evaluation findings, risk stratification, comorbidities and participant's personal goals.        Expected Outcomes Short Term: Attend rehab on a regular basis to increase amount of physical activity.;Long Term: Add in home exercise to make exercise part of routine and to increase amount of physical activity.;Long Term: Exercising regularly at least 3-5 days a week.       Increase Strength and Stamina Yes       Intervention Provide advice, education, support and counseling about physical activity/exercise needs.;Develop an individualized exercise prescription for aerobic and resistive training based on initial evaluation findings, risk stratification, comorbidities and participant's personal goals.       Expected Outcomes Short Term: Perform resistance training exercises routinely during rehab and add in resistance training at home;Long Term: Improve cardiorespiratory fitness, muscular endurance and strength as measured by increased METs and functional capacity ( );Short Term: Increase workloads from initial exercise prescription for resistance, speed, and METs.       Able to understand and use rate of perceived exertion (RPE) scale Yes       Intervention Provide education and explanation on how to use RPE scale       Expected Outcomes Short Term: Able to use RPE daily in rehab to express subjective intensity level;Long Term:  Able to use RPE to guide intensity level when exercising independently       Able to understand and use Dyspnea scale Yes       Intervention Provide education and explanation on how to use Dyspnea scale       Expected Outcomes Short Term: Able to use Dyspnea scale daily in rehab to express subjective sense of shortness of breath during exertion;Long Term: Able to use Dyspnea scale to guide intensity level when exercising independently       Knowledge and understanding of Target Heart Rate Range (THRR) Yes       Intervention Provide education and explanation of THRR including how the numbers were predicted and where they are located for reference       Expected Outcomes  Short Term: Able to state/look up THRR;Long Term: Able to use THRR to govern intensity when exercising independently;Short Term: Able to use daily as guideline for intensity in rehab       Able to check pulse independently Yes       Intervention Provide education and demonstration on how to check pulse in carotid and radial arteries.;Review the importance of being able to check your own pulse for safety during independent exercise       Expected Outcomes Short Term: Able to explain why pulse checking is important during independent exercise;Long Term: Able to check pulse independently and accurately       Understanding of Exercise Prescription Yes  Intervention Provide education, explanation, and written materials on patient's individual exercise prescription       Expected Outcomes Short Term: Able to explain program exercise prescription;Long Term: Able to explain home exercise prescription to exercise independently                Exercise Goals Re-Evaluation :  Exercise Goals Re-Evaluation     Row Name 07/19/23 1132             Exercise Goal Re-Evaluation   Exercise Goals Review Able to understand and use rate of perceived exertion (RPE) scale;Able to understand and use Dyspnea scale;Knowledge and understanding of Target Heart Rate Range (THRR);Understanding of Exercise Prescription       Comments Reviewed RPE and dyspnea scale, THR and program prescription with pt today.  Pt voiced understanding and was given a copy of goals to take home.       Expected Outcomes Short: Use RPE daily to regulate intensity. Long: Follow program prescription in THR.                Discharge Exercise Prescription (Final Exercise Prescription Changes):  Exercise Prescription Changes - 07/12/23 1500       Response to Exercise   Blood Pressure (Admit) 122/54    Blood Pressure (Exercise) 128/56    Blood Pressure (Exit) 120/54    Heart Rate (Admit) 67 bpm    Heart Rate (Exercise) 96 bpm     Heart Rate (Exit) 72 bpm    Oxygen Saturation (Admit) 100 %    Oxygen Saturation (Exercise) 96 %    Rating of Perceived Exertion (Exercise) 13    Perceived Dyspnea (Exercise) 0    Symptoms none    Comments Results             Nutrition:  Target Goals: Understanding of nutrition guidelines, daily intake of sodium 1500mg , cholesterol 200mg , calories 30% from fat and 7% or less from saturated fats, daily to have 5 or more servings of fruits and vegetables.  Education: All About Nutrition: -Group instruction provided by verbal, written material, interactive activities, discussions, models, and posters to present general guidelines for heart healthy nutrition including fat, fiber, MyPlate, the role of sodium in heart healthy nutrition, utilization of the nutrition label, and utilization of this knowledge for meal planning. Follow up email sent as well. Written material given at graduation.   Biometrics:  Pre Biometrics - 07/12/23 1555       Pre Biometrics   Height 5\' 7"  (1.702 m)    Weight 187 lb 11.2 oz (85.1 kg)    Waist Circumference 41 inches    Hip Circumference 40 inches    Waist to Hip Ratio 1.03 %    BMI (Calculated) 29.39    Single Leg Stand 3.18 seconds              Nutrition Therapy Plan and Nutrition Goals:  Nutrition Therapy & Goals - 07/12/23 1337       Nutrition Therapy   Diet Car controlled, cardiac, Low Na    Protein (specify units) 80    Fiber 30 grams    Whole Grain Foods 3 servings    Saturated Fats 15 max. grams    Fruits and Vegetables 5 servings/day    Sodium 2 grams      Personal Nutrition Goals   Nutrition Goal Read labels and reduce sodium intake to below 2300mg . Ideally 1500mg  per day.    Personal Goal #2 Reduce saturated fat, less  than 12g per day. Replace bad fats for more heart healthy fats.    Personal Goal #3 Eat 15-30gProtein and 30-60gCarbs at each meal.    Comments Patient drinking water and sweet tea sweetened with  splenda. Usually eats a large dinner, a small snack for lunch and breakfast usually small. Patient and his wife had questions around types of salts, which is better. Spoke to how all salts are high in sodium and therefore should be limited/avoided when seasoning foods. Reviewed mediterranean diet handout, educated on types of fats, sources, and how to read labels. His wife reads labels often, and tries to keep sodium below 2000mg  per day. She also tries to limit saturated fat. Brainstormed several meals and snacks with foods he likes and will eat focusing on variety while keeping sodium and saturated fat controlled.      Intervention Plan   Intervention Prescribe, educate and counsel regarding individualized specific dietary modifications aiming towards targeted core components such as weight, hypertension, lipid management, diabetes, heart failure and other comorbidities.;Nutrition handout(s) given to patient.    Expected Outcomes Short Term Goal: Understand basic principles of dietary content, such as calories, fat, sodium, cholesterol and nutrients.;Short Term Goal: A plan has been developed with personal nutrition goals set during dietitian appointment.;Long Term Goal: Adherence to prescribed nutrition plan.             Nutrition Assessments:  MEDIFICTS Score Key: >=70 Need to make dietary changes  40-70 Heart Healthy Diet <= 40 Therapeutic Level Cholesterol Diet   Picture Your Plate Scores: <04 Unhealthy dietary pattern with much room for improvement. 41-50 Dietary pattern unlikely to meet recommendations for good health and room for improvement. 51-60 More healthful dietary pattern, with some room for improvement.  >60 Healthy dietary pattern, although there may be some specific behaviors that could be improved.    Nutrition Goals Re-Evaluation:   Nutrition Goals Discharge (Final Nutrition Goals Re-Evaluation):   Psychosocial: Target Goals: Acknowledge presence or absence of  significant depression and/or stress, maximize coping skills, provide positive support system. Participant is able to verbalize types and ability to use techniques and skills needed for reducing stress and depression.   Education: Stress, Anxiety, and Depression - Group verbal and visual presentation to define topics covered.  Reviews how body is impacted by stress, anxiety, and depression.  Also discusses healthy ways to reduce stress and to treat/manage anxiety and depression.  Written material given at graduation.   Education: Sleep Hygiene -Provides group verbal and written instruction about how sleep can affect your health.  Define sleep hygiene, discuss sleep cycles and impact of sleep habits. Review good sleep hygiene tips.    Initial Review & Psychosocial Screening:  Initial Psych Review & Screening - 07/07/23 1434       Initial Review   Current issues with Current Psychotropic Meds;Current Anxiety/Panic      Family Dynamics   Good Support System? Yes    Comments Rayhaan states that everyday life can give him anxiety but since surgery he is able to relax more. His wife and daughter are good for his support.      Barriers   Psychosocial barriers to participate in program There are no identifiable barriers or psychosocial needs.;The patient should benefit from training in stress management and relaxation.      Screening Interventions   Interventions Encouraged to exercise;To provide support and resources with identified psychosocial needs;Provide feedback about the scores to participant    Expected Outcomes Short Term goal: Utilizing  psychosocial counselor, staff and physician to assist with identification of specific Stressors or current issues interfering with healing process. Setting desired goal for each stressor or current issue identified.;Long Term Goal: Stressors or current issues are controlled or eliminated.;Short Term goal: Identification and review with participant of any  Quality of Life or Depression concerns found by scoring the questionnaire.;Long Term goal: The participant improves quality of Life and PHQ9 Scores as seen by post scores and/or verbalization of changes             Quality of Life Scores:   Scores of 19 and below usually indicate a poorer quality of life in these areas.  A difference of  2-3 points is a clinically meaningful difference.  A difference of 2-3 points in the total score of the Quality of Life Index has been associated with significant improvement in overall quality of life, self-image, physical symptoms, and general health in studies assessing change in quality of life.  PHQ-9: Review Flowsheet       07/12/2023  Depression screen PHQ 2/9  Decreased Interest 0  Down, Depressed, Hopeless 0  PHQ - 2 Score 0  Altered sleeping 0  Tired, decreased energy 1  Change in appetite 1  Feeling bad or failure about yourself  0  Trouble concentrating 0  Moving slowly or fidgety/restless 0  Suicidal thoughts 0  PHQ-9 Score 2  Difficult doing work/chores Not difficult at all   Interpretation of Total Score  Total Score Depression Severity:  1-4 = Minimal depression, 5-9 = Mild depression, 10-14 = Moderate depression, 15-19 = Moderately severe depression, 20-27 = Severe depression   Psychosocial Evaluation and Intervention:  Psychosocial Evaluation - 07/07/23 1436       Psychosocial Evaluation & Interventions   Interventions Encouraged to exercise with the program and follow exercise prescription;Relaxation education;Stress management education    Comments Anubis states that everyday life can give him anxiety but since surgery he is able to relax more. His wife and daughter are good for his support.    Expected Outcomes Short: Attend HeartTrack stress management education to decrease stress. Long: Maintain exercise Post HeartTrack to keep stress at a minimum.    Continue Psychosocial Services  Follow up required by staff              Psychosocial Re-Evaluation:   Psychosocial Discharge (Final Psychosocial Re-Evaluation):   Vocational Rehabilitation: Provide vocational rehab assistance to qualifying candidates.   Vocational Rehab Evaluation & Intervention:   Education: Education Goals: Education classes will be provided on a variety of topics geared toward better understanding of heart health and risk factor modification. Participant will state understanding/return demonstration of topics presented as noted by education test scores.  Learning Barriers/Preferences:  Learning Barriers/Preferences - 07/07/23 1434       Learning Barriers/Preferences   Learning Barriers Sight    Learning Preferences None             General Cardiac Education Topics:  AED/CPR: - Group verbal and written instruction with the use of models to demonstrate the basic use of the AED with the basic ABC's of resuscitation.   Anatomy and Cardiac Procedures: - Group verbal and visual presentation and models provide information about basic cardiac anatomy and function. Reviews the testing methods done to diagnose heart disease and the outcomes of the test results. Describes the treatment choices: Medical Management, Angioplasty, or Coronary Bypass Surgery for treating various heart conditions including Myocardial Infarction, Angina, Valve Disease, and Cardiac Arrhythmias.  Written material given at graduation.   Medication Safety: - Group verbal and visual instruction to review commonly prescribed medications for heart and lung disease. Reviews the medication, class of the drug, and side effects. Includes the steps to properly store meds and maintain the prescription regimen.  Written material given at graduation.   Intimacy: - Group verbal instruction through game format to discuss how heart and lung disease can affect sexual intimacy. Written material given at graduation..   Know Your Numbers and Heart Failure: - Group  verbal and visual instruction to discuss disease risk factors for cardiac and pulmonary disease and treatment options.  Reviews associated critical values for Overweight/Obesity, Hypertension, Cholesterol, and Diabetes.  Discusses basics of heart failure: signs/symptoms and treatments.  Introduces Heart Failure Zone chart for action plan for heart failure.  Written material given at graduation.   Infection Prevention: - Provides verbal and written material to individual with discussion of infection control including proper hand washing and proper equipment cleaning during exercise session. Flowsheet Row Cardiac Rehab from 07/07/2023 in [facility] Cardiac and Pulmonary Rehab  Date 07/07/23  Educator [facility]  Instruction Review Code 1- Verbalizes Understanding       Falls Prevention: - Provides verbal and written material to individual with discussion of falls prevention and safety. Flowsheet Row Cardiac Rehab from 07/07/2023 in [facility] Cardiac and Pulmonary Rehab  Date 07/07/23  Educator [facility]  Instruction Review Code 1- Verbalizes Understanding       Other: -Provides group and verbal instruction on various topics (see comments)   Knowledge Questionnaire Score:   Core Components/Risk Factors/Patient Goals at Admission:  Personal Goals and Risk Factors at Admission - 07/07/23 1433       Core Components/Risk Factors/Patient Goals on Admission    Weight Management Yes    Intervention Weight Management/Obesity: Establish reasonable short term and long term weight goals.;Weight Management: Provide education and appropriate resources to help participant work on and attain dietary goals.;Weight Management: Develop a combined nutrition and exercise program designed to reach desired caloric intake, while maintaining appropriate intake of nutrient and fiber, sodium and fats, and appropriate energy expenditure required for the weight goal.    Expected Outcomes Short Term: Continue to assess and modify  interventions until short term weight is achieved;Weight Loss: Understanding of general recommendations for a balanced deficit meal plan, which promotes 1-2 lb weight loss per week and includes a negative energy balance of [phone] kcal/d;Understanding recommendations for meals to include 15-35% energy as protein, 25-35% energy from fat, 35-60% energy from carbohydrates, less than 200mg  of dietary cholesterol, 20-35 gm of total fiber daily;Understanding of distribution of calorie intake throughout the day with the consumption of 4-5 meals/snacks    Diabetes Yes    Intervention Provide education about signs/symptoms and action to take for hypo/hyperglycemia.;Provide education about proper nutrition, including hydration, and aerobic/resistive exercise prescription along with prescribed medications to achieve blood glucose in normal ranges: Fasting glucose 65-99 mg/dL    Expected Outcomes Short Term: Participant verbalizes understanding of the signs/symptoms and immediate care of hyper/hypoglycemia, proper foot care and importance of medication, aerobic/resistive exercise and nutrition plan for blood glucose control.;Long Term: Attainment of HbA1C < 7%.    Heart Failure Yes    Intervention Provide a combined exercise and nutrition program that is supplemented with education, support and counseling about heart failure. Directed toward relieving symptoms such as shortness of breath, decreased exercise tolerance, and extremity edema.    Expected Outcomes Short term: Attendance in program 2-3 days a  week with increased exercise capacity. Reported lower sodium intake. Reported increased fruit and vegetable intake. Reports medication compliance.;Improve functional capacity of life;Short term: Daily weights obtained and reported for increase. Utilizing diuretic protocols set by physician.;Long term: Adoption of self-care skills and reduction of barriers for early signs and symptoms recognition and intervention leading  to self-care maintenance.    Hypertension Yes    Intervention Provide education on lifestyle modifcations including regular physical activity/exercise, weight management, moderate sodium restriction and increased consumption of fresh fruit, vegetables, and low fat dairy, alcohol moderation, and smoking cessation.;Monitor prescription use compliance.    Expected Outcomes Short Term: Continued assessment and intervention until BP is < 140/25mm HG in hypertensive participants. < 130/6mm HG in hypertensive participants with diabetes, heart failure or chronic kidney disease.;Long Term: Maintenance of blood pressure at goal levels.             Education:Diabetes - Individual verbal and written instruction to review signs/symptoms of diabetes, desired ranges of glucose level fasting, after meals and with exercise. Acknowledge that pre and post exercise glucose checks will be done for 3 sessions at entry of program. Flowsheet Row Cardiac Rehab from 07/07/2023 in [facility] Cardiac and Pulmonary Rehab  Date 07/07/23  Educator [facility]  Instruction Review Code 1- Verbalizes Understanding       Core Components/Risk Factors/Patient Goals Review:    Core Components/Risk Factors/Patient Goals at Discharge (Final Review):    ITP Comments:  ITP Comments     Row Name 07/07/23 1433 07/12/23 1548 07/19/23 1132 07/21/23 0758     ITP Comments Virtual Visit completed. Patient informed on EP and RD appointment and 6 Minute walk test. Patient also informed of patient health questionnaires on My Chart. Patient Verbalizes understanding. Visit diagnosis can be found in [facility] 06/16/2023. Completed and gym orientation. Initial ITP created and sent for review to Dr. Bethann Punches, Medical Director. First full day of exercise!  Patient was oriented to gym and equipment including functions, settings, policies, and procedures.  Patient's individual exercise prescription and treatment plan were reviewed.  All starting workloads  were established based on the results of the 6 minute walk test done at initial orientation visit.  The plan for exercise progression was also introduced and progression will be customized based on patient's performance and goals. 30 Day review completed. Medical Director ITP review done, changes made as directed, and signed approval by Medical Director. New to program.             Comments: 30 Day review completed. Medical Director ITP review done, changes made as directed, and signed approval by Medical Director. New to program.

## 2023-07-21 NOTE — Progress Notes (Signed)
 Daily Session Note  Patient Details  Name: LADARRIAN ASENCIO MRN: [id number] Date of Birth: 1946-04-28 Referring Provider:   Flowsheet Row Cardiac Rehab from 07/12/2023 in [facility] Cardiac and Pulmonary Rehab  Referring Provider Dr. Windell Norfolk, MD       Encounter Date: 07/21/2023  Check In:  Session Check In - 07/21/23 1142       Check-In   Supervising physician immediately available to respond to emergencies See telemetry face sheet for immediately available ER MD    Location [facility]-Cardiac & Pulmonary Rehab    Staff Present Cora Collum, RN, BSN, CCRP;Noah Tickle, BS, Exercise Physiologist;Maxon Conetta BS, Exercise Physiologist;Joseph Hood RCP,RRT,BSRT    Virtual Visit No    Medication changes reported     No    Fall or balance concerns reported    No    Warm-up and Cool-down Performed on first and last piece of equipment    Resistance Training Performed Yes    VAD Patient? No    PAD/SET Patient? No      Pain Assessment   Currently in Pain? No/denies                Social History   Tobacco Use  Smoking Status Former   Current packs/day: 0.00   Types: Cigarettes   Start date: 28   Quit date: 1990   Years since quitting: 35.2   Passive exposure: Past  Smokeless Tobacco Never    Goals Met:  Independence with exercise equipment Exercise tolerated well No report of concerns or symptoms today  Goals Unmet:  Not Applicable  Comments: Pt able to follow exercise prescription today without complaint.  Will continue to monitor for progression.    Dr. Bethann Punches is Medical Director for [facility] Cardiac Rehabilitation.  Dr. Vida Rigger is Medical Director for [facility] Pulmonary Rehabilitation.

## 2023-07-23 ENCOUNTER — Encounter

## 2023-07-26 ENCOUNTER — Encounter: Admitting: *Deleted

## 2023-07-26 DIAGNOSIS — Z951 Presence of aortocoronary bypass graft: Secondary | ICD-10-CM

## 2023-07-26 DIAGNOSIS — Z48812 Encounter for surgical aftercare following surgery on the circulatory system: Secondary | ICD-10-CM | POA: Diagnosis not present

## 2023-07-26 NOTE — Progress Notes (Signed)
 Daily Session Note  Patient Details  Name: JHALIL SILVERA MRN: [id number] Date of Birth: 1946/03/16 Referring Provider:   Flowsheet Row Cardiac Rehab from 07/12/2023 in [facility] Cardiac and Pulmonary Rehab  Referring Provider Dr. Windell Norfolk, MD       Encounter Date: 07/26/2023  Check In:  Session Check In - 07/26/23 1136       Check-In   Supervising physician immediately available to respond to emergencies See telemetry face sheet for immediately available ER MD    Location [facility]-Cardiac & Pulmonary Rehab    Staff Present Cora Collum, RN, BSN, CCRP;Meredith Jewel Baize RN,BSN;Kelly Kinderhook BS, ACSM CEP, Exercise Physiologist;Margaret Best, MS, Exercise Physiologist    Virtual Visit No    Medication changes reported     No    Fall or balance concerns reported    No    Warm-up and Cool-down Performed on first and last piece of equipment    Resistance Training Performed Yes    VAD Patient? No    PAD/SET Patient? No      Pain Assessment   Currently in Pain? No/denies                Social History   Tobacco Use  Smoking Status Former   Current packs/day: 0.00   Types: Cigarettes   Start date: 53   Quit date: 1990   Years since quitting: 35.2   Passive exposure: Past  Smokeless Tobacco Never    Goals Met:  Independence with exercise equipment Exercise tolerated well No report of concerns or symptoms today  Goals Unmet:  Not Applicable  Comments: Pt able to follow exercise prescription today without complaint.  Will continue to monitor for progression.    Dr. Bethann Punches is Medical Director for [facility] Cardiac Rehabilitation.  Dr. Vida Rigger is Medical Director for [facility] Pulmonary Rehabilitation.

## 2023-07-28 ENCOUNTER — Encounter: Admitting: *Deleted

## 2023-07-28 DIAGNOSIS — Z48812 Encounter for surgical aftercare following surgery on the circulatory system: Secondary | ICD-10-CM | POA: Diagnosis not present

## 2023-07-28 DIAGNOSIS — Z951 Presence of aortocoronary bypass graft: Secondary | ICD-10-CM

## 2023-07-28 NOTE — Progress Notes (Signed)
 Daily Session Note  Patient Details  Name: Christopher Pruitt MRN: [id number] Date of Birth: Oct 11, 1945 Referring Provider:   Flowsheet Row Cardiac Rehab from 07/12/2023 in [facility] Cardiac and Pulmonary Rehab  Referring Provider Dr. Windell Norfolk, MD       Encounter Date: 07/28/2023  Check In:  Session Check In - 07/28/23 1320       Check-In   Supervising physician immediately available to respond to emergencies See telemetry face sheet for immediately available ER MD    Location [facility]-Cardiac & Pulmonary Rehab    Staff Present Maxon Conetta BS, Exercise Physiologist;Noah Tickle, BS, Exercise Physiologist;Icess Bertoni, RN, BSN, CCRP;Joseph Hood RCP,RRT,BSRT    Virtual Visit No    Medication changes reported     No    Fall or balance concerns reported    No    Warm-up and Cool-down Performed on first and last piece of equipment    Resistance Training Performed Yes    VAD Patient? No    PAD/SET Patient? No      Pain Assessment   Currently in Pain? No/denies                Social History   Tobacco Use  Smoking Status Former   Current packs/day: 0.00   Types: Cigarettes   Start date: 20   Quit date: 1990   Years since quitting: 35.2   Passive exposure: Past  Smokeless Tobacco Never    Goals Met:  Independence with exercise equipment Exercise tolerated well No report of concerns or symptoms today  Goals Unmet:  Not Applicable  Comments: Pt able to follow exercise prescription today without complaint.  Will continue to monitor for progression.    Dr. Bethann Punches is Medical Director for [facility] Cardiac Rehabilitation.  Dr. Vida Rigger is Medical Director for [facility] Pulmonary Rehabilitation.

## 2023-07-30 ENCOUNTER — Encounter: Admitting: *Deleted

## 2023-07-30 DIAGNOSIS — Z48812 Encounter for surgical aftercare following surgery on the circulatory system: Secondary | ICD-10-CM | POA: Diagnosis not present

## 2023-07-30 DIAGNOSIS — Z951 Presence of aortocoronary bypass graft: Secondary | ICD-10-CM

## 2023-07-30 NOTE — Progress Notes (Signed)
 Daily Session Note  Patient Details  Name: Christopher Pruitt MRN: [id number] Date of Birth: Dec 14, 1945 Referring Provider:   Flowsheet Row Cardiac Rehab from 07/12/2023 in [facility] Cardiac and Pulmonary Rehab  Referring Provider Dr. Windell Norfolk, MD       Encounter Date: 07/30/2023  Check In:  Session Check In - 07/30/23 1155       Check-In   Supervising physician immediately available to respond to emergencies See telemetry face sheet for immediately available ER MD    Location [facility]-Cardiac & Pulmonary Rehab    Staff Present Rory Percy, MS, Exercise Physiologist;Nicholaos Schippers, RN, BSN, CCRP;Joseph Hood RCP,RRT,BSRT    Virtual Visit No    Medication changes reported     No    Fall or balance concerns reported    No    Warm-up and Cool-down Performed on first and last piece of equipment    Resistance Training Performed Yes    VAD Patient? No    PAD/SET Patient? No      Pain Assessment   Currently in Pain? No/denies                Social History   Tobacco Use  Smoking Status Former   Current packs/day: 0.00   Types: Cigarettes   Start date: 67   Quit date: 1990   Years since quitting: 35.2   Passive exposure: Past  Smokeless Tobacco Never    Goals Met:  Independence with exercise equipment Exercise tolerated well No report of concerns or symptoms today  Goals Unmet:  Not Applicable  Comments: Pt able to follow exercise prescription today without complaint.  Will continue to monitor for progression.    Dr. Bethann Punches is Medical Director for [facility] Cardiac Rehabilitation.  Dr. Vida Rigger is Medical Director for [facility] Pulmonary Rehabilitation.

## 2023-08-02 ENCOUNTER — Encounter

## 2023-08-03 ENCOUNTER — Other Ambulatory Visit: Payer: Self-pay

## 2023-08-04 ENCOUNTER — Ambulatory Visit: Payer: Medicare Other | Admitting: Urology

## 2023-08-04 ENCOUNTER — Encounter: Admitting: *Deleted

## 2023-08-04 DIAGNOSIS — Z951 Presence of aortocoronary bypass graft: Secondary | ICD-10-CM

## 2023-08-04 DIAGNOSIS — Z48812 Encounter for surgical aftercare following surgery on the circulatory system: Secondary | ICD-10-CM | POA: Diagnosis not present

## 2023-08-04 NOTE — Progress Notes (Signed)
 Daily Session Note  Patient Details  Name: NEELY CECENA MRN: [id number] Date of Birth: 1945-12-10 Referring Provider:   Flowsheet Row Cardiac Rehab from 07/12/2023 in [facility] Cardiac and Pulmonary Rehab  Referring Provider Dr. Windell Norfolk, MD       Encounter Date: 08/04/2023  Check In:  Session Check In - 08/04/23 1112       Check-In   Supervising physician immediately available to respond to emergencies See telemetry face sheet for immediately available ER MD    Location [facility]-Cardiac & Pulmonary Rehab    Staff Present Cora Collum, RN, BSN, CCRP;Joseph Hood RCP,RRT,BSRT;Noah Tickle, BS, Exercise Physiologist;Maxon Conetta BS, Exercise Physiologist;Jason Wallace Cullens RDN,LDN    Virtual Visit No    Medication changes reported     No    Fall or balance concerns reported    No    Warm-up and Cool-down Performed on first and last piece of equipment    Resistance Training Performed Yes    VAD Patient? No    PAD/SET Patient? No      Pain Assessment   Currently in Pain? No/denies                Social History   Tobacco Use  Smoking Status Former   Current packs/day: 0.00   Types: Cigarettes   Start date: 37   Quit date: 1990   Years since quitting: 35.2   Passive exposure: Past  Smokeless Tobacco Never    Goals Met:  Independence with exercise equipment Exercise tolerated well No report of concerns or symptoms today  Goals Unmet:  Not Applicable  Comments: Pt able to follow exercise prescription today without complaint.  Will continue to monitor for progression.    Dr. Bethann Punches is Medical Director for [facility] Cardiac Rehabilitation.  Dr. Vida Rigger is Medical Director for [facility] Pulmonary Rehabilitation.

## 2023-08-05 ENCOUNTER — Ambulatory Visit: Payer: Self-pay | Admitting: Urology

## 2023-08-06 ENCOUNTER — Encounter: Admitting: *Deleted

## 2023-08-06 DIAGNOSIS — Z951 Presence of aortocoronary bypass graft: Secondary | ICD-10-CM

## 2023-08-06 DIAGNOSIS — Z48812 Encounter for surgical aftercare following surgery on the circulatory system: Secondary | ICD-10-CM | POA: Diagnosis not present

## 2023-08-06 NOTE — Progress Notes (Signed)
 Daily Session Note  Patient Details  Name: Christopher Pruitt MRN: [id number] Date of Birth: 1946-01-03 Referring Provider:   Flowsheet Row Cardiac Rehab from 07/12/2023 in [facility] Cardiac and Pulmonary Rehab  Referring Provider Dr. Windell Norfolk, MD       Encounter Date: 08/06/2023  Check In:  Session Check In - 08/06/23 1140       Check-In   Supervising physician immediately available to respond to emergencies See telemetry face sheet for immediately available ER MD    Location [facility]-Cardiac & Pulmonary Rehab    Staff Present Cora Collum, RN, BSN, CCRP;Joseph Hood RCP,RRT,BSRT;Noah Tickle, [place], Exercise Physiologist    Virtual Visit No    Medication changes reported     No    Fall or balance concerns reported    No    Warm-up and Cool-down Performed on first and last piece of equipment    Resistance Training Performed Yes    VAD Patient? No    PAD/SET Patient? No      Pain Assessment   Currently in Pain? No/denies                Social History   Tobacco Use  Smoking Status Former   Current packs/day: 0.00   Types: Cigarettes   Start date: 15   Quit date: 1990   Years since quitting: 35.2   Passive exposure: Past  Smokeless Tobacco Never    Goals Met:  Independence with exercise equipment Exercise tolerated well No report of concerns or symptoms today  Goals Unmet:  Not Applicable  Comments: Pt able to follow exercise prescription today without complaint.  Will continue to monitor for progression.    Dr. Bethann Punches is Medical Director for [facility] Cardiac Rehabilitation.  Dr. Vida Rigger is Medical Director for [facility] Pulmonary Rehabilitation.

## 2023-08-09 ENCOUNTER — Encounter: Admitting: *Deleted

## 2023-08-09 DIAGNOSIS — Z951 Presence of aortocoronary bypass graft: Secondary | ICD-10-CM

## 2023-08-09 DIAGNOSIS — Z48812 Encounter for surgical aftercare following surgery on the circulatory system: Secondary | ICD-10-CM | POA: Diagnosis not present

## 2023-08-09 NOTE — Progress Notes (Signed)
 Daily Session Note  Patient Details  Name: Christopher Pruitt MRN: [id number] Date of Birth: Feb 09, 1946 Referring Provider:   Flowsheet Row Cardiac Rehab from 07/12/2023 in [facility] Cardiac and Pulmonary Rehab  Referring Provider Dr. Windell Norfolk, MD       Encounter Date: 08/09/2023  Check In:  Session Check In - 08/09/23 1141       Check-In   Supervising physician immediately available to respond to emergencies See telemetry face sheet for immediately available ER MD    Location [facility]-Cardiac & Pulmonary Rehab    Staff Present Rory Percy, MS, Exercise Physiologist;Cheray Pardi, RN, BSN, CCRP;Maxon Conetta BS, Exercise Physiologist;Kelly Cloretta Ned, ACSM CEP, Exercise Physiologist    Virtual Visit No    Medication changes reported     No    Fall or balance concerns reported    No    Warm-up and Cool-down Performed on first and last piece of equipment    Resistance Training Performed Yes    VAD Patient? No    PAD/SET Patient? No      Pain Assessment   Currently in Pain? No/denies                Social History   Tobacco Use  Smoking Status Former   Current packs/day: 0.00   Types: Cigarettes   Start date: 35   Quit date: 1990   Years since quitting: 35.2   Passive exposure: Past  Smokeless Tobacco Never    Goals Met:  Independence with exercise equipment Exercise tolerated well No report of concerns or symptoms today  Goals Unmet:  Not Applicable  Comments: Pt able to follow exercise prescription today without complaint.  Will continue to monitor for progression.    Dr. Bethann Punches is Medical Director for [facility] Cardiac Rehabilitation.  Dr. Vida Rigger is Medical Director for [facility] Pulmonary Rehabilitation.

## 2023-08-11 ENCOUNTER — Encounter: Attending: Cardiology | Admitting: *Deleted

## 2023-08-11 DIAGNOSIS — Z951 Presence of aortocoronary bypass graft: Secondary | ICD-10-CM | POA: Insufficient documentation

## 2023-08-11 NOTE — Progress Notes (Signed)
 Daily Session Note  Patient Details  Name: Christopher Pruitt MRN: [id number] Date of Birth: 1945/06/05 Referring Provider:   Flowsheet Row Cardiac Rehab from 07/12/2023 in [facility] Cardiac and Pulmonary Rehab  Referring Provider Dr. Windell Norfolk, MD       Encounter Date: 08/11/2023  Check In:  Session Check In - 08/11/23 1121       Check-In   Supervising physician immediately available to respond to emergencies See telemetry face sheet for immediately available ER MD    Staff Present Cora Collum, RN, BSN, CCRP;Joseph Hood RCP,RRT,BSRT;Maxon [place] BS, Exercise Physiologist;Noah Tickle, BS, Exercise Physiologist;Jason Wallace Cullens RDN,LDN    Virtual Visit No    Medication changes reported     No    Fall or balance concerns reported    fell in tub has sore spot on his head    Warm-up and Cool-down Performed on first and last piece of equipment    Resistance Training Performed Yes    VAD Patient? No    PAD/SET Patient? No      Pain Assessment   Currently in Pain? No/denies                Social History   Tobacco Use  Smoking Status Former   Current packs/day: 0.00   Types: Cigarettes   Start date: 49   Quit date: 1990   Years since quitting: 35.2   Passive exposure: Past  Smokeless Tobacco Never    Goals Met:  Independence with exercise equipment Exercise tolerated well No report of concerns or symptoms today  Goals Unmet:  Not Applicable  Comments: Pt able to follow exercise prescription today without complaint.  Will continue to monitor for progression.    Dr. Bethann Punches is Medical Director for [facility] Cardiac Rehabilitation.  Dr. Vida Rigger is Medical Director for [facility] Pulmonary Rehabilitation.

## 2023-08-13 ENCOUNTER — Encounter: Admitting: *Deleted

## 2023-08-13 DIAGNOSIS — Z951 Presence of aortocoronary bypass graft: Secondary | ICD-10-CM

## 2023-08-13 NOTE — Progress Notes (Signed)
 Daily Session Note  Patient Details  Name: Christopher Pruitt MRN: [id number] Date of Birth: [birth year] Referring Provider:   Flowsheet Row Cardiac Rehab from 07/12/2023 in [facility] Cardiac and Pulmonary Rehab  Referring Provider Dr. Windell Norfolk, MD       Encounter Date: 08/13/2023  Check In:  Session Check In - 08/13/23 1108       Check-In   Supervising physician immediately available to respond to emergencies See telemetry face sheet for immediately available ER MD    Location [facility]-Cardiac & Pulmonary Rehab    Staff Present Rory Percy, MS, Exercise Physiologist;Maxon Conetta BS, Exercise Physiologist;Noah Tickle, BS, Exercise Physiologist;Lawanna Cecere, RN, BSN, CCRP    Virtual Visit No    Medication changes reported     No    Fall or balance concerns reported    No    Warm-up and Cool-down Performed on first and last piece of equipment    Resistance Training Performed Yes    VAD Patient? No    PAD/SET Patient? No      Pain Assessment   Currently in Pain? No/denies                Social History   Tobacco Use  Smoking Status Former   Current packs/day: 0.00   Types: Cigarettes   Start date: 46   Quit date: 1990   Years since quitting: 35.2   Passive exposure: Past  Smokeless Tobacco Never    Goals Met:  Independence with exercise equipment Exercise tolerated well No report of concerns or symptoms today  Goals Unmet:  Not Applicable  Comments: Pt able to follow exercise prescription today without complaint.  Will continue to monitor for progression.    Dr. Bethann Punches is Medical Director for [facility] Cardiac Rehabilitation.  Dr. Vida Rigger is Medical Director for [facility] Pulmonary Rehabilitation.

## 2023-08-16 ENCOUNTER — Encounter: Admitting: *Deleted

## 2023-08-16 DIAGNOSIS — Z951 Presence of aortocoronary bypass graft: Secondary | ICD-10-CM | POA: Diagnosis not present

## 2023-08-16 NOTE — Progress Notes (Signed)
 Daily Session Note  Patient Details  Name: Christopher Pruitt MRN: [id number] Date of Birth: 23-Aug-1945 Referring Provider:   Flowsheet Row Cardiac Rehab from 07/12/2023 in [facility] Cardiac and Pulmonary Rehab  Referring Provider Dr. Windell Norfolk, MD       Encounter Date: 08/16/2023  Check In:  Session Check In - 08/16/23 1122       Check-In   Supervising physician immediately available to respond to emergencies See telemetry face sheet for immediately available ER MD    Location [facility]-Cardiac & Pulmonary Rehab    Staff Present Cora Collum, RN, BSN, CCRP;Joseph Hood RCP,RRT,BSRT;Kelly Tenaha BS, ACSM CEP, Exercise Physiologist;Maxon Conetta BS, Exercise Physiologist    Virtual Visit No    Medication changes reported     No    Fall or balance concerns reported    No    Warm-up and Cool-down Performed on first and last piece of equipment    Resistance Training Performed Yes    VAD Patient? No    PAD/SET Patient? No      Pain Assessment   Currently in Pain? No/denies                Social History   Tobacco Use  Smoking Status Former   Current packs/day: 0.00   Types: Cigarettes   Start date: 56   Quit date: 1990   Years since quitting: 35.2   Passive exposure: Past  Smokeless Tobacco Never    Goals Met:  Independence with exercise equipment Exercise tolerated well No report of concerns or symptoms today  Goals Unmet:  Not Applicable  Comments: Pt able to follow exercise prescription today without complaint.  Will continue to monitor for progression.    Dr. Bethann Punches is Medical Director for [facility] Cardiac Rehabilitation.  Dr. Vida Rigger is Medical Director for [facility] Pulmonary Rehabilitation.

## 2023-08-18 ENCOUNTER — Encounter

## 2023-08-18 ENCOUNTER — Encounter: Payer: Self-pay | Admitting: *Deleted

## 2023-08-18 DIAGNOSIS — Z951 Presence of aortocoronary bypass graft: Secondary | ICD-10-CM

## 2023-08-18 NOTE — Progress Notes (Signed)
 Cardiac Individual Treatment Plan  Patient Details  Name: Christopher Pruitt MRN: [id number] Date of Birth: 05-26-1945 Referring Provider:   Flowsheet Row Cardiac Rehab from 07/12/2023 in [facility] Cardiac and Pulmonary Rehab  Referring Provider Dr. Windell Norfolk, MD       Initial Encounter Date:  Flowsheet Row Cardiac Rehab from 07/12/2023 in [facility] Cardiac and Pulmonary Rehab  Date 07/12/23       Visit Diagnosis: S/P CABG x 3  Patient's Home Medications on Admission:  Current Outpatient Medications:    acetaminophen (TYLENOL) 650 MG CR tablet, Take 650 mg by mouth every 8 (eight) hours as needed for pain., Disp: , Rfl:    albuterol (VENTOLIN HFA) 108 (90 Base) MCG/ACT inhaler, Inhale 2 puffs into the lungs every 6 (six) hours as needed for wheezing or shortness of breath., Disp: 8 g, Rfl: 2   allopurinol (ZYLOPRIM) 300 MG tablet, Take 300 mg by mouth every morning. (Patient not taking: Reported on 07/07/2023), Disp: , Rfl:    amiodarone (PACERONE) 200 MG tablet, Take by mouth., Disp: , Rfl:    aspirin EC 81 MG tablet, Take 1 tablet (81 mg total) by mouth daily. Swallow whole., Disp: 150 tablet, Rfl: 1   atorvastatin (LIPITOR) 40 MG tablet, Take by mouth., Disp: , Rfl:    cyanocobalamin (VITAMIN B12) 1000 MCG/ML injection, Inject into the muscle., Disp: , Rfl:    FLUoxetine (PROZAC) 20 MG capsule, Take by mouth., Disp: , Rfl:    furosemide (LASIX) 20 MG tablet, Take 1 tablet (20 mg total) by mouth daily., Disp: 30 tablet, Rfl: 1   ipratropium-albuterol (DUONEB) 0.5-2.5 (3) MG/3ML SOLN, Take 3 mLs by nebulization every 6 (six) hours as needed (wheezing, shortness of breath). (Patient not taking: Reported on 07/07/2023), Disp: 180 mL, Rfl: 1   levothyroxine (SYNTHROID, LEVOTHROID) 125 MCG tablet, Take 125 mcg by mouth daily before breakfast., Disp: , Rfl:    lisinopril (ZESTRIL) 10 MG tablet, Take 1 tablet (10 mg total) by mouth daily., Disp: 30 tablet, Rfl: 2   metFORMIN (GLUCOPHAGE) 1000 MG  tablet, Take 1,000 mg by mouth daily with breakfast. (Patient not taking: Reported on 07/07/2023), Disp: , Rfl:    metoprolol succinate (TOPROL-XL) 25 MG 24 hr tablet, Take 1 tablet (25 mg total) by mouth daily. (Patient not taking: Reported on 07/07/2023), Disp: 30 tablet, Rfl: 2   metoprolol tartrate (LOPRESSOR) 25 MG tablet, Take by mouth., Disp: , Rfl:    midodrine (PROAMATINE) 5 MG tablet, Take 1 tablet by mouth 2 (two) times daily., Disp: , Rfl:    ONETOUCH ULTRA TEST test strip, USE 1 EACH (1 STRIP TOTAL) ONCE DAILY USE AS INSTRUCTED., Disp: , Rfl:    rosuvastatin (CRESTOR) 20 MG tablet, Take 20 mg by mouth at bedtime. (Patient not taking: Reported on 07/07/2023), Disp: , Rfl:    Vibegron (GEMTESA) 75 MG TABS, Take 1 tablet (75 mg total) by mouth daily. 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, ED&C   Urge urinary incontinence    Wears glasses    Wears hearing aid    BILATERAL    Tobacco Use: Social History   Tobacco Use  Smoking Status Former   Current packs/day: 0.00   Types: Cigarettes   Start date: 1978   Quit date: 1990   Years since quitting: 35.2   Passive exposure: Past  Smokeless Tobacco Never    Labs: Review Flowsheet       Latest Ref Rng & Units 03/26/2021 05/06/2023  Labs for ITP Cardiac and Pulmonary Rehab  Hemoglobin A1c 4.8 - 5.6 % 6.0  5.9      Exercise Target Goals: Exercise Program Goal: Individual  exercise prescription set using results from initial 6 min walk test and THRR while considering  patient's activity barriers and safety.   Exercise Prescription Goal: Initial exercise prescription builds to 30-45 minutes a day of aerobic activity, 2-3 days per week.  Home exercise guidelines will be given to patient during program as part of exercise prescription that the participant will acknowledge.   Education: Aerobic Exercise: - Group verbal and visual presentation on the components of exercise prescription. Introduces F.I.T.T principle from ACSM for exercise prescriptions.  Reviews F.I.T.T. principles of aerobic exercise including progression. Written material given at graduation.   Education: Resistance Exercise: - Group verbal and visual presentation on the components of exercise prescription. Introduces F.I.T.T principle from ACSM for exercise prescriptions  Reviews F.I.T.T. principles of resistance exercise including progression. Written material given at graduation.    Education: Exercise & Equipment Safety: - Individual verbal instruction and demonstration of equipment use and safety with use of the equipment. Flowsheet Row Cardiac Rehab from 07/07/2023 in [facility] Cardiac and Pulmonary Rehab  Date 07/07/23  Educator [facility]  Instruction Review Code 1- Verbalizes Understanding       Education: Exercise Physiology & General Exercise Guidelines: - Group verbal and written instruction with models to review the exercise physiology of the cardiovascular system and associated critical values. Provides general exercise guidelines with specific guidelines to those with heart or lung disease.    Education: Flexibility, Balance, Mind/Body Relaxation: - Group verbal and visual presentation with interactive activity on the components of exercise prescription. Introduces F.I.T.T principle from ACSM for exercise prescriptions. Reviews F.I.T.T. principles of flexibility and balance exercise training  including progression. Also discusses the mind body connection.  Reviews various relaxation techniques to help reduce and manage stress (i.e. Deep breathing, progressive muscle relaxation, and visualization). Balance handout provided to take home. Written material given at graduation.   Activity Barriers & Risk Stratification:  Activity Barriers & Cardiac Risk Stratification - 07/12/23 1554       Activity Barriers & Cardiac Risk Stratification   Activity Barriers Joint Problems;Other (comment)    Comments Hx R shoulder pain, rotator cuff surgery    Cardiac Risk Stratification High             6 Minute Walk:  6 Minute Walk     Row Name 07/12/23 1552         6 Minute Walk   Phase Initial     Distance 960 feet     Walk Time 6 minutes     # of  Rest Breaks 0     MPH 1.82     METS 1.64     RPE 13     Perceived Dyspnea  0     VO2 Peak 5.75     Symptoms No     Resting HR 67 bpm     Resting BP 122/54     Resting Oxygen Saturation  100 %     Exercise Oxygen Saturation  during 6 min walk 96 %     Max Ex. HR 96 bpm     Max Ex. BP 128/56     2 Minute Post BP 120/54              Oxygen Initial Assessment:   Oxygen Re-Evaluation:   Oxygen Discharge (Final Oxygen Re-Evaluation):   Initial Exercise Prescription:  Initial Exercise Prescription - 07/12/23 1500       Date of Initial Exercise RX and Referring Provider   Date 07/12/23    Referring Provider Dr. Windell Norfolk, MD      Oxygen   Maintain Oxygen Saturation 88% or higher      Recumbant Bike   Level 1    RPM 50    Watts 15    Minutes 15    METs 1.64      NuStep   Level 1    SPM 80    Minutes 15    METs 1.64      Track   Laps 16    Minutes 15    METs 1.87      Prescription Details   Frequency (times per week) 3    Duration Progress to 30 minutes of continuous aerobic without signs/symptoms of physical distress      Intensity   THRR 40-80% of Max Heartrate 96-126    Ratings of Perceived  Exertion 11-13    Perceived Dyspnea 0-4      Progression   Progression Continue to progress workloads to maintain intensity without signs/symptoms of physical distress.      Resistance Training   Training Prescription Yes    Weight 4 lb    Reps 10-15             Perform Capillary Blood Glucose checks as needed.  Exercise Prescription Changes:   Exercise Prescription Changes     Row Name 07/12/23 1500 07/29/23 1800 08/12/23 0700         Response to Exercise   Blood Pressure (Admit) 122/54 106/60 124/70     Blood Pressure (Exercise) 128/56 140/64 132/70     Blood Pressure (Exit) 120/54 116/50 118/62     Heart Rate (Admit) 67 bpm 65 bpm 55 bpm     Heart Rate (Exercise) 96 bpm 92 bpm 98 bpm     Heart Rate (Exit) 72 bpm 74 bpm 75 bpm     Oxygen Saturation (Admit) 100 % -- --     Oxygen Saturation (Exercise) 96 % -- --     Rating of Perceived Exertion (Exercise) 13 13 14      Perceived Dyspnea (Exercise) 0 -- --     Symptoms none none none     Comments Results first 2 weeks of exericse --     Duration -- Progress to 30 minutes of  aerobic without signs/symptoms of physical distress Progress to 30 minutes of  aerobic without signs/symptoms of physical distress     Intensity -- THRR unchanged THRR unchanged       Progression   Progression --  Continue to progress workloads to maintain intensity without signs/symptoms of physical distress. Continue to progress workloads to maintain intensity without signs/symptoms of physical distress.     Average METs -- 2.79 2.7       Resistance Training   Training Prescription -- Yes Yes     Weight -- 4 lb 4 lb     Reps -- 10-15 10-15       Interval Training   Interval Training -- No No       Recumbant Bike   Level -- 2 2.2     Watts -- 15 15     Minutes -- 15 15     METs -- 2.56 2.57       NuStep   Level -- 3 3     Minutes -- 15 15     METs -- 3 3.4       Track   Laps -- 12 25     Minutes -- 15 15     METs -- 1.65  2.36       Oxygen   Maintain Oxygen Saturation -- 88% or higher 88% or higher              Exercise Comments:   Exercise Comments     Row Name 07/19/23 1132           Exercise Comments First full day of exercise!  Patient was oriented to gym and equipment including functions, settings, policies, and procedures.  Patient's individual exercise prescription and treatment plan were reviewed.  All starting workloads were established based on the results of the 6 minute walk test done at initial orientation visit.  The plan for exercise progression was also introduced and progression will be customized based on patient's performance and goals.                Exercise Goals and Review:   Exercise Goals     Row Name 07/12/23 1555             Exercise Goals   Increase Physical Activity Yes       Intervention Provide advice, education, support and counseling about physical activity/exercise needs.;Develop an individualized exercise prescription for aerobic and resistive training based on initial evaluation findings, risk stratification, comorbidities and participant's personal goals.       Expected Outcomes Short Term: Attend rehab on a regular basis to increase amount of physical activity.;Long Term: Add in home exercise to make exercise part of routine and to increase amount of physical activity.;Long Term: Exercising regularly at least 3-5 days a week.       Increase Strength and Stamina Yes       Intervention Provide advice, education, support and counseling about physical activity/exercise needs.;Develop an individualized exercise prescription for aerobic and resistive training based on initial evaluation findings, risk stratification, comorbidities and participant's personal goals.       Expected Outcomes Short Term: Perform resistance training exercises routinely during rehab and add in resistance training at home;Long Term: Improve cardiorespiratory fitness, muscular  endurance and strength as measured by increased METs and functional capacity ( );Short Term: Increase workloads from initial exercise prescription for resistance, speed, and METs.       Able to understand and use rate of perceived exertion (RPE) scale Yes       Intervention Provide education and explanation on how to use RPE scale       Expected Outcomes Short Term: Able to use RPE daily in rehab to  express subjective intensity level;Long Term:  Able to use RPE to guide intensity level when exercising independently       Able to understand and use Dyspnea scale Yes       Intervention Provide education and explanation on how to use Dyspnea scale       Expected Outcomes Short Term: Able to use Dyspnea scale daily in rehab to express subjective sense of shortness of breath during exertion;Long Term: Able to use Dyspnea scale to guide intensity level when exercising independently       Knowledge and understanding of Target Heart Rate Range (THRR) Yes       Intervention Provide education and explanation of THRR including how the numbers were predicted and where they are located for reference       Expected Outcomes Short Term: Able to state/look up THRR;Long Term: Able to use THRR to govern intensity when exercising independently;Short Term: Able to use daily as guideline for intensity in rehab       Able to check pulse independently Yes       Intervention Provide education and demonstration on how to check pulse in carotid and radial arteries.;Review the importance of being able to check your own pulse for safety during independent exercise       Expected Outcomes Short Term: Able to explain why pulse checking is important during independent exercise;Long Term: Able to check pulse independently and accurately       Understanding of Exercise Prescription Yes       Intervention Provide education, explanation, and written materials on patient's individual exercise prescription       Expected Outcomes  Short Term: Able to explain program exercise prescription;Long Term: Able to explain home exercise prescription to exercise independently                Exercise Goals Re-Evaluation :  Exercise Goals Re-Evaluation     Row Name 07/19/23 1132 07/29/23 1852 08/12/23 0731         Exercise Goal Re-Evaluation   Exercise Goals Review Able to understand and use rate of perceived exertion (RPE) scale;Able to understand and use Dyspnea scale;Knowledge and understanding of Target Heart Rate Range (THRR);Understanding of Exercise Prescription Increase Physical Activity;Increase Strength and Stamina;Understanding of Exercise Prescription Increase Physical Activity;Increase Strength and Stamina;Understanding of Exercise Prescription     Comments Reviewed RPE and dyspnea scale, THR and program prescription with pt today.  Pt voiced understanding and was given a copy of goals to take home. Christopher Pruitt is off to a great start in the program. He was able to increase his level on the T5 nustep from 1 to 3. He was also able to increase to level 2 on the recumbent bike. We will continue to monitor his progress in the program. Christopher Pruitt is doing well in rehab. He recently increased to 25 laps walked on the track. He also improved to level 2.2 on the recumbent bike and continues to work at level 3 on the T4 nustep. We will continue to monitor his progress in the program.     Expected Outcomes Short: Use RPE daily to regulate intensity. Long: Follow program prescription in THR. Short: Continue to follow exercise prescription. Long: Continue exercise to improve strength and stamina. Short: Continue to push for more laps on the track. Long: Continue exercise to improve strength and stamina.              Discharge Exercise Prescription (Final Exercise Prescription Changes):  Exercise Prescription Changes -  08/12/23 0700       Response to Exercise   Blood Pressure (Admit) 124/70    Blood Pressure (Exercise) 132/70     Blood Pressure (Exit) 118/62    Heart Rate (Admit) 55 bpm    Heart Rate (Exercise) 98 bpm    Heart Rate (Exit) 75 bpm    Rating of Perceived Exertion (Exercise) 14    Symptoms none    Duration Progress to 30 minutes of  aerobic without signs/symptoms of physical distress    Intensity THRR unchanged      Progression   Progression Continue to progress workloads to maintain intensity without signs/symptoms of physical distress.    Average METs 2.7      Resistance Training   Training Prescription Yes    Weight 4 lb    Reps 10-15      Interval Training   Interval Training No      Recumbant Bike   Level 2.2    Watts 15    Minutes 15    METs 2.57      NuStep   Level 3    Minutes 15    METs 3.4      Track   Laps 25    Minutes 15    METs 2.36      Oxygen   Maintain Oxygen Saturation 88% or higher             Nutrition:  Target Goals: Understanding of nutrition guidelines, daily intake of sodium 1500mg , cholesterol 200mg , calories 30% from fat and 7% or less from saturated fats, daily to have 5 or more servings of fruits and vegetables.  Education: All About Nutrition: -Group instruction provided by verbal, written material, interactive activities, discussions, models, and posters to present general guidelines for heart healthy nutrition including fat, fiber, MyPlate, the role of sodium in heart healthy nutrition, utilization of the nutrition label, and utilization of this knowledge for meal planning. Follow up email sent as well. Written material given at graduation.   Biometrics:  Pre Biometrics - 07/12/23 1555       Pre Biometrics   Height 5\' 7"  (1.702 m)    Weight 187 lb 11.2 oz (85.1 kg)    Waist Circumference 41 inches    Hip Circumference 40 inches    Waist to Hip Ratio 1.03 %    BMI (Calculated) 29.39    Single Leg Stand 3.18 seconds              Nutrition Therapy Plan and Nutrition Goals:  Nutrition Therapy & Goals - 07/12/23 1337        Nutrition Therapy   Diet Car controlled, cardiac, Low Na    Protein (specify units) 80    Fiber 30 grams    Whole Grain Foods 3 servings    Saturated Fats 15 max. grams    Fruits and Vegetables 5 servings/day    Sodium 2 grams      Personal Nutrition Goals   Nutrition Goal Read labels and reduce sodium intake to below 2300mg . Ideally 1500mg  per day.    Personal Goal #2 Reduce saturated fat, less than 12g per day. Replace bad fats for more heart healthy fats.    Personal Goal #3 Eat 15-30gProtein and 30-60gCarbs at each meal.    Comments Patient drinking water and sweet tea sweetened with splenda. Usually eats a large dinner, a small snack for lunch and breakfast usually small. Patient and his wife had questions around types of salts,  which is better. Spoke to how all salts are high in sodium and therefore should be limited/avoided when seasoning foods. Reviewed mediterranean diet handout, educated on types of fats, sources, and how to read labels. His wife reads labels often, and tries to keep sodium below 2000mg  per day. She also tries to limit saturated fat. Brainstormed several meals and snacks with foods he likes and will eat focusing on variety while keeping sodium and saturated fat controlled.      Intervention Plan   Intervention Prescribe, educate and counsel regarding individualized specific dietary modifications aiming towards targeted core components such as weight, hypertension, lipid management, diabetes, heart failure and other comorbidities.;Nutrition handout(s) given to patient.    Expected Outcomes Short Term Goal: Understand basic principles of dietary content, such as calories, fat, sodium, cholesterol and nutrients.;Short Term Goal: A plan has been developed with personal nutrition goals set during dietitian appointment.;Long Term Goal: Adherence to prescribed nutrition plan.             Nutrition Assessments:  MEDIFICTS Score Key: >=70 Need to make dietary changes   40-70 Heart Healthy Diet <= 40 Therapeutic Level Cholesterol Diet  Flowsheet Row Cardiac Rehab from 07/21/2023 in [facility] Cardiac and Pulmonary Rehab  Picture Your Plate Total Score on Admission 72      Picture Your Plate Scores: <16 Unhealthy dietary pattern with much room for improvement. 41-50 Dietary pattern unlikely to meet recommendations for good health and room for improvement. 51-60 More healthful dietary pattern, with some room for improvement.  >60 Healthy dietary pattern, although there may be some specific behaviors that could be improved.    Nutrition Goals Re-Evaluation:  Nutrition Goals Re-Evaluation     Row Name 08/16/23 1151             Goals   Nutrition Goal Read labels and reduce sodium intake to below 2300mg . Ideally 1500mg  per day.       Comment Christopher Pruitt has issues with not being able to taste well so it is sometimes hard to eat. He reports that his wife keeps up with what he is eating and reads food labels and watches his sodium consumption very closely. He has good support from his wife with his nutrition needs.       Expected Outcome Short: continue to monitor sodium intake. Long: maintian heart healthy diet to help control cardiac risk factors.                Nutrition Goals Discharge (Final Nutrition Goals Re-Evaluation):  Nutrition Goals Re-Evaluation - 08/16/23 1151       Goals   Nutrition Goal Read labels and reduce sodium intake to below 2300mg . Ideally 1500mg  per day.    Comment Christopher Pruitt has issues with not being able to taste well so it is sometimes hard to eat. He reports that his wife keeps up with what he is eating and reads food labels and watches his sodium consumption very closely. He has good support from his wife with his nutrition needs.    Expected Outcome Short: continue to monitor sodium intake. Long: maintian heart healthy diet to help control cardiac risk factors.             Psychosocial: Target Goals: Acknowledge presence or  absence of significant depression and/or stress, maximize coping skills, provide positive support system. Participant is able to verbalize types and ability to use techniques and skills needed for reducing stress and depression.   Education: Stress, Anxiety, and Depression - Group verbal  and visual presentation to define topics covered.  Reviews how body is impacted by stress, anxiety, and depression.  Also discusses healthy ways to reduce stress and to treat/manage anxiety and depression.  Written material given at graduation.   Education: Sleep Hygiene -Provides group verbal and written instruction about how sleep can affect your health.  Define sleep hygiene, discuss sleep cycles and impact of sleep habits. Review good sleep hygiene tips.    Initial Review & Psychosocial Screening:  Initial Psych Review & Screening - 07/07/23 1434       Initial Review   Current issues with Current Psychotropic Meds;Current Anxiety/Panic      Family Dynamics   Good Support System? Yes    Comments Anthem states that everyday life can give him anxiety but since surgery he is able to relax more. His wife and daughter are good for his support.      Barriers   Psychosocial barriers to participate in program There are no identifiable barriers or psychosocial needs.;The patient should benefit from training in stress management and relaxation.      Screening Interventions   Interventions Encouraged to exercise;To provide support and resources with identified psychosocial needs;Provide feedback about the scores to participant    Expected Outcomes Short Term goal: Utilizing psychosocial counselor, staff and physician to assist with identification of specific Stressors or current issues interfering with healing process. Setting desired goal for each stressor or current issue identified.;Long Term Goal: Stressors or current issues are controlled or eliminated.;Short Term goal: Identification and review with  participant of any Quality of Life or Depression concerns found by scoring the questionnaire.;Long Term goal: The participant improves quality of Life and PHQ9 Scores as seen by post scores and/or verbalization of changes             Quality of Life Scores:   Quality of Life - 07/21/23 1801       Quality of Life   Select Quality of Life      Quality of Life Scores   Health/Function Pre 24.19 %    Socioeconomic Pre 27.93 %    Psych/Spiritual Pre 26.64 %    Family Pre 15.5 %    GLOBAL Pre 24.19 %            Scores of 19 and below usually indicate a poorer quality of life in these areas.  A difference of  2-3 points is a clinically meaningful difference.  A difference of 2-3 points in the total score of the Quality of Life Index has been associated with significant improvement in overall quality of life, self-image, physical symptoms, and general health in studies assessing change in quality of life.  PHQ-9: Review Flowsheet       07/12/2023  Depression screen PHQ 2/9  Decreased Interest 0  Down, Depressed, Hopeless 0  PHQ - 2 Score 0  Altered sleeping 0  Tired, decreased energy 1  Change in appetite 1  Feeling bad or failure about yourself  0  Trouble concentrating 0  Moving slowly or fidgety/restless 0  Suicidal thoughts 0  PHQ-9 Score 2  Difficult doing work/chores Not difficult at all   Interpretation of Total Score  Total Score Depression Severity:  1-4 = Minimal depression, 5-9 = Mild depression, 10-14 = Moderate depression, 15-19 = Moderately severe depression, 20-27 = Severe depression   Psychosocial Evaluation and Intervention:  Psychosocial Evaluation - 07/07/23 1436       Psychosocial Evaluation & Interventions   Interventions Encouraged to  exercise with the program and follow exercise prescription;Relaxation education;Stress management education    Comments Christopher Pruitt states that everyday life can give him anxiety but since surgery he is able to relax more.  His wife and daughter are good for his support.    Expected Outcomes Short: Attend [facility] stress management education to decrease stress. Long: Maintain exercise Post [facility] to keep stress at a minimum.    Continue Psychosocial Services  Follow up required by staff             Psychosocial Re-Evaluation:  Psychosocial Re-Evaluation     Row Name 08/16/23 1153             Psychosocial Re-Evaluation   Current issues with Current Psychotropic Meds;Current Anxiety/Panic       Comments Christopher Pruitt reports that he has no changes or concerns currently with sleep, stress, or mental health. All of these areas are pretty steady and he has good support from his wife.       Expected Outcomes Short: continue to attend cardiac rehab to exercise for mental health benefits. Long: maintain good mental health routine and notify doctor of any changes or concerns.       Interventions Encouraged to attend Cardiac Rehabilitation for the exercise       Continue Psychosocial Services  Follow up required by staff                Psychosocial Discharge (Final Psychosocial Re-Evaluation):  Psychosocial Re-Evaluation - 08/16/23 1153       Psychosocial Re-Evaluation   Current issues with Current Psychotropic Meds;Current Anxiety/Panic    Comments Christopher Pruitt reports that he has no changes or concerns currently with sleep, stress, or mental health. All of these areas are pretty steady and he has good support from his wife.    Expected Outcomes Short: continue to attend cardiac rehab to exercise for mental health benefits. Long: maintain good mental health routine and notify doctor of any changes or concerns.    Interventions Encouraged to attend Cardiac Rehabilitation for the exercise    Continue Psychosocial Services  Follow up required by staff             Vocational Rehabilitation: Provide vocational rehab assistance to qualifying candidates.   Vocational Rehab Evaluation &  Intervention:   Education: Education Goals: Education classes will be provided on a variety of topics geared toward better understanding of heart health and risk factor modification. Participant will state understanding/return demonstration of topics presented as noted by education test scores.  Learning Barriers/Preferences:  Learning Barriers/Preferences - 07/07/23 1434       Learning Barriers/Preferences   Learning Barriers Sight    Learning Preferences None             General Cardiac Education Topics:  AED/CPR: - Group verbal and written instruction with the use of models to demonstrate the basic use of the AED with the basic ABC's of resuscitation.   Anatomy and Cardiac Procedures: - Group verbal and visual presentation and models provide information about basic cardiac anatomy and function. Reviews the testing methods done to diagnose heart disease and the outcomes of the test results. Describes the treatment choices: Medical Management, Angioplasty, or Coronary Bypass Surgery for treating various heart conditions including Myocardial Infarction, Angina, Valve Disease, and Cardiac Arrhythmias.  Written material given at graduation.   Medication Safety: - Group verbal and visual instruction to review commonly prescribed medications for heart and lung disease. Reviews the medication, class of the drug, and  side effects. Includes the steps to properly store meds and maintain the prescription regimen.  Written material given at graduation.   Intimacy: - Group verbal instruction through game format to discuss how heart and lung disease can affect sexual intimacy. Written material given at graduation..   Know Your Numbers and Heart Failure: - Group verbal and visual instruction to discuss disease risk factors for cardiac and pulmonary disease and treatment options.  Reviews associated critical values for Overweight/Obesity, Hypertension, Cholesterol, and Diabetes.  Discusses  basics of heart failure: signs/symptoms and treatments.  Introduces Heart Failure Zone chart for action plan for heart failure.  Written material given at graduation.   Infection Prevention: - Provides verbal and written material to individual with discussion of infection control including proper hand washing and proper equipment cleaning during exercise session. Flowsheet Row Cardiac Rehab from 07/07/2023 in [facility] Cardiac and Pulmonary Rehab  Date 07/07/23  Educator [facility]  Instruction Review Code 1- Verbalizes Understanding       Falls Prevention: - Provides verbal and written material to individual with discussion of falls prevention and safety. Flowsheet Row Cardiac Rehab from 07/07/2023 in [facility] Cardiac and Pulmonary Rehab  Date 07/07/23  Educator [facility]  Instruction Review Code 1- Verbalizes Understanding       Other: -Provides group and verbal instruction on various topics (see comments)   Knowledge Questionnaire Score:  Knowledge Questionnaire Score - 07/21/23 1801       Knowledge Questionnaire Score   Pre Score 21/26             Core Components/Risk Factors/Patient Goals at Admission:  Personal Goals and Risk Factors at Admission - 07/07/23 1433       Core Components/Risk Factors/Patient Goals on Admission    Weight Management Yes    Intervention Weight Management/Obesity: Establish reasonable short term and long term weight goals.;Weight Management: Provide education and appropriate resources to help participant work on and attain dietary goals.;Weight Management: Develop a combined nutrition and exercise program designed to reach desired caloric intake, while maintaining appropriate intake of nutrient and fiber, sodium and fats, and appropriate energy expenditure required for the weight goal.    Expected Outcomes Short Term: Continue to assess and modify interventions until short term weight is achieved;Weight Loss: Understanding of general recommendations for a balanced  deficit meal plan, which promotes 1-2 lb weight loss per week and includes a negative energy balance of [phone] kcal/d;Understanding recommendations for meals to include 15-35% energy as protein, 25-35% energy from fat, 35-60% energy from carbohydrates, less than 200mg  of dietary cholesterol, 20-35 gm of total fiber daily;Understanding of distribution of calorie intake throughout the day with the consumption of 4-5 meals/snacks    Diabetes Yes    Intervention Provide education about signs/symptoms and action to take for hypo/hyperglycemia.;Provide education about proper nutrition, including hydration, and aerobic/resistive exercise prescription along with prescribed medications to achieve blood glucose in normal ranges: Fasting glucose 65-99 mg/dL    Expected Outcomes Short Term: Participant verbalizes understanding of the signs/symptoms and immediate care of hyper/hypoglycemia, proper foot care and importance of medication, aerobic/resistive exercise and nutrition plan for blood glucose control.;Long Term: Attainment of HbA1C < 7%.    Heart Failure Yes    Intervention Provide a combined exercise and nutrition program that is supplemented with education, support and counseling about heart failure. Directed toward relieving symptoms such as shortness of breath, decreased exercise tolerance, and extremity edema.    Expected Outcomes Short term: Attendance in program 2-3 days a week with  increased exercise capacity. Reported lower sodium intake. Reported increased fruit and vegetable intake. Reports medication compliance.;Improve functional capacity of life;Short term: Daily weights obtained and reported for increase. Utilizing diuretic protocols set by physician.;Long term: Adoption of self-care skills and reduction of barriers for early signs and symptoms recognition and intervention leading to self-care maintenance.    Hypertension Yes    Intervention Provide education on lifestyle modifcations including  regular physical activity/exercise, weight management, moderate sodium restriction and increased consumption of fresh fruit, vegetables, and low fat dairy, alcohol moderation, and smoking cessation.;Monitor prescription use compliance.    Expected Outcomes Short Term: Continued assessment and intervention until BP is < 140/39mm HG in hypertensive participants. < 130/24mm HG in hypertensive participants with diabetes, heart failure or chronic kidney disease.;Long Term: Maintenance of blood pressure at goal levels.             Education:Diabetes - Individual verbal and written instruction to review signs/symptoms of diabetes, desired ranges of glucose level fasting, after meals and with exercise. Acknowledge that pre and post exercise glucose checks will be done for 3 sessions at entry of program. Flowsheet Row Cardiac Rehab from 07/07/2023 in [facility] Cardiac and Pulmonary Rehab  Date 07/07/23  Educator [facility]  Instruction Review Code 1- Verbalizes Understanding       Core Components/Risk Factors/Patient Goals Review:   Goals and Risk Factor Review     Row Name 08/16/23 1149             Core Components/Risk Factors/Patient Goals Review   Personal Goals Review Diabetes;Heart Failure;Hypertension       Review Tarance reports that he has a good morning routine of monitoring weight, blood pressure, and blood sugar. He is very consistent with monitoring and takes all his meds as prescirbed by his doctors. He reports no concerns with his levels in these areas currently but will continue to monitor and report any changes or concerns to his doctor.       Expected Outcomes Short: continue routine of monitoring BP, blood sugar, and weight. Long: control cardiac risk factors long term.                Core Components/Risk Factors/Patient Goals at Discharge (Final Review):   Goals and Risk Factor Review - 08/16/23 1149       Core Components/Risk Factors/Patient Goals Review   Personal Goals Review  Diabetes;Heart Failure;Hypertension    Review Jianni reports that he has a good morning routine of monitoring weight, blood pressure, and blood sugar. He is very consistent with monitoring and takes all his meds as prescirbed by his doctors. He reports no concerns with his levels in these areas currently but will continue to monitor and report any changes or concerns to his doctor.    Expected Outcomes Short: continue routine of monitoring BP, blood sugar, and weight. Long: control cardiac risk factors long term.             ITP Comments:  ITP Comments     Row Name 07/07/23 1433 07/12/23 1548 07/19/23 1132 07/21/23 0758 08/18/23 1207   ITP Comments Virtual Visit completed. Patient informed on EP and RD appointment and 6 Minute walk test. Patient also informed of patient health questionnaires on My Chart. Patient Verbalizes understanding. Visit diagnosis can be found in [facility] 06/16/2023. Completed and gym orientation. Initial ITP created and sent for review to Dr. Bethann Punches, Medical Director. First full day of exercise!  Patient was oriented to gym and equipment including functions,  settings, policies, and procedures.  Patient's individual exercise prescription and treatment plan were reviewed.  All starting workloads were established based on the results of the 6 minute walk test done at initial orientation visit.  The plan for exercise progression was also introduced and progression will be customized based on patient's performance and goals. 30 Day review completed. Medical Director ITP review done, changes made as directed, and signed approval by Medical Director. New to program. 30 Day review completed. Medical Director ITP review done, changes made as directed, and signed approval by Medical Director.            Comments:

## 2023-08-18 NOTE — Progress Notes (Signed)
 Daily Session Note  Patient Details  Name: Christopher Pruitt MRN: [id number] Date of Birth: 05-30-1945 Referring Provider:   Flowsheet Row Cardiac Rehab from 07/12/2023 in [facility] Cardiac and Pulmonary Rehab  Referring Provider Dr. Windell Norfolk, MD       Encounter Date: 08/18/2023  Check In:  Session Check In - 08/18/23 1108       Check-In   Supervising physician immediately available to respond to emergencies See telemetry face sheet for immediately available ER MD    Location [facility]-Cardiac & Pulmonary Rehab    Staff Present Susann Givens RN,BSN;Margaret Best, MS, Exercise Physiologist;Miriya Cloer RN,BSN,MPA;Joseph Hood RCP,RRT,BSRT    Virtual Visit No    Medication changes reported     No    Fall or balance concerns reported    No    Warm-up and Cool-down Performed on first and last piece of equipment    Resistance Training Performed Yes    VAD Patient? No    PAD/SET Patient? No      Pain Assessment   Currently in Pain? No/denies                Social History   Tobacco Use  Smoking Status Former   Current packs/day: 0.00   Types: Cigarettes   Start date: 66   Quit date: 1990   Years since quitting: 35.2   Passive exposure: Past  Smokeless Tobacco Never    Goals Met:  Independence with exercise equipment Exercise tolerated well No report of concerns or symptoms today Strength training completed today  Goals Unmet:  Not Applicable  Comments: Pt able to follow exercise prescription today without complaint.  Will continue to monitor for progression.    Dr. Bethann Punches is Medical Director for [facility] Cardiac Rehabilitation.  Dr. Vida Rigger is Medical Director for [facility] Pulmonary Rehabilitation.

## 2023-08-20 ENCOUNTER — Encounter: Admitting: *Deleted

## 2023-08-20 DIAGNOSIS — Z951 Presence of aortocoronary bypass graft: Secondary | ICD-10-CM

## 2023-08-20 NOTE — Progress Notes (Signed)
 Daily Session Note  Patient Details  Name: Christopher Pruitt MRN: [id number] Date of Birth: 1946/02/07 Referring Provider:   Flowsheet Row Cardiac Rehab from 07/12/2023 in [facility] Cardiac and Pulmonary Rehab  Referring Provider Dr. Windell Norfolk, MD       Encounter Date: 08/20/2023  Check In:  Session Check In - 08/20/23 1125       Check-In   Supervising physician immediately available to respond to emergencies See telemetry face sheet for immediately available ER MD    Location [facility]-Cardiac & Pulmonary Rehab    Staff Present Maxon Conetta BS, Exercise Physiologist;Noah Tickle, BS, Exercise Physiologist;Latiffany Harwick, RN, BSN, CCRP;Joseph Hood RCP,RRT,BSRT    Virtual Visit No    Medication changes reported     No    Fall or balance concerns reported    No    Warm-up and Cool-down Performed on first and last piece of equipment    Resistance Training Performed Yes    VAD Patient? No    PAD/SET Patient? No      Pain Assessment   Currently in Pain? No/denies                Social History   Tobacco Use  Smoking Status Former   Current packs/day: 0.00   Types: Cigarettes   Start date: 25   Quit date: 1990   Years since quitting: 35.2   Passive exposure: Past  Smokeless Tobacco Never    Goals Met:  Independence with exercise equipment Exercise tolerated well No report of concerns or symptoms today  Goals Unmet:  Not Applicable  Comments: Pt able to follow exercise prescription today without complaint.  Will continue to monitor for progression.    Dr. Bethann Punches is Medical Director for [facility] Cardiac Rehabilitation.  Dr. Vida Rigger is Medical Director for [facility] Pulmonary Rehabilitation.

## 2023-08-23 ENCOUNTER — Encounter: Admitting: *Deleted

## 2023-08-23 DIAGNOSIS — Z951 Presence of aortocoronary bypass graft: Secondary | ICD-10-CM | POA: Diagnosis not present

## 2023-08-23 NOTE — Progress Notes (Signed)
 Daily Session Note  Patient Details  Name: AVIS MCMAHILL MRN: [id number] Date of Birth: Apr 17, 1946 Referring Provider:   Flowsheet Row Cardiac Rehab from 07/12/2023 in [facility] Cardiac and Pulmonary Rehab  Referring Provider Dr. Joetta Mustache, MD       Encounter Date: 08/23/2023  Check In:  Session Check In - 08/23/23 1110       Check-In   Supervising physician immediately available to respond to emergencies See telemetry face sheet for immediately available ER MD    Location [facility]-Cardiac & Pulmonary Rehab    Staff Present Maud Sorenson, RN, BSN, CCRP;Maxon Conetta BS, Exercise Physiologist;Kelly Sabra Cramp BS, ACSM CEP, Exercise Physiologist;Noah Tickle, BS, Exercise Physiologist;Jason Martina Sledge RDN,LDN    Virtual Visit No    Medication changes reported     No    Fall or balance concerns reported    No    Warm-up and Cool-down Performed on first and last piece of equipment    Resistance Training Performed Yes    VAD Patient? No    PAD/SET Patient? No      Pain Assessment   Currently in Pain? No/denies                Social History   Tobacco Use  Smoking Status Former   Current packs/day: 0.00   Types: Cigarettes   Start date: 33   Quit date: 1990   Years since quitting: 35.3   Passive exposure: Past  Smokeless Tobacco Never    Goals Met:  Independence with exercise equipment Exercise tolerated well No report of concerns or symptoms today  Goals Unmet:  Not Applicable  Comments: Pt able to follow exercise prescription today without complaint.  Will continue to monitor for progression.    Dr. Firman Hughes is Medical Director for [facility] Cardiac Rehabilitation.  Dr. Fuad Aleskerov is Medical Director for [facility] Pulmonary Rehabilitation.

## 2023-08-24 ENCOUNTER — Inpatient Hospital Stay

## 2023-08-24 ENCOUNTER — Encounter: Payer: Self-pay | Admitting: Oncology

## 2023-08-24 ENCOUNTER — Inpatient Hospital Stay: Attending: Oncology | Admitting: Oncology

## 2023-08-24 VITALS — BP 122/56 | HR 54 | Temp 97.6°F | Resp 18

## 2023-08-24 DIAGNOSIS — N184 Chronic kidney disease, stage 4 (severe): Secondary | ICD-10-CM | POA: Diagnosis not present

## 2023-08-24 DIAGNOSIS — D631 Anemia in chronic kidney disease: Secondary | ICD-10-CM | POA: Diagnosis present

## 2023-08-24 DIAGNOSIS — Z8546 Personal history of malignant neoplasm of prostate: Secondary | ICD-10-CM | POA: Insufficient documentation

## 2023-08-24 DIAGNOSIS — N189 Chronic kidney disease, unspecified: Secondary | ICD-10-CM | POA: Insufficient documentation

## 2023-08-24 DIAGNOSIS — N1832 Chronic kidney disease, stage 3b: Secondary | ICD-10-CM

## 2023-08-24 DIAGNOSIS — Z87891 Personal history of nicotine dependence: Secondary | ICD-10-CM | POA: Diagnosis not present

## 2023-08-24 LAB — HEPATIC FUNCTION PANEL
ALT: 27 U/L (ref 0–44)
AST: 24 U/L (ref 15–41)
Albumin: 3.9 g/dL (ref 3.5–5.0)
Alkaline Phosphatase: 80 U/L (ref 38–126)
Bilirubin, Direct: 0.1 mg/dL (ref 0.0–0.2)
Indirect Bilirubin: 0.7 mg/dL (ref 0.3–0.9)
Total Bilirubin: 0.8 mg/dL (ref 0.0–1.2)
Total Protein: 6.7 g/dL (ref 6.5–8.1)

## 2023-08-24 LAB — CBC WITH DIFFERENTIAL/PLATELET
Abs Immature Granulocytes: 0.07 10*3/uL (ref 0.00–0.07)
Basophils Absolute: 0 10*3/uL (ref 0.0–0.1)
Basophils Relative: 1 %
Eosinophils Absolute: 0.1 10*3/uL (ref 0.0–0.5)
Eosinophils Relative: 2 %
HCT: 24.6 % — ABNORMAL LOW (ref 39.0–52.0)
Hemoglobin: 8 g/dL — ABNORMAL LOW (ref 13.0–17.0)
Immature Granulocytes: 2 %
Lymphocytes Relative: 32 %
Lymphs Abs: 1.1 10*3/uL (ref 0.7–4.0)
MCH: 29.9 pg (ref 26.0–34.0)
MCHC: 32.5 g/dL (ref 30.0–36.0)
MCV: 91.8 fL (ref 80.0–100.0)
Monocytes Absolute: 0.3 10*3/uL (ref 0.1–1.0)
Monocytes Relative: 9 %
Neutro Abs: 1.9 10*3/uL (ref 1.7–7.7)
Neutrophils Relative %: 54 %
Platelets: 124 10*3/uL — ABNORMAL LOW (ref 150–400)
RBC: 2.68 MIL/uL — ABNORMAL LOW (ref 4.22–5.81)
RDW: 16.3 % — ABNORMAL HIGH (ref 11.5–15.5)
Smear Review: NORMAL
WBC: 3.5 10*3/uL — ABNORMAL LOW (ref 4.0–10.5)
nRBC: 0 % (ref 0.0–0.2)

## 2023-08-24 LAB — TECHNOLOGIST SMEAR REVIEW

## 2023-08-24 LAB — RETIC PANEL
Immature Retic Fract: 6.1 % (ref 2.3–15.9)
RBC.: 2.64 MIL/uL — ABNORMAL LOW (ref 4.22–5.81)
Retic Count, Absolute: 26.4 10*3/uL (ref 19.0–186.0)
Retic Ct Pct: 1 % (ref 0.4–3.1)
Reticulocyte Hemoglobin: 31.8 pg (ref >27.9)

## 2023-08-24 LAB — FERRITIN: Ferritin: 364 ng/mL — ABNORMAL HIGH (ref 24–336)

## 2023-08-24 LAB — IRON AND TIBC
Iron: 66 ug/dL (ref 45–182)
Saturation Ratios: 20 % (ref 17.9–39.5)
TIBC: 325 ug/dL (ref 250–450)
UIBC: 259 ug/dL

## 2023-08-24 LAB — VITAMIN B12: Vitamin B-12: 447 pg/mL (ref 180–914)

## 2023-08-24 LAB — FOLATE: Folate: >40 ng/mL (ref >5.9)

## 2023-08-24 NOTE — Progress Notes (Signed)
 Hematology/Oncology Consult note [phone] [phone]        REFERRING PROVIDER: Sari Cunning, MD   CHIEF COMPLAINTS/REASON FOR VISIT:  Evaluation of anemia.    ASSESSMENT & PLAN:   Anemia in chronic kidney disease (CKD) Anemia is likely secondary to chronic kidney disease. Previous labs were reviewed and discussed with patient. Lab Results  Component Value Date   HGB 8.0 (L) 08/24/2023   TIBC 325 08/24/2023   IRONPCTSAT 20 08/24/2023   FERRITIN 364 (H) 08/24/2023    Ferritin is >300, at goal. I recommend erythropoietin replacement therapy.  Recommend Retacrit 40,000 unit monthly. Rationale potential side effects were reviewed with patient and he agrees with the plan.  CKD (chronic kidney disease) stage 3, GFR 30-59 ml/min (HCC) Encourage oral hydration and avoid nephrotoxins.  Check myeloma panel in light chain ratio.   Orders Placed This Encounter  Procedures   Ferritin    Standing Status:   Future    Number of Occurrences:   1    Expected Date:   08/24/2023    Expiration Date:   02/23/2024   Iron and TIBC    Standing Status:   Future    Number of Occurrences:   1    Expected Date:   08/24/2023    Expiration Date:   08/23/2024   Retic Panel    Standing Status:   Future    Number of Occurrences:   1    Expected Date:   08/24/2023    Expiration Date:   08/23/2024   CBC with Differential/Platelet    Standing Status:   Future    Number of Occurrences:   1    Expected Date:   08/24/2023    Expiration Date:   08/23/2024   Multiple Myeloma Panel (SPEP&IFE w/QIG)    Standing Status:   Future    Number of Occurrences:   1    Expected Date:   08/24/2023    Expiration Date:   08/23/2024   Kappa/lambda light chains    Standing Status:   Future    Number of Occurrences:   1    Expected Date:   08/24/2023    Expiration Date:   08/23/2024   Folate    Standing Status:   Future    Number of Occurrences:   1    Expected Date:   08/24/2023     Expiration Date:   08/23/2024   Vitamin B12    Standing Status:   Future    Number of Occurrences:   1    Expected Date:   08/24/2023    Expiration Date:   08/23/2024   Technologist smear review    Standing Status:   Future    Number of Occurrences:   1    Expected Date:   08/24/2023    Expiration Date:   08/23/2024    Clinical information::   anemia   Hepatic function panel    Standing Status:   Future    Number of Occurrences:   1    Expected Date:   08/24/2023    Expiration Date:   08/23/2024   Follow-up in 4 weeks All questions were answered. The patient knows to call the clinic with any problems, questions or concerns.  Christopher Forbes, MD, PhD [facility] Hematology Oncology 08/24/2023   HISTORY OF PRESENTING ILLNESS:   Christopher Pruitt is a  78 y.o.  male with PMH listed below was seen in consultation at the  request of  Sari Cunning, MD  for evaluation of anemia.   05/06/2023 - 05/10/2023 patient was hospitalized at [facility] health due to acute respiratory failure with hypoxia, non-ST elevated myocardial infarction.  Patient was treated with heparin drip.  Cardiac cath showed diffuse disease.  Patient was also treated for atypical pneumonia. January 2025, patient followed up with cardiology and was admitted for optimization prior to bypass surgery.  He was found to have severe anemia and received IV Venofer 2 50 mg daily x 4 doses as well as Retacrit 40,000 unit x 12 June 2023, patient underwent coronary artery bypass graft x 3 at [facility]. He received a blood transfusion during this hospitalization due to low hemoglobin levels, which were as low as 6.6 g/dL.  He recalls having multiple blood draws during his hospital stay, which he believes contributed to his anemia.  His most recent hemoglobin level was 8 g/dL.  He has a history of a heart attack in December, followed by a planned triple bypass surgery in February. He has chronic kidney disease stage 4, which was present before his recent  heart surgery. This condition may affect erythropoietin production, impacting his anemia management.  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, ED&C   Urge urinary incontinence    Wears glasses    Wears hearing aid    BILATERAL    SURGICAL HISTORY: Past Surgical History:  Procedure Laterality Date   CARPAL TUNNEL RELEASE Left 10/25/2018   Procedure: CARPAL TUNNEL RELEASE ENDOSCOPIC LEFT, DIABETIC, SLEEPAPNEA;  Surgeon: Elner Hahn, MD;  Location: [facility] ORS;  Service: Orthopedics;  Laterality: Left;   CARPAL TUNNEL RELEASE Right 02/01/2019   Procedure: CARPAL TUNNEL RELEASE ENDOSCOPIC;  Surgeon: Elner Hahn, MD;  Location: [facility] ORS;  Service: Orthopedics;  Laterality: Right;   colonoscopy with polypectomy     COLONOSCOPY WITH PROPOFOL N/A 07/27/2018   Procedure: COLONOSCOPY WITH PROPOFOL;  Surgeon: Toledo, Alphonsus Jeans, MD;  Location: [facility] ENDOSCOPY;  Service: Gastroenterology;  Laterality: N/A;   heart bypass surgery  06/17/2023   HYDROCELE EXCISION Left 02/11/2015   Procedure: LEFT HYDROCELECTOMY ADULT;  Surgeon: Bart Born, MD;  Location: [facility];  Service: Urology;  Laterality: Left;   INGUINAL HERNIA REPAIR Bilateral 04/18/2021   Procedure: LAPAROSCOPIC BILATERAL  HERNIA REPAIR WITH MESH, TRANSABDOMINAL PLANE BLOCK, LEFT INCARCARATED SCROTAL HERNIA, RIGHT INGUINAL HERNIA REPAIR;  Surgeon: Candyce Champagne, MD;  Location: [facility];  Service: General;  Laterality: Bilateral;   JOINT REPLACEMENT     Rt knee   LEFT HEART CATH AND CORONARY ANGIOGRAPHY N/A 05/10/2023   Procedure: LEFT HEART CATH AND CORONARY ANGIOGRAPHY;  Surgeon: Percival Brace, MD;  Location: [facility] INVASIVE CV LAB;  Service: Cardiovascular;  Laterality: N/A;   PARTIAL KNEE ARTHROPLASTY Right 2012   PLACEMENT GOLD STUDS IN PROSTATE  2014   [place]   for [facility] Radiation (prostate cancer)   SHOULDER OPEN ROTATOR CUFF REPAIR Left 2010   TONSILLECTOMY  as child    SOCIAL HISTORY: Social History   Socioeconomic History   Marital status: Married    Spouse name: Not on file   Number of children: Not on file   Years of education: Not on file   Highest education level: Not on file  Occupational History   Not on file  Tobacco Use   Smoking status: Former    Current packs/day: 0.00    Types: Cigarettes    Start date: 33    Quit date: 1990    Years since quitting: 35.3    Passive exposure: Past   Smokeless tobacco: Never  Vaping Use   Vaping status: Never Used  Substance and Sexual Activity   Alcohol use: No   Drug use: No   Sexual activity: Not on file  Other Topics Concern   Not on file  Social History Narrative   Not on file   Social Drivers of Health   Financial Resource Strain: Low Risk  (08/19/2023)   Received from [facility]   Overall Financial Resource Strain (CARDIA)    Difficulty of Paying Living Expenses: Not hard at all  Food Insecurity: No Food Insecurity (08/24/2023)   Hunger Vital Sign    Worried About Running Out of Food in the Last Year: Never true    Ran Out of Food in the Last Year: Never  true  Transportation Needs: No Transportation Needs (08/24/2023)   PRAPARE - Administrator, Civil Service (Medical): No    Lack of Transportation (Non-Medical): No  Recent Concern: Transportation Needs - Unmet Transportation Needs (06/24/2023)   [facility] Transportation    In the past 12 months, has lack of transportation kept you from medical appointments or from getting medications?: Yes    Lack of Transportation (Non-Medical): Yes  Physical Activity: Not on file  Stress: Not on file  Social Connections: Not on file  Intimate Partner Violence: Not At Risk (05/08/2023)   Humiliation, Afraid, Rape, and Kick questionnaire    Fear of Current or Ex-Partner: No    Emotionally Abused: No    Physically Abused: No    Sexually Abused: No    FAMILY HISTORY: Family History  Problem Relation Age of Onset   Colon cancer Mother    Heart attack Father     ALLERGIES:  is allergic to amoxicillin-pot clavulanate, oxycodone, clindamycin/lincomycin, nalfon [fenoprofen calcium], and robaxin [methocarbamol].  MEDICATIONS:  Current Outpatient Medications  Medication Sig Dispense Refill   acetaminophen (TYLENOL) 650 MG CR tablet Take 650 mg by mouth every 8 (eight) hours as needed for pain.     albuterol (VENTOLIN HFA) 108 (90 Base) MCG/ACT inhaler Inhale 2 puffs into the lungs every 6 (six) hours as needed for wheezing or shortness of breath. 8 g 2   aspirin EC 81 MG tablet Take 1 tablet (81 mg  total) by mouth daily. Swallow whole. 150 tablet 1   atorvastatin (LIPITOR) 40 MG tablet Take by mouth.     cyanocobalamin (VITAMIN B12) 1000 MCG/ML injection Inject into the muscle.     FLUoxetine (PROZAC) 20 MG capsule Take by mouth.     levothyroxine (SYNTHROID, LEVOTHROID) 125 MCG tablet Take 125 mcg by mouth daily before breakfast.     lisinopril (ZESTRIL) 10 MG tablet Take 1 tablet (10 mg total) by mouth daily. 30 tablet 2   metoprolol tartrate  (LOPRESSOR) 25 MG tablet Take by mouth.     midodrine (PROAMATINE) 5 MG tablet Take 1 tablet by mouth 2 (two) times daily.     ONETOUCH ULTRA TEST test strip USE 1 EACH (1 STRIP TOTAL) ONCE DAILY USE AS INSTRUCTED.     oxybutynin (DITROPAN XL) 15 MG 24 hr tablet Take 15 mg by mouth at bedtime.     tamsulosin (FLOMAX) 0.4 MG CAPS capsule Take 0.4 mg by mouth daily after breakfast.     amiodarone (PACERONE) 200 MG tablet Take by mouth. (Patient not taking: Reported on 08/24/2023)     furosemide (LASIX) 20 MG tablet Take 1 tablet (20 mg total) by mouth daily. (Patient not taking: Reported on 08/24/2023) 30 tablet 1   ipratropium-albuterol (DUONEB) 0.5-2.5 (3) MG/3ML SOLN Take 3 mLs by nebulization every 6 (six) hours as needed (wheezing, shortness of breath). (Patient not taking: Reported on 08/24/2023) 180 mL 1   metoprolol succinate (TOPROL-XL) 25 MG 24 hr tablet Take 1 tablet (25 mg total) by mouth daily. (Patient not taking: Reported on 08/24/2023) 30 tablet 2   No current facility-administered medications for this visit.    Review of Systems  Constitutional:  Negative for appetite change, chills, fever and unexpected weight change.  HENT:   Negative for hearing loss and voice change.   Eyes:  Negative for eye problems and icterus.  Respiratory:  Negative for chest tightness, cough and shortness of breath.   Cardiovascular:  Negative for chest pain and leg swelling.  Gastrointestinal:  Negative for abdominal distention and abdominal pain.  Endocrine: Negative for hot flashes.  Genitourinary:  Negative for difficulty urinating, dysuria and frequency.   Musculoskeletal:  Negative for arthralgias.  Skin:  Negative for itching and rash.  Neurological:  Negative for light-headedness and numbness.  Hematological:  Negative for adenopathy. Does not bruise/bleed easily.  Psychiatric/Behavioral:  Negative for confusion.    PHYSICAL EXAMINATION: ECOG PERFORMANCE STATUS: 1 - Symptomatic but completely  ambulatory Vitals:   08/24/23 0933  BP: (!) 122/56  Pulse: (!) 54  Resp: 18  Temp: 97.6 F (36.4 C)   There were no vitals filed for this visit.  Physical Exam Constitutional:      General: He is not in acute distress. HENT:     Head: Normocephalic and atraumatic.  Eyes:     General: No scleral icterus. Cardiovascular:     Rate and Rhythm: Normal rate and regular rhythm.     Heart sounds: Normal heart sounds.  Pulmonary:     Effort: Pulmonary effort is normal. No respiratory distress.     Breath sounds: No wheezing.  Abdominal:     General: Bowel sounds are normal. There is no distension.     Palpations: Abdomen is soft.  Musculoskeletal:        General: No deformity. Normal range of motion.     Cervical back: Normal range of motion and neck supple.     Left lower leg: Edema present.  Skin:    General: Skin is warm and dry.     Findings: No erythema or rash.  Neurological:     Mental Status: He is alert and oriented to person, place, and time. Mental status is at baseline.     Cranial Nerves: No cranial nerve deficit.     Coordination: Coordination normal.  Psychiatric:        Mood and Affect: Mood normal.     LABORATORY DATA:  I have reviewed the data as listed    Latest Ref Rng & Units 08/24/2023   10:13 AM 05/10/2023    3:14 AM 05/09/2023    4:51 AM  CBC  WBC 4.0 - 10.5 K/uL 3.5  4.6  5.4   Hemoglobin 13.0 - 17.0 g/dL 8.0  9.9  65.7   Hematocrit 39.0 - 52.0 % 24.6  28.7  28.8   Platelets 150 - 400 K/uL 124  107  106       Latest Ref Rng & Units 08/24/2023   10:13 AM 05/09/2023    4:51 AM 05/08/2023    4:42 AM  CMP  Glucose 70 - 99 mg/dL  846  962   BUN 8 - 23 mg/dL  32  43   Creatinine 9.52 - 1.24 mg/dL  8.41  3.24   Sodium 401 - 145 mmol/L  136  135   Potassium 3.5 - 5.1 mmol/L  4.0  3.9   Chloride 98 - 111 mmol/L  102  101   CO2 22 - 32 mmol/L  25  24   Calcium 8.9 - 10.3 mg/dL  8.7  8.7   Total Protein 6.5 - 8.1 g/dL 6.7     Total Bilirubin  0.0 - 1.2 mg/dL 0.8     Alkaline Phos 38 - 126 U/L 80     AST 15 - 41 U/L 24     ALT 0 - 44 U/L 27         RADIOGRAPHIC STUDIES: I have personally reviewed the radiological images as listed and agreed with the findings in the report. No results found.

## 2023-08-24 NOTE — Assessment & Plan Note (Signed)
 Encourage oral hydration and avoid nephrotoxins.  Check myeloma panel in light chain ratio.

## 2023-08-24 NOTE — Assessment & Plan Note (Addendum)
 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 Previous labs were reviewed and discussed with patient. Lab Results  Component Value Date   HGB 8.0 (L) 08/24/2023   TIBC 325 08/24/2023   IRONPCTSAT 20 08/24/2023   FERRITIN 364 (H) 08/24/2023    Ferritin is >300, at goal. I recommend erythropoietin replacement therapy.  Recommend Retacrit 40,000 unit monthly. Rationale potential side effects were reviewed with patient and he agrees with the plan.

## 2023-08-24 NOTE — Progress Notes (Signed)
 Pt here to establish care.

## 2023-08-25 ENCOUNTER — Telehealth: Payer: Self-pay

## 2023-08-25 ENCOUNTER — Encounter: Admitting: *Deleted

## 2023-08-25 DIAGNOSIS — Z951 Presence of aortocoronary bypass graft: Secondary | ICD-10-CM

## 2023-08-25 LAB — KAPPA/LAMBDA LIGHT CHAINS
Kappa free light chain: 35.2 mg/L — ABNORMAL HIGH (ref 3.3–19.4)
Kappa, lambda light chain ratio: 1.48 (ref 0.26–1.65)
Lambda free light chains: 23.8 mg/L (ref 5.7–26.3)

## 2023-08-25 NOTE — Progress Notes (Signed)
 Daily Session Note  Patient Details  Name: Christopher Pruitt MRN: [id number] Date of Birth: 1945/09/20 Referring Provider:   Flowsheet Row Cardiac Rehab from 07/12/2023 in [facility] Cardiac and Pulmonary Rehab  Referring Provider Dr. Joetta Mustache, MD       Encounter Date: 08/25/2023  Check In:  Session Check In - 08/25/23 1200       Check-In   Supervising physician immediately available to respond to emergencies See telemetry face sheet for immediately available ER MD    Location [facility]-Cardiac & Pulmonary Rehab    Staff Present Lyell Samuel, MS, Exercise Physiologist;Maxon Conetta BS, Exercise Physiologist;Bethanne Mule, RN, BSN, CCRP;Meredith Craven RN,BSN    Virtual Visit No    Medication changes reported     No    Fall or balance concerns reported    No    Warm-up and Cool-down Performed on first and last piece of equipment    Resistance Training Performed Yes    VAD Patient? No    PAD/SET Patient? No      Pain Assessment   Currently in Pain? No/denies                Social History   Tobacco Use  Smoking Status Former   Current packs/day: 0.00   Types: Cigarettes   Start date: 59   Quit date: 1990   Years since quitting: 35.3   Passive exposure: Past  Smokeless Tobacco Never    Goals Met:  Independence with exercise equipment Exercise tolerated well No report of concerns or symptoms today  Goals Unmet:  Not Applicable  Comments: Pt able to follow exercise prescription today without complaint.  Will continue to monitor for progression.    Dr. Firman Hughes is Medical Director for [facility] Cardiac Rehabilitation.  Dr. Fuad Aleskerov is Medical Director for [facility] Pulmonary Rehabilitation.

## 2023-08-25 NOTE — Telephone Encounter (Signed)
 Spoke to pt's wife and informed her of follow up plan.   She's not at home at the moment, but would like a call later this afternoon to set up appts:  Retacrit ASAP 1 month: Lab/ MD/ +/- retacrit

## 2023-08-25 NOTE — Telephone Encounter (Signed)
-----   Message from [name] sent at 08/24/2023  7:17 PM EDT ----- Please let patient know that his iron level is adequate.  I recommend patient to get erythropoietin replacement therapy. Please arrange patient to get Retacrit ASAP. Follow-up 1 month lab MD +/- Retacrit.  Lab orders are in.

## 2023-08-26 ENCOUNTER — Telehealth: Payer: Self-pay | Admitting: Oncology

## 2023-08-26 ENCOUNTER — Encounter: Payer: Self-pay | Admitting: Oncology

## 2023-08-26 LAB — MULTIPLE MYELOMA PANEL, SERUM
Albumin SerPl Elph-Mcnc: 3.4 g/dL (ref 2.9–4.4)
Albumin/Glob SerPl: 1.3 (ref 0.7–1.7)
Alpha 1: 0.3 g/dL (ref 0.0–0.4)
Alpha2 Glob SerPl Elph-Mcnc: 0.7 g/dL (ref 0.4–1.0)
B-Globulin SerPl Elph-Mcnc: 0.8 g/dL (ref 0.7–1.3)
Gamma Glob SerPl Elph-Mcnc: 0.9 g/dL (ref 0.4–1.8)
Globulin, Total: 2.7 g/dL (ref 2.2–3.9)
IgA: 145 mg/dL (ref 61–437)
IgG (Immunoglobin G), Serum: 975 mg/dL (ref 603–1613)
IgM (Immunoglobulin M), Srm: 68 mg/dL (ref 15–143)
M Protein SerPl Elph-Mcnc: 0.4 g/dL — ABNORMAL HIGH
Total Protein ELP: 6.1 g/dL (ref 6.0–8.5)

## 2023-08-26 NOTE — Telephone Encounter (Signed)
 Patients wife called to schedule these appointments:   Retacrit ASAP 1 month: Lab/ MD/ +/- retacrit

## 2023-08-26 NOTE — Telephone Encounter (Signed)
 Wife called back and appts have been scheduled.

## 2023-08-27 ENCOUNTER — Inpatient Hospital Stay

## 2023-08-27 ENCOUNTER — Encounter

## 2023-08-27 VITALS — BP 124/54

## 2023-08-27 DIAGNOSIS — N1832 Chronic kidney disease, stage 3b: Secondary | ICD-10-CM

## 2023-08-27 DIAGNOSIS — N184 Chronic kidney disease, stage 4 (severe): Secondary | ICD-10-CM | POA: Diagnosis not present

## 2023-08-27 MED ORDER — EPOETIN ALFA-EPBX 40000 UNIT/ML IJ SOLN
40000.0000 [IU] | INTRAMUSCULAR | Status: DC
Start: 2023-08-27 — End: 2023-08-27
  Administered 2023-08-27: 40000 [IU] via SUBCUTANEOUS
  Filled 2023-08-27: qty 1

## 2023-08-30 ENCOUNTER — Encounter: Admitting: *Deleted

## 2023-08-30 DIAGNOSIS — Z951 Presence of aortocoronary bypass graft: Secondary | ICD-10-CM | POA: Diagnosis not present

## 2023-08-30 NOTE — Progress Notes (Signed)
 Daily Session Note  Patient Details  Name: MELQUIADES KOVAR MRN: [id number] Date of Birth: [birth year] Referring Provider:   Flowsheet Row Cardiac Rehab from 07/12/2023 in [facility] Cardiac and Pulmonary Rehab  Referring Provider Dr. Joetta Mustache, MD       Encounter Date: 08/30/2023  Check In:  Session Check In - 08/30/23 1118       Check-In   Supervising physician immediately available to respond to emergencies See telemetry face sheet for immediately available ER MD    Location [facility]-Cardiac & Pulmonary Rehab    Staff Present Maud Sorenson, RN, BSN, CCRP;Joseph Hood RCP,RRT,BSRT;Maxon [employer], Exercise Physiologist;Kelly [employer], ACSM CEP, Exercise Physiologist    Virtual Visit No    Medication changes reported     No    Fall or balance concerns reported    No    Warm-up and Cool-down Performed on first and last piece of equipment    Resistance Training Performed Yes    VAD Patient? No    PAD/SET Patient? No      Pain Assessment   Currently in Pain? No/denies                Social History   Tobacco Use  Smoking Status Former   Current packs/day: 0.00   Types: Cigarettes   Start date: 76   Quit date: 1990   Years since quitting: 35.3   Passive exposure: Past  Smokeless Tobacco Never    Goals Met:  Independence with exercise equipment Exercise tolerated well No report of concerns or symptoms today  Goals Unmet:  Not Applicable  Comments: Pt able to follow exercise prescription today without complaint.  Will continue to monitor for progression.    Dr. Firman Hughes is Medical Director for [facility] Cardiac Rehabilitation.  Dr. Fuad Aleskerov is Medical Director for [facility] Pulmonary Rehabilitation.

## 2023-09-01 ENCOUNTER — Encounter: Admitting: *Deleted

## 2023-09-01 DIAGNOSIS — Z951 Presence of aortocoronary bypass graft: Secondary | ICD-10-CM

## 2023-09-01 NOTE — Progress Notes (Signed)
 Daily Session Note  Patient Details  Name: Christopher Pruitt MRN: [id number] Date of Birth: [birth year] Referring Provider:   Flowsheet Row Cardiac Rehab from 07/12/2023 in [facility] Cardiac and Pulmonary Rehab  Referring Provider Dr. Joetta Mustache, MD       Encounter Date: 09/01/2023  Check In:  Session Check In - 09/01/23 1133       Check-In   Supervising physician immediately available to respond to emergencies See telemetry face sheet for immediately available ER MD    Location [facility]-Cardiac & Pulmonary Rehab    Staff Present Lyell Samuel, MS, Exercise Physiologist;Maxon Conetta BS, Exercise Physiologist;Brandis Wixted, RN, BSN, CCRP;Meredith Manson Seitz RN,BSN;Joseph [employer]    Virtual Visit No    Medication changes reported     No    Fall or balance concerns reported    No    Warm-up and Cool-down Performed on first and last piece of equipment    Resistance Training Performed Yes    VAD Patient? No    PAD/SET Patient? No      Pain Assessment   Currently in Pain? No/denies                Social History   Tobacco Use  Smoking Status Former   Current packs/day: 0.00   Types: Cigarettes   Start date: 55   Quit date: 1990   Years since quitting: 35.3   Passive exposure: Past  Smokeless Tobacco Never    Goals Met:  Independence with exercise equipment Exercise tolerated well No report of concerns or symptoms today  Goals Unmet:  Not Applicable  Comments: Pt able to follow exercise prescription today without complaint.  Will continue to monitor for progression.    Dr. Firman Hughes is Medical Director for [facility] Cardiac Rehabilitation.  Dr. Fuad Aleskerov is Medical Director for [facility] Pulmonary Rehabilitation.

## 2023-09-03 ENCOUNTER — Encounter: Admitting: *Deleted

## 2023-09-03 DIAGNOSIS — Z951 Presence of aortocoronary bypass graft: Secondary | ICD-10-CM

## 2023-09-03 NOTE — Progress Notes (Signed)
 Daily Session Note  Patient Details  Name: Christopher Pruitt MRN: [id number] Date of Birth: 08-01-1945 Referring Provider:   Flowsheet Row Cardiac Rehab from 07/12/2023 in [facility] Cardiac and Pulmonary Rehab  Referring Provider Dr. Joetta Mustache, MD       Encounter Date: 09/03/2023  Check In:  Session Check In - 09/03/23 1123       Check-In   Supervising physician immediately available to respond to emergencies See telemetry face sheet for immediately available ER MD    Location [facility]-Cardiac & Pulmonary Rehab    Staff Present Maxon Conetta BS, Exercise Physiologist;Noah Tickle, BS, Exercise Physiologist;Harbour Nordmeyer, RN, BSN, CCRP;Joseph Hood RCP,RRT,BSRT    Virtual Visit No    Medication changes reported     No    Fall or balance concerns reported    No    Warm-up and Cool-down Performed on first and last piece of equipment    Resistance Training Performed Yes    VAD Patient? No    PAD/SET Patient? No      Pain Assessment   Currently in Pain? No/denies                Social History   Tobacco Use  Smoking Status Former   Current packs/day: 0.00   Types: Cigarettes   Start date: 8   Quit date: 1990   Years since quitting: 35.3   Passive exposure: Past  Smokeless Tobacco Never    Goals Met:  Independence with exercise equipment Exercise tolerated well No report of concerns or symptoms today  Goals Unmet:  Not Applicable  Comments: Pt able to follow exercise prescription today without complaint.  Will continue to monitor for progression.    Dr. Firman Hughes is Medical Director for [facility] Cardiac Rehabilitation.  Dr. Fuad Aleskerov is Medical Director for [facility] Pulmonary Rehabilitation.

## 2023-09-06 ENCOUNTER — Encounter: Admitting: *Deleted

## 2023-09-06 DIAGNOSIS — Z951 Presence of aortocoronary bypass graft: Secondary | ICD-10-CM

## 2023-09-06 NOTE — Progress Notes (Signed)
 Daily Session Note  Patient Details  Name: Christopher Pruitt MRN: [id number] Date of Birth: [birth year] Referring Provider:   Flowsheet Row Cardiac Rehab from 07/12/2023 in [facility] Cardiac and Pulmonary Rehab  Referring Provider Dr. Joetta Mustache, MD       Encounter Date: 09/06/2023  Check In:  Session Check In - 09/06/23 1133       Check-In   Supervising physician immediately available to respond to emergencies See telemetry face sheet for immediately available ER MD    Location [facility]-Cardiac & Pulmonary Rehab    Staff Present Maud Sorenson, RN, BSN, CCRP;Meredith Manson Seitz RN,BSN;Joseph Hood RCP,RRT,BSRT;Kelly Belle Meade BS, ACSM CEP, Exercise Physiologist;Maxon Conetta BS, Exercise Physiologist    Virtual Visit No    Medication changes reported     No    Fall or balance concerns reported    No    Warm-up and Cool-down Performed on first and last piece of equipment    Resistance Training Performed Yes    VAD Patient? No    PAD/SET Patient? No      Pain Assessment   Currently in Pain? No/denies                Social History   Tobacco Use  Smoking Status Former   Current packs/day: 0.00   Types: Cigarettes   Start date: 62   Quit date: 1990   Years since quitting: 35.3   Passive exposure: Past  Smokeless Tobacco Never    Goals Met:  Independence with exercise equipment Exercise tolerated well No report of concerns or symptoms today  Goals Unmet:  Not Applicable  Comments: Pt able to follow exercise prescription today without complaint.  Will continue to monitor for progression.    Dr. Firman Hughes is Medical Director for [facility] Cardiac Rehabilitation.  Dr. Fuad Aleskerov is Medical Director for [facility] Pulmonary Rehabilitation.

## 2023-09-08 ENCOUNTER — Encounter (INDEPENDENT_AMBULATORY_CARE_PROVIDER_SITE_OTHER): Payer: Medicare Other | Admitting: Ophthalmology

## 2023-09-08 ENCOUNTER — Encounter

## 2023-09-08 DIAGNOSIS — I10 Essential (primary) hypertension: Secondary | ICD-10-CM

## 2023-09-08 DIAGNOSIS — H35033 Hypertensive retinopathy, bilateral: Secondary | ICD-10-CM | POA: Diagnosis not present

## 2023-09-08 DIAGNOSIS — H35371 Puckering of macula, right eye: Secondary | ICD-10-CM

## 2023-09-08 DIAGNOSIS — H43813 Vitreous degeneration, bilateral: Secondary | ICD-10-CM | POA: Diagnosis not present

## 2023-09-10 ENCOUNTER — Encounter: Attending: Cardiology | Admitting: *Deleted

## 2023-09-10 DIAGNOSIS — Z951 Presence of aortocoronary bypass graft: Secondary | ICD-10-CM | POA: Insufficient documentation

## 2023-09-10 NOTE — Progress Notes (Signed)
 Daily Session Note  Patient Details  Name: Christopher Pruitt MRN: [id number] Date of Birth: 08-06-1945 Referring Provider:   Flowsheet Row Cardiac Rehab from 07/12/2023 in [facility] Cardiac and Pulmonary Rehab  Referring Provider Dr. Joetta Mustache, MD       Encounter Date: 09/10/2023  Check In:  Session Check In - 09/10/23 1129       Check-In   Supervising physician immediately available to respond to emergencies See telemetry face sheet for immediately available ER MD    Location [facility]-Cardiac & Pulmonary Rehab    Staff Present Maxon Conetta BS, Exercise Physiologist;Noah Tickle, BS, Exercise Physiologist;Clevie Prout, RN, BSN, CCRP;Joseph Hood RCP,RRT,BSRT    Virtual Visit No    Medication changes reported     No    Fall or balance concerns reported    No    Warm-up and Cool-down Performed on first and last piece of equipment    Resistance Training Performed Yes    VAD Patient? No    PAD/SET Patient? No      Pain Assessment   Currently in Pain? No/denies                Social History   Tobacco Use  Smoking Status Former   Current packs/day: 0.00   Types: Cigarettes   Start date: 64   Quit date: 1990   Years since quitting: 35.3   Passive exposure: Past  Smokeless Tobacco Never    Goals Met:  Independence with exercise equipment Exercise tolerated well No report of concerns or symptoms today  Goals Unmet:  Not Applicable  Comments: Pt able to follow exercise prescription today without complaint.  Will continue to monitor for progression.    Dr. Firman Hughes is Medical Director for [facility] Cardiac Rehabilitation.  Dr. Fuad Aleskerov is Medical Director for [facility] Pulmonary Rehabilitation.

## 2023-09-13 ENCOUNTER — Encounter: Admitting: *Deleted

## 2023-09-13 DIAGNOSIS — Z951 Presence of aortocoronary bypass graft: Secondary | ICD-10-CM

## 2023-09-13 NOTE — Progress Notes (Signed)
 Daily Session Note  Patient Details  Name: Christopher Pruitt MRN: [id number] Date of Birth: [birth year] Referring Provider:   Flowsheet Row Cardiac Rehab from 07/12/2023 in [facility] Cardiac and Pulmonary Rehab  Referring Provider Dr. Joetta Mustache, MD       Encounter Date: 09/13/2023  Check In:  Session Check In - 09/13/23 1119       Check-In   Supervising physician immediately available to respond to emergencies See telemetry face sheet for immediately available ER MD    Location [facility]-Cardiac & Pulmonary Rehab    Staff Present Maxon Beauford Bounds, Exercise Physiologist;Joseph Hood RCP,RRT,BSRT;Denzal Meir Manson Seitz RN,BSN;Kelly Sylvarena BS, ACSM CEP, Exercise Physiologist    Virtual Visit No    Medication changes reported     No    Fall or balance concerns reported    No    Warm-up and Cool-down Performed on first and last piece of equipment    Resistance Training Performed Yes    VAD Patient? No    PAD/SET Patient? No      Pain Assessment   Currently in Pain? No/denies                Social History   Tobacco Use  Smoking Status Former   Current packs/day: 0.00   Types: Cigarettes   Start date: 7   Quit date: 1990   Years since quitting: 35.3   Passive exposure: Past  Smokeless Tobacco Never    Goals Met:  Independence with exercise equipment Exercise tolerated well No report of concerns or symptoms today Strength training completed today  Goals Unmet:  Not Applicable  Comments: Pt able to follow exercise prescription today without complaint.  Will continue to monitor for progression.    Dr. Firman Hughes is Medical Director for [facility] Cardiac Rehabilitation.  Dr. Fuad Aleskerov is Medical Director for [facility] Pulmonary Rehabilitation.

## 2023-09-15 ENCOUNTER — Encounter: Admitting: *Deleted

## 2023-09-15 ENCOUNTER — Encounter: Payer: Self-pay | Admitting: *Deleted

## 2023-09-15 DIAGNOSIS — Z951 Presence of aortocoronary bypass graft: Secondary | ICD-10-CM

## 2023-09-15 NOTE — Progress Notes (Signed)
 Daily Session Note  Patient Details  Name: Christopher Pruitt MRN: [id number] Date of Birth: June 22, 1945 Referring Provider:   Flowsheet Row Cardiac Rehab from 07/12/2023 in [facility] Cardiac and Pulmonary Rehab  Referring Provider Dr. Joetta Mustache, MD       Encounter Date: 09/15/2023  Check In:  Session Check In - 09/15/23 1112       Check-In   Supervising physician immediately available to respond to emergencies See telemetry face sheet for immediately available ER MD    Location [facility]-Cardiac & Pulmonary Rehab    Staff Present Maud Sorenson, RN, BSN, CCRP;Meredith Manson Seitz RN,BSN;Joseph Neldon [place], [place], RRT, CPFT;Maxon Conetta BS, Exercise Physiologist    Virtual Visit No    Medication changes reported     No    Fall or balance concerns reported    No    Warm-up and Cool-down Performed on first and last piece of equipment    Resistance Training Performed Yes    VAD Patient? No    PAD/SET Patient? No      Pain Assessment   Currently in Pain? No/denies                Social History   Tobacco Use  Smoking Status Former   Current packs/day: 0.00   Types: Cigarettes   Start date: 70   Quit date: 1990   Years since quitting: 35.3   Passive exposure: Past  Smokeless Tobacco Never    Goals Met:  Independence with exercise equipment Exercise tolerated well No report of concerns or symptoms today  Goals Unmet:  Not Applicable  Comments: Pt able to follow exercise prescription today without complaint.  Will continue to monitor for progression.    Dr. Firman Hughes is Medical Director for [facility] Cardiac Rehabilitation.  Dr. Fuad Aleskerov is Medical Director for [facility] Pulmonary Rehabilitation.

## 2023-09-15 NOTE — Progress Notes (Signed)
 Cardiac Individual Treatment Plan  Patient Details  Name: Christopher Pruitt MRN: [id number] Date of Birth: [birth year] Referring Provider:   Flowsheet Row Cardiac Rehab from 07/12/2023 in [facility] Cardiac and Pulmonary Rehab  Referring Provider Dr. Joetta Mustache, MD       Initial Encounter Date:  Flowsheet Row Cardiac Rehab from 07/12/2023 in [facility] Cardiac and Pulmonary Rehab  Date 07/12/23       Visit Diagnosis: S/P CABG x 3  Patient's Home Medications on Admission:  Current Outpatient Medications:    acetaminophen  (TYLENOL ) 650 MG CR tablet, Take 650 mg by mouth every 8 (eight) hours as needed for pain., Disp: , Rfl:    albuterol  (VENTOLIN  HFA) 108 (90 Base) MCG/ACT inhaler, Inhale 2 puffs into the lungs every 6 (six) hours as needed for wheezing or shortness of breath., Disp: 8 g, Rfl: 2   amiodarone (PACERONE) 200 MG tablet, Take by mouth. (Patient not taking: Reported on 08/24/2023), Disp: , Rfl:    aspirin  EC 81 MG tablet, Take 1 tablet (81 mg total) by mouth daily. Swallow whole., Disp: 150 tablet, Rfl: 1   atorvastatin (LIPITOR) 40 MG tablet, Take by mouth., Disp: , Rfl:    cyanocobalamin  (VITAMIN B12) 1000 MCG/ML injection, Inject into the muscle., Disp: , Rfl:    FLUoxetine  (PROZAC ) 20 MG capsule, Take by mouth., Disp: , Rfl:    furosemide  (LASIX ) 20 MG tablet, Take 1 tablet (20 mg total) by mouth daily. (Patient not taking: Reported on 08/24/2023), Disp: 30 tablet, Rfl: 1   ipratropium-albuterol  (DUONEB) 0.5-2.5 (3) MG/3ML SOLN, Take 3 mLs by nebulization every 6 (six) hours as needed (wheezing, shortness of breath). (Patient not taking: Reported on 08/24/2023), Disp: 180 mL, Rfl: 1   levothyroxine  (SYNTHROID , LEVOTHROID) 125 MCG tablet, Take 125 mcg by mouth daily before breakfast., Disp: , Rfl:    lisinopril  (ZESTRIL ) 10 MG tablet, Take 1 tablet (10 mg total) by mouth daily., Disp: 30 tablet, Rfl: 2   metoprolol  succinate (TOPROL -XL) 25 MG 24 hr tablet, Take 1 tablet (25 mg total) by  mouth daily. (Patient not taking: Reported on 08/24/2023), Disp: 30 tablet, Rfl: 2   metoprolol  tartrate (LOPRESSOR ) 25 MG tablet, Take by mouth., Disp: , Rfl:    midodrine (PROAMATINE) 5 MG tablet, Take 1 tablet by mouth 2 (two) times daily., Disp: , Rfl:    ONETOUCH ULTRA TEST test strip, USE 1 EACH (1 STRIP TOTAL) ONCE DAILY USE AS INSTRUCTED., Disp: , Rfl:    oxybutynin  (DITROPAN  XL) 15 MG 24 hr tablet, Take 15 mg by mouth at bedtime., Disp: , Rfl:    tamsulosin (FLOMAX) 0.4 MG CAPS capsule, Take 0.4 mg by mouth daily after breakfast., Disp: , Rfl:   Past Medical History: Past Medical History:  Diagnosis Date   Actinic keratosis    Anemia    Cancer (HCC)    prostate cancer    COPD with emphysema (HCC)    Diabetes mellitus, type 2 (HCC)    Elevated lipids    Gout    STABLE   Heart murmur    History of kidney stones    History of prostate cancer    Hypertension    Hypothyroidism    Left hydrocele    Lymph edema    Lt leg   Lymphedema of left leg    OSA on CPAP    Right wrist fracture    INJURY 02-01-2014 PT FELL   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, ED&C   Urge urinary incontinence    Wears glasses    Wears hearing aid    BILATERAL    Tobacco Use: Social History   Tobacco Use  Smoking Status Former   Current packs/day: 0.00   Types: Cigarettes   Start date: 21   Quit date: 1990   Years since quitting: 35.3   Passive exposure: Past  Smokeless Tobacco Never    Labs: Review Flowsheet       Latest Ref Rng & Units 03/26/2021 05/06/2023  Labs for ITP Cardiac and Pulmonary Rehab  Hemoglobin A1c 4.8 - 5.6 % 6.0  5.9      Exercise Target Goals: Exercise Program Goal: Individual exercise prescription set using results from initial 6 min walk test and THRR while considering  patient's activity barriers and safety.   Exercise Prescription Goal: Initial exercise prescription builds to 30-45 minutes a day of aerobic activity, 2-3 days per week.   Home exercise guidelines will be given to patient during program as part of exercise prescription that the participant will acknowledge.   Education: Aerobic Exercise: - Group verbal and visual presentation on the components of exercise prescription. Introduces F.I.T.T principle from ACSM for exercise prescriptions.  Reviews F.I.T.T. principles of aerobic exercise including progression. Written material given at graduation.   Education: Resistance Exercise: - Group verbal and visual presentation on the components of exercise prescription. Introduces F.I.T.T principle from ACSM for exercise prescriptions  Reviews F.I.T.T. principles of resistance exercise including progression. Written material given at graduation.    Education: Exercise & Equipment Safety: - Individual verbal instruction and demonstration of equipment use and safety with use of the equipment. Flowsheet Row Cardiac Rehab from 07/07/2023 in [facility] Cardiac and Pulmonary Rehab  Date 07/07/23  Educator [facility]  Instruction Review Code 1- Verbalizes Understanding       Education: Exercise Physiology & General Exercise Guidelines: - Group verbal and written instruction with models to review the exercise physiology of the cardiovascular system and associated critical values. Provides general exercise guidelines with specific guidelines to those with heart or lung disease.    Education: Flexibility, Balance, Mind/Body Relaxation: - Group verbal and visual presentation with interactive activity on the components of exercise prescription. Introduces F.I.T.T principle from ACSM for exercise prescriptions. Reviews F.I.T.T. principles of flexibility and balance exercise training including progression. Also discusses the mind body connection.  Reviews various relaxation techniques to help reduce and manage stress (i.e. Deep breathing, progressive muscle relaxation, and visualization). Balance handout provided to take home. Written material given  at graduation.   Activity Barriers & Risk Stratification:  Activity Barriers & Cardiac Risk Stratification - 07/12/23 1554       Activity Barriers & Cardiac Risk Stratification   Activity Barriers Joint Problems;Other (comment)    Comments Hx R shoulder pain, rotator cuff surgery    Cardiac Risk Stratification High             6 Minute Walk:  6 Minute Walk     Row Name 07/12/23 1552         6 Minute Walk   Phase Initial     Distance 960 feet     Walk Time 6 minutes     # of Rest Breaks 0     MPH 1.82     METS 1.64     RPE 13     Perceived Dyspnea  0     VO2 Peak 5.75     Symptoms No  Resting HR 67 bpm     Resting BP 122/54     Resting Oxygen Saturation  100 %     Exercise Oxygen Saturation  during 6 min walk 96 %     Max Ex. HR 96 bpm     Max Ex. BP 128/56     2 Minute Post BP 120/54              Oxygen Initial Assessment:   Oxygen Re-Evaluation:   Oxygen Discharge (Final Oxygen Re-Evaluation):   Initial Exercise Prescription:  Initial Exercise Prescription - 07/12/23 1500       Date of Initial Exercise RX and Referring Provider   Date 07/12/23    Referring Provider Dr. Joetta Mustache, MD      Oxygen   Maintain Oxygen Saturation 88% or higher      Recumbant Bike   Level 1    RPM 50    Watts 15    Minutes 15    METs 1.64      NuStep   Level 1    SPM 80    Minutes 15    METs 1.64      Track   Laps 16    Minutes 15    METs 1.87      Prescription Details   Frequency (times per week) 3    Duration Progress to 30 minutes of continuous aerobic without signs/symptoms of physical distress      Intensity   THRR 40-80% of Max Heartrate 96-126    Ratings of Perceived Exertion 11-13    Perceived Dyspnea 0-4      Progression   Progression Continue to progress workloads to maintain intensity without signs/symptoms of physical distress.      Resistance Training   Training Prescription Yes    Weight 4 lb    Reps 10-15              Perform Capillary Blood Glucose checks as needed.  Exercise Prescription Changes:   Exercise Prescription Changes     Row Name 07/12/23 1500 07/29/23 1800 08/12/23 0700 08/24/23 0800 09/08/23 0800     Response to Exercise   Blood Pressure (Admit) 122/54 106/60 124/70 112/60 122/68   Blood Pressure (Exercise) 128/56 140/64 132/70 126/58 --   Blood Pressure (Exit) 120/54 116/50 118/62 114/54 128/70   Heart Rate (Admit) 67 bpm 65 bpm 55 bpm 69 bpm 68 bpm   Heart Rate (Exercise) 96 bpm 92 bpm 98 bpm 108 bpm 107 bpm   Heart Rate (Exit) 72 bpm 74 bpm 75 bpm 61 bpm 69 bpm   Oxygen Saturation (Admit) 100 % -- -- -- --   Oxygen Saturation (Exercise) 96 % -- -- -- --   Rating of Perceived Exertion (Exercise) 13 13 14 13 14    Perceived Dyspnea (Exercise) 0 -- -- -- --   Symptoms none none none none none   Comments Results first 2 weeks of exericse -- -- --   Duration -- Progress to 30 minutes of  aerobic without signs/symptoms of physical distress Progress to 30 minutes of  aerobic without signs/symptoms of physical distress Continue with 30 min of aerobic exercise without signs/symptoms of physical distress. Continue with 30 min of aerobic exercise without signs/symptoms of physical distress.   Intensity -- THRR unchanged THRR unchanged THRR unchanged THRR unchanged     Progression   Progression -- Continue to progress workloads to maintain intensity without signs/symptoms of physical distress. Continue to progress workloads  to maintain intensity without signs/symptoms of physical distress. Continue to progress workloads to maintain intensity without signs/symptoms of physical distress. Continue to progress workloads to maintain intensity without signs/symptoms of physical distress.   Average METs -- 2.79 2.7 2.7 2.67     Resistance Training   Training Prescription -- Yes Yes Yes Yes   Weight -- 4 lb 4 lb 4 lb 4 lb   Reps -- 10-15 10-15 10-15 10-15     Interval Training    Interval Training -- No No No No     Recumbant Bike   Level -- 2 2.2 2 1    Watts -- 15 15 15 15    Minutes -- 15 15 15 15    METs -- 2.56 2.57 2.57 2.55     NuStep   Level -- 3 3 3 4    Minutes -- 15 15 15 15    METs -- 3 3.4 3.1 3.6     Biostep-RELP   Level -- -- -- 3 2   Minutes -- -- -- 15 15   METs -- -- -- 3 3     Track   Laps -- 12 25 27 25    Minutes -- 15 15 15 15    METs -- 1.65 2.36 2.47 2.36     Oxygen   Maintain Oxygen Saturation -- 88% or higher 88% or higher 88% or higher 88% or higher    Row Name 09/13/23 1100             Home Exercise Plan   Plans to continue exercise at Home (comment)       Frequency Add 2 additional days to program exercise sessions.       Initial Home Exercises Provided 09/13/23         Oxygen   Maintain Oxygen Saturation 88% or higher                Exercise Comments:   Exercise Comments     Row Name 07/19/23 1132           Exercise Comments First full day of exercise!  Patient was oriented to gym and equipment including functions, settings, policies, and procedures.  Patient's individual exercise prescription and treatment plan were reviewed.  All starting workloads were established based on the results of the 6 minute walk test done at initial orientation visit.  The plan for exercise progression was also introduced and progression will be customized based on patient's performance and goals.                Exercise Goals and Review:   Exercise Goals     Row Name 07/12/23 1555             Exercise Goals   Increase Physical Activity Yes       Intervention Provide advice, education, support and counseling about physical activity/exercise needs.;Develop an individualized exercise prescription for aerobic and resistive training based on initial evaluation findings, risk stratification, comorbidities and participant's personal goals.       Expected Outcomes Short Term: Attend rehab on a regular basis to increase  amount of physical activity.;Long Term: Add in home exercise to make exercise part of routine and to increase amount of physical activity.;Long Term: Exercising regularly at least 3-5 days a week.       Increase Strength and Stamina Yes       Intervention Provide advice, education, support and counseling about physical activity/exercise needs.;Develop an individualized exercise prescription for aerobic and resistive  training based on initial evaluation findings, risk stratification, comorbidities and participant's personal goals.       Expected Outcomes Short Term: Perform resistance training exercises routinely during rehab and add in resistance training at home;Long Term: Improve cardiorespiratory fitness, muscular endurance and strength as measured by increased METs and functional capacity ( );Short Term: Increase workloads from initial exercise prescription for resistance, speed, and METs.       Able to understand and use rate of perceived exertion (RPE) scale Yes       Intervention Provide education and explanation on how to use RPE scale       Expected Outcomes Short Term: Able to use RPE daily in rehab to express subjective intensity level;Long Term:  Able to use RPE to guide intensity level when exercising independently       Able to understand and use Dyspnea scale Yes       Intervention Provide education and explanation on how to use Dyspnea scale       Expected Outcomes Short Term: Able to use Dyspnea scale daily in rehab to express subjective sense of shortness of breath during exertion;Long Term: Able to use Dyspnea scale to guide intensity level when exercising independently       Knowledge and understanding of Target Heart Rate Range (THRR) Yes       Intervention Provide education and explanation of THRR including how the numbers were predicted and where they are located for reference       Expected Outcomes Short Term: Able to state/look up THRR;Long Term: Able to use THRR to govern  intensity when exercising independently;Short Term: Able to use daily as guideline for intensity in rehab       Able to check pulse independently Yes       Intervention Provide education and demonstration on how to check pulse in carotid and radial arteries.;Review the importance of being able to check your own pulse for safety during independent exercise       Expected Outcomes Short Term: Able to explain why pulse checking is important during independent exercise;Long Term: Able to check pulse independently and accurately       Understanding of Exercise Prescription Yes       Intervention Provide education, explanation, and written materials on patient's individual exercise prescription       Expected Outcomes Short Term: Able to explain program exercise prescription;Long Term: Able to explain home exercise prescription to exercise independently                Exercise Goals Re-Evaluation :  Exercise Goals Re-Evaluation     Row Name 07/19/23 1132 07/29/23 1852 08/12/23 0731 08/24/23 0810 09/08/23 0824     Exercise Goal Re-Evaluation   Exercise Goals Review Able to understand and use rate of perceived exertion (RPE) scale;Able to understand and use Dyspnea scale;Knowledge and understanding of Target Heart Rate Range (THRR);Understanding of Exercise Prescription Increase Physical Activity;Increase Strength and Stamina;Understanding of Exercise Prescription Increase Physical Activity;Increase Strength and Stamina;Understanding of Exercise Prescription Increase Physical Activity;Increase Strength and Stamina;Understanding of Exercise Prescription Increase Physical Activity;Increase Strength and Stamina;Understanding of Exercise Prescription   Comments Reviewed RPE and dyspnea scale, THR and program prescription with pt today.  Pt voiced understanding and was given a copy of goals to take home. Dsean is off to a great start in the program. He was able to increase his level on the T5 nustep from 1  to 3. He was also able to increase to level 2 on  the recumbent bike. We will continue to monitor his progress in the program. Safal is doing well in rehab. He recently increased to 25 laps walked on the track. He also improved to level 2.2 on the recumbent bike and continues to work at level 3 on the T4 nustep. We will continue to monitor his progress in the program. Cotey is doing well in rehab. He recently increased to 27 laps walked on the track. He also began using the biostep at level 3 and continues to work at level 3 on the T4 nustep. We will continue to monitor his progress in the program. Kewan is doing well in rehab. He increased to level 4 on the T4 nustep. He decreased down to 25 laps on the track from 27. He also went down to level 1 on the recumbent bike and level 2 on the biostep. We will continue to monitor his progress in the program.   Expected Outcomes Short: Use RPE daily to regulate intensity. Long: Follow program prescription in THR. Short: Continue to follow exercise prescription. Long: Continue exercise to improve strength and stamina. Short: Continue to push for more laps on the track. Long: Continue exercise to improve strength and stamina. Short: Continue to progressively increase workloads. Long: Continue exercise to improve strength and stamina. Short: Push for more laps on the track to get back to 27 laps and increase to level 2 on the recumbent bike. Long: Continue exercise to improve strength and stamina.    Row Name 09/13/23 1137             Exercise Goal Re-Evaluation   Exercise Goals Review Increase Physical Activity;Increase Strength and Stamina;Able to understand and use rate of perceived exertion (RPE) scale;Able to understand and use Dyspnea scale;Knowledge and understanding of Target Heart Rate Range (THRR);Able to check pulse independently;Understanding of Exercise Prescription       Comments Reviewed home exercise with pt today.  Pt plans to do general property  maintenance such as push lawn mowing, and weed eating for exercise.  Reviewed THR, pulse, RPE, sign and symptoms, pulse oximetery and when to call 911 or MD.  Also discussed weather considerations and indoor options.  Pt voiced understanding.       Expected Outcomes Short: Continue to do yard work, implement more structured exercise such as walking. Long: Continue to exercise at home and in the program to improve strength and stamina.                Discharge Exercise Prescription (Final Exercise Prescription Changes):  Exercise Prescription Changes - 09/13/23 1100       Home Exercise Plan   Plans to continue exercise at Home (comment)    Frequency Add 2 additional days to program exercise sessions.    Initial Home Exercises Provided 09/13/23      Oxygen   Maintain Oxygen Saturation 88% or higher             Nutrition:  Target Goals: Understanding of nutrition guidelines, daily intake of sodium 1500mg , cholesterol 200mg , calories 30% from fat and 7% or less from saturated fats, daily to have 5 or more servings of fruits and vegetables.  Education: All About Nutrition: -Group instruction provided by verbal, written material, interactive activities, discussions, models, and posters to present general guidelines for heart healthy nutrition including fat, fiber, MyPlate, the role of sodium in heart healthy nutrition, utilization of the nutrition label, and utilization of this knowledge for meal planning. Follow up email sent as  well. Written material given at graduation.   Biometrics:  Pre Biometrics - 07/12/23 1555       Pre Biometrics   Height 5\' 7"  (1.702 m)    Weight 187 lb 11.2 oz (85.1 kg)    Waist Circumference 41 inches    Hip Circumference 40 inches    Waist to Hip Ratio 1.03 %    BMI (Calculated) 29.39    Single Leg Stand 3.18 seconds              Nutrition Therapy Plan and Nutrition Goals:  Nutrition Therapy & Goals - 07/12/23 1337       Nutrition  Therapy   Diet Car controlled, cardiac, Low Na    Protein (specify units) 80    Fiber 30 grams    Whole Grain Foods 3 servings    Saturated Fats 15 max. grams    Fruits and Vegetables 5 servings/day    Sodium 2 grams      Personal Nutrition Goals   Nutrition Goal Read labels and reduce sodium intake to below 2300mg . Ideally 1500mg  per day.    Personal Goal #2 Reduce saturated fat, less than 12g per day. Replace bad fats for more heart healthy fats.    Personal Goal #3 Eat 15-30gProtein and 30-60gCarbs at each meal.    Comments Patient drinking water and sweet tea sweetened with splenda. Usually eats a large dinner, a small snack for lunch and breakfast usually small. Patient and his wife had questions around types of salts, which is better. Spoke to how all salts are high in sodium and therefore should be limited/avoided when seasoning foods. Reviewed mediterranean diet handout, educated on types of fats, sources, and how to read labels. His wife reads labels often, and tries to keep sodium below 2000mg  per day. She also tries to limit saturated fat. Brainstormed several meals and snacks with foods he likes and will eat focusing on variety while keeping sodium and saturated fat controlled.      Intervention Plan   Intervention Prescribe, educate and counsel regarding individualized specific dietary modifications aiming towards targeted core components such as weight, hypertension, lipid management, diabetes, heart failure and other comorbidities.;Nutrition handout(s) given to patient.    Expected Outcomes Short Term Goal: Understand basic principles of dietary content, such as calories, fat, sodium, cholesterol and nutrients.;Short Term Goal: A plan has been developed with personal nutrition goals set during dietitian appointment.;Long Term Goal: Adherence to prescribed nutrition plan.             Nutrition Assessments:  MEDIFICTS Score Key: >=70 Need to make dietary changes  40-70 Heart  Healthy Diet <= 40 Therapeutic Level Cholesterol Diet  Flowsheet Row Cardiac Rehab from 07/21/2023 in [facility] Cardiac and Pulmonary Rehab  Picture Your Plate Total Score on Admission 72      Picture Your Plate Scores: <27 Unhealthy dietary pattern with much room for improvement. 41-50 Dietary pattern unlikely to meet recommendations for good health and room for improvement. 51-60 More healthful dietary pattern, with some room for improvement.  >60 Healthy dietary pattern, although there may be some specific behaviors that could be improved.    Nutrition Goals Re-Evaluation:  Nutrition Goals Re-Evaluation     Row Name 08/16/23 1151 09/10/23 1125           Goals   Current Weight -- 188 lb (85.3 kg)      Nutrition Goal Read labels and reduce sodium intake to below 2300mg . Ideally 1500mg  per day. Continue  to make lifestyle changes.      Comment Cari has issues with not being able to taste well so it is sometimes hard to eat. He reports that his wife keeps up with what he is eating and reads food labels and watches his sodium consumption very closely. He has good support from his wife with his nutrition needs. Kishaun states his wife is helping him with his diet and he feels like he is eating well.      Expected Outcome Short: continue to monitor sodium intake. Long: maintian heart healthy diet to help control cardiac risk factors. Short: maintain his current diet. Long: maintain a diet that adheres to his needs.               Nutrition Goals Discharge (Final Nutrition Goals Re-Evaluation):  Nutrition Goals Re-Evaluation - 09/10/23 1125       Goals   Current Weight 188 lb (85.3 kg)    Nutrition Goal Continue to make lifestyle changes.    Comment Salaam states his wife is helping him with his diet and he feels like he is eating well.    Expected Outcome Short: maintain his current diet. Long: maintain a diet that adheres to his needs.             Psychosocial: Target Goals:  Acknowledge presence or absence of significant depression and/or stress, maximize coping skills, provide positive support system. Participant is able to verbalize types and ability to use techniques and skills needed for reducing stress and depression.   Education: Stress, Anxiety, and Depression - Group verbal and visual presentation to define topics covered.  Reviews how body is impacted by stress, anxiety, and depression.  Also discusses healthy ways to reduce stress and to treat/manage anxiety and depression.  Written material given at graduation.   Education: Sleep Hygiene -Provides group verbal and written instruction about how sleep can affect your health.  Define sleep hygiene, discuss sleep cycles and impact of sleep habits. Review good sleep hygiene tips.    Initial Review & Psychosocial Screening:  Initial Psych Review & Screening - 07/07/23 1434       Initial Review   Current issues with Current Psychotropic Meds;Current Anxiety/Panic      Family Dynamics   Good Support System? Yes    Comments Tiodoro states that everyday life can give him anxiety but since surgery he is able to relax more. His wife and daughter are good for his support.      Barriers   Psychosocial barriers to participate in program There are no identifiable barriers or psychosocial needs.;The patient should benefit from training in stress management and relaxation.      Screening Interventions   Interventions Encouraged to exercise;To provide support and resources with identified psychosocial needs;Provide feedback about the scores to participant    Expected Outcomes Short Term goal: Utilizing psychosocial counselor, staff and physician to assist with identification of specific Stressors or current issues interfering with healing process. Setting desired goal for each stressor or current issue identified.;Long Term Goal: Stressors or current issues are controlled or eliminated.;Short Term goal: Identification  and review with participant of any Quality of Life or Depression concerns found by scoring the questionnaire.;Long Term goal: The participant improves quality of Life and PHQ9 Scores as seen by post scores and/or verbalization of changes             Quality of Life Scores:   Quality of Life - 07/21/23 1801  Quality of Life   Select Quality of Life      Quality of Life Scores   Health/Function Pre 24.19 %    Socioeconomic Pre 27.93 %    Psych/Spiritual Pre 26.64 %    Family Pre 15.5 %    GLOBAL Pre 24.19 %            Scores of 19 and below usually indicate a poorer quality of life in these areas.  A difference of  2-3 points is a clinically meaningful difference.  A difference of 2-3 points in the total score of the Quality of Life Index has been associated with significant improvement in overall quality of life, self-image, physical symptoms, and general health in studies assessing change in quality of life.  PHQ-9: Review Flowsheet       08/24/2023 07/12/2023  Depression screen PHQ 2/9  Decreased Interest 0 0  Down, Depressed, Hopeless 0 0  PHQ - 2 Score 0 0  Altered sleeping - 0  Tired, decreased energy - 1  Change in appetite - 1  Feeling bad or failure about yourself  - 0  Trouble concentrating - 0  Moving slowly or fidgety/restless - 0  Suicidal thoughts - 0  PHQ-9 Score - 2  Difficult doing work/chores - Not difficult at all   Interpretation of Total Score  Total Score Depression Severity:  1-4 = Minimal depression, 5-9 = Mild depression, 10-14 = Moderate depression, 15-19 = Moderately severe depression, 20-27 = Severe depression   Psychosocial Evaluation and Intervention:  Psychosocial Evaluation - 07/07/23 1436       Psychosocial Evaluation & Interventions   Interventions Encouraged to exercise with the program and follow exercise prescription;Relaxation education;Stress management education    Comments Bautista states that everyday life can give him  anxiety but since surgery he is able to relax more. His wife and daughter are good for his support.    Expected Outcomes Short: Attend [facility] stress management education to decrease stress. Long: Maintain exercise Post HeartTrack to keep stress at a minimum.    Continue Psychosocial Services  Follow up required by staff             Psychosocial Re-Evaluation:  Psychosocial Re-Evaluation     Row Name 08/16/23 1153 09/10/23 1137           Psychosocial Re-Evaluation   Current issues with Current Psychotropic Meds;Current Anxiety/Panic None Identified      Comments Connolly reports that he has no changes or concerns currently with sleep, stress, or mental health. All of these areas are pretty steady and he has good support from his wife. Patient reports no issues with their current mental states, sleep, stress, depression or anxiety. Will follow up with patient in a few weeks for any changes.      Expected Outcomes Short: continue to attend cardiac rehab to exercise for mental health benefits. Long: maintain good mental health routine and notify doctor of any changes or concerns. Short: Continue to exercise regularly to support mental health and notify staff of any changes. Long: maintain mental health and well being through teaching of rehab or prescribed medications independently.      Interventions Encouraged to attend Cardiac Rehabilitation for the exercise Encouraged to attend Cardiac Rehabilitation for the exercise      Continue Psychosocial Services  Follow up required by staff Follow up required by staff               Psychosocial Discharge (Final Psychosocial Re-Evaluation):  Psychosocial Re-Evaluation - 09/10/23 1137       Psychosocial Re-Evaluation   Current issues with None Identified    Comments Patient reports no issues with their current mental states, sleep, stress, depression or anxiety. Will follow up with patient in a few weeks for any changes.    Expected  Outcomes Short: Continue to exercise regularly to support mental health and notify staff of any changes. Long: maintain mental health and well being through teaching of rehab or prescribed medications independently.    Interventions Encouraged to attend Cardiac Rehabilitation for the exercise    Continue Psychosocial Services  Follow up required by staff             Vocational Rehabilitation: Provide vocational rehab assistance to qualifying candidates.   Vocational Rehab Evaluation & Intervention:   Education: Education Goals: Education classes will be provided on a variety of topics geared toward better understanding of heart health and risk factor modification. Participant will state understanding/return demonstration of topics presented as noted by education test scores.  Learning Barriers/Preferences:  Learning Barriers/Preferences - 07/07/23 1434       Learning Barriers/Preferences   Learning Barriers Sight    Learning Preferences None             General Cardiac Education Topics:  AED/CPR: - Group verbal and written instruction with the use of models to demonstrate the basic use of the AED with the basic ABC's of resuscitation.   Anatomy and Cardiac Procedures: - Group verbal and visual presentation and models provide information about basic cardiac anatomy and function. Reviews the testing methods done to diagnose heart disease and the outcomes of the test results. Describes the treatment choices: Medical Management, Angioplasty, or Coronary Bypass Surgery for treating various heart conditions including Myocardial Infarction, Angina, Valve Disease, and Cardiac Arrhythmias.  Written material given at graduation.   Medication Safety: - Group verbal and visual instruction to review commonly prescribed medications for heart and lung disease. Reviews the medication, class of the drug, and side effects. Includes the steps to properly store meds and maintain the  prescription regimen.  Written material given at graduation.   Intimacy: - Group verbal instruction through game format to discuss how heart and lung disease can affect sexual intimacy. Written material given at graduation..   Know Your Numbers and Heart Failure: - Group verbal and visual instruction to discuss disease risk factors for cardiac and pulmonary disease and treatment options.  Reviews associated critical values for Overweight/Obesity, Hypertension, Cholesterol, and Diabetes.  Discusses basics of heart failure: signs/symptoms and treatments.  Introduces Heart Failure Zone chart for action plan for heart failure.  Written material given at graduation.   Infection Prevention: - Provides verbal and written material to individual with discussion of infection control including proper hand washing and proper equipment cleaning during exercise session. Flowsheet Row Cardiac Rehab from 07/07/2023 in [facility] Cardiac and Pulmonary Rehab  Date 07/07/23  Educator [facility]  Instruction Review Code 1- Verbalizes Understanding       Falls Prevention: - Provides verbal and written material to individual with discussion of falls prevention and safety. Flowsheet Row Cardiac Rehab from 07/07/2023 in [facility] Cardiac and Pulmonary Rehab  Date 07/07/23  Educator [facility]  Instruction Review Code 1- Verbalizes Understanding       Other: -Provides group and verbal instruction on various topics (see comments)   Knowledge Questionnaire Score:  Knowledge Questionnaire Score - 07/21/23 1801       Knowledge Questionnaire Score   Pre  Score 21/26             Core Components/Risk Factors/Patient Goals at Admission:  Personal Goals and Risk Factors at Admission - 07/07/23 1433       Core Components/Risk Factors/Patient Goals on Admission    Weight Management Yes    Intervention Weight Management/Obesity: Establish reasonable short term and long term weight goals.;Weight Management: Provide education and  appropriate resources to help participant work on and attain dietary goals.;Weight Management: Develop a combined nutrition and exercise program designed to reach desired caloric intake, while maintaining appropriate intake of nutrient and fiber, sodium and fats, and appropriate energy expenditure required for the weight goal.    Expected Outcomes Short Term: Continue to assess and modify interventions until short term weight is achieved;Weight Loss: Understanding of general recommendations for a balanced deficit meal plan, which promotes 1-2 lb weight loss per week and includes a negative energy balance of [phone] kcal/d;Understanding recommendations for meals to include 15-35% energy as protein, 25-35% energy from fat, 35-60% energy from carbohydrates, less than 200mg  of dietary cholesterol, 20-35 gm of total fiber daily;Understanding of distribution of calorie intake throughout the day with the consumption of 4-5 meals/snacks    Diabetes Yes    Intervention Provide education about signs/symptoms and action to take for hypo/hyperglycemia.;Provide education about proper nutrition, including hydration, and aerobic/resistive exercise prescription along with prescribed medications to achieve blood glucose in normal ranges: Fasting glucose 65-99 mg/dL    Expected Outcomes Short Term: Participant verbalizes understanding of the signs/symptoms and immediate care of hyper/hypoglycemia, proper foot care and importance of medication, aerobic/resistive exercise and nutrition plan for blood glucose control.;Long Term: Attainment of HbA1C < 7%.    Heart Failure Yes    Intervention Provide a combined exercise and nutrition program that is supplemented with education, support and counseling about heart failure. Directed toward relieving symptoms such as shortness of breath, decreased exercise tolerance, and extremity edema.    Expected Outcomes Short term: Attendance in program 2-3 days a week with increased exercise  capacity. Reported lower sodium intake. Reported increased fruit and vegetable intake. Reports medication compliance.;Improve functional capacity of life;Short term: Daily weights obtained and reported for increase. Utilizing diuretic protocols set by physician.;Long term: Adoption of self-care skills and reduction of barriers for early signs and symptoms recognition and intervention leading to self-care maintenance.    Hypertension Yes    Intervention Provide education on lifestyle modifcations including regular physical activity/exercise, weight management, moderate sodium restriction and increased consumption of fresh fruit, vegetables, and low fat dairy, alcohol moderation, and smoking cessation.;Monitor prescription use compliance.    Expected Outcomes Short Term: Continued assessment and intervention until BP is < 140/29mm HG in hypertensive participants. < 130/25mm HG in hypertensive participants with diabetes, heart failure or chronic kidney disease.;Long Term: Maintenance of blood pressure at goal levels.             Education:Diabetes - Individual verbal and written instruction to review signs/symptoms of diabetes, desired ranges of glucose level fasting, after meals and with exercise. Acknowledge that pre and post exercise glucose checks will be done for 3 sessions at entry of program. Flowsheet Row Cardiac Rehab from 07/07/2023 in [facility] Cardiac and Pulmonary Rehab  Date 07/07/23  Educator [facility]  Instruction Review Code 1- Verbalizes Understanding       Core Components/Risk Factors/Patient Goals Review:   Goals and Risk Factor Review     Row Name 08/16/23 1149 09/10/23 1143  Core Components/Risk Factors/Patient Goals Review   Personal Goals Review Diabetes;Heart Failure;Hypertension Other      Review Xain reports that he has a good morning routine of monitoring weight, blood pressure, and blood sugar. He is very consistent with monitoring and takes all his meds as  prescirbed by his doctors. He reports no concerns with his levels in these areas currently but will continue to monitor and report any changes or concerns to his doctor. Ngawang states he is feeling better and does not have as much shoulder pain. He states having more energy and he does not have to have his retinal surgery. He does not have any questions about any of his medications.      Expected Outcomes Short: continue routine of monitoring BP, blood sugar, and weight. Long: control cardiac risk factors long term. 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               Core Components/Risk Factors/Patient Goals at Discharge (Final Review):   Goals and Risk Factor Review - 09/10/23 1143       Core Components/Risk Factors/Patient Goals Review   Personal Goals Review Other    Review Copen states he is feeling better and does not have as much shoulder pain. He states having more energy and he does not have to have his retinal surgery. He does not have any questions about any of his medications.    Expected Outcomes 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             ITP Comments:  ITP Comments     Row Name 07/07/23 1433 07/12/23 1548 07/19/23 1132 07/21/23 0758 08/18/23 1207   ITP Comments Virtual Visit completed. Patient informed on EP and RD appointment and 6 Minute walk test. Patient also informed of patient health questionnaires on My Chart. Patient Verbalizes understanding. Visit diagnosis can be found in [facility] 06/16/2023. Completed and gym orientation. Initial ITP created and sent for review to Dr. Firman Hughes, Medical Director. First full day of exercise!  Patient was oriented to gym and equipment including functions, settings, policies, and procedures.  Patient's individual exercise prescription and treatment plan were reviewed.  All starting workloads were established based on the results of the 6 minute walk test done at initial orientation visit.  The  plan for exercise progression was also introduced and progression will be customized based on patient's performance and goals. 30 Day review completed. Medical Director ITP review done, changes made as directed, and signed approval by Medical Director. New to program. 30 Day review completed. Medical Director ITP review done, changes made as directed, and signed approval by Medical Director.    Row Name 09/15/23 1344           ITP Comments 30 Day review completed. Medical Director ITP review done, changes made as directed, and signed approval by Medical Director.                Comments:

## 2023-09-17 ENCOUNTER — Encounter: Admitting: *Deleted

## 2023-09-17 DIAGNOSIS — Z951 Presence of aortocoronary bypass graft: Secondary | ICD-10-CM

## 2023-09-17 NOTE — Progress Notes (Signed)
 Daily Session Note  Patient Details  Name: Christopher Pruitt MRN: [id number] Date of Birth: May 21, 1945 Referring Provider:   Flowsheet Row Cardiac Rehab from 07/12/2023 in [facility] Cardiac and Pulmonary Rehab  Referring Provider Dr. Joetta Mustache, MD       Encounter Date: 09/17/2023  Check In:  Session Check In - 09/17/23 1143       Check-In   Supervising physician immediately available to respond to emergencies See telemetry face sheet for immediately available ER MD    Location [facility]-Cardiac & Pulmonary Rehab    Staff Present Gideon Kussmaul RDN,LDN;Maxon Conetta BS, Exercise Physiologist;Nasire Reali, RN, BSN, CCRP;Joseph Hood RCP,RRT,BSRT    Virtual Visit No    Medication changes reported     No    Fall or balance concerns reported    No    Warm-up and Cool-down Performed on first and last piece of equipment    Resistance Training Performed Yes    VAD Patient? No    PAD/SET Patient? No      Pain Assessment   Currently in Pain? No/denies                Social History   Tobacco Use  Smoking Status Former   Current packs/day: 0.00   Types: Cigarettes   Start date: 8   Quit date: 1990   Years since quitting: 35.3   Passive exposure: Past  Smokeless Tobacco Never    Goals Met:  Independence with exercise equipment Exercise tolerated well No report of concerns or symptoms today  Goals Unmet:  Not Applicable  Comments: Pt able to follow exercise prescription today without complaint.  Will continue to monitor for progression.    Dr. Firman Hughes is Medical Director for [facility] Cardiac Rehabilitation.  Dr. Fuad Aleskerov is Medical Director for [facility] Pulmonary Rehabilitation.

## 2023-09-20 ENCOUNTER — Encounter: Admitting: *Deleted

## 2023-09-20 DIAGNOSIS — Z951 Presence of aortocoronary bypass graft: Secondary | ICD-10-CM

## 2023-09-20 NOTE — Progress Notes (Signed)
 Daily Session Note  Patient Details  Name: Christopher Pruitt MRN: [id number] Date of Birth: 1945-07-19 Referring Provider:   Flowsheet Row Cardiac Rehab from 07/12/2023 in [facility] Cardiac and Pulmonary Rehab  Referring Provider Dr. Joetta Mustache, MD       Encounter Date: 09/20/2023  Check In:  Session Check In - 09/20/23 1134       Check-In   Supervising physician immediately available to respond to emergencies See telemetry face sheet for immediately available ER MD    Location [facility]-Cardiac & Pulmonary Rehab    Staff Present Maud Sorenson, RN, BSN, CCRP;Joseph Hood RCP,RRT,BSRT;Maxon [employer], Exercise Physiologist;Kelly [employer], ACSM CEP, Exercise Physiologist;Margaret Best, MS, Exercise Physiologist    Virtual Visit No    Medication changes reported     No    Fall or balance concerns reported    No    Warm-up and Cool-down Performed on first and last piece of equipment    Resistance Training Performed Yes    VAD Patient? No    PAD/SET Patient? No      Pain Assessment   Currently in Pain? No/denies                Social History   Tobacco Use  Smoking Status Former   Current packs/day: 0.00   Types: Cigarettes   Start date: 60   Quit date: 1990   Years since quitting: 35.3   Passive exposure: Past  Smokeless Tobacco Never    Goals Met:  Independence with exercise equipment Exercise tolerated well No report of concerns or symptoms today  Goals Unmet:  Not Applicable  Comments: Pt able to follow exercise prescription today without complaint.  Will continue to monitor for progression.    Dr. Firman Hughes is Medical Director for [facility] Cardiac Rehabilitation.  Dr. Fuad Aleskerov is Medical Director for [facility] Pulmonary Rehabilitation.

## 2023-09-22 ENCOUNTER — Encounter: Admitting: *Deleted

## 2023-09-22 DIAGNOSIS — Z951 Presence of aortocoronary bypass graft: Secondary | ICD-10-CM | POA: Diagnosis not present

## 2023-09-22 NOTE — Progress Notes (Signed)
 Daily Session Note  Patient Details  Name: CROSS GOLDBECK MRN: [id number] Date of Birth: 1945-09-28 Referring Provider:   Flowsheet Row Cardiac Rehab from 07/12/2023 in [facility] Cardiac and Pulmonary Rehab  Referring Provider Dr. Joetta Mustache, MD       Encounter Date: 09/22/2023  Check In:  Session Check In - 09/22/23 1157       Check-In   Supervising physician immediately available to respond to emergencies See telemetry face sheet for immediately available ER MD    Location [facility]-Cardiac & Pulmonary Rehab    Staff Present Maud Sorenson, RN, BSN, CCRP;Joseph Hood RCP,RRT,BSRT;Maxon [employer] BS, Exercise Physiologist;Noah Tickle, BS, Exercise Physiologist    Virtual Visit No    Medication changes reported     No    Fall or balance concerns reported    No    Warm-up and Cool-down Performed on first and last piece of equipment    Resistance Training Performed Yes    VAD Patient? No    PAD/SET Patient? No      Pain Assessment   Currently in Pain? No/denies                Social History   Tobacco Use  Smoking Status Former   Current packs/day: 0.00   Types: Cigarettes   Start date: 83   Quit date: 1990   Years since quitting: 35.3   Passive exposure: Past  Smokeless Tobacco Never    Goals Met:  Independence with exercise equipment Exercise tolerated well No report of concerns or symptoms today  Goals Unmet:  Not Applicable  Comments: Pt able to follow exercise prescription today without complaint.  Will continue to monitor for progression.    Dr. Firman Hughes is Medical Director for [facility] Cardiac Rehabilitation.  Dr. Fuad Aleskerov is Medical Director for [facility] Pulmonary Rehabilitation.

## 2023-09-23 ENCOUNTER — Inpatient Hospital Stay (HOSPITAL_BASED_OUTPATIENT_CLINIC_OR_DEPARTMENT_OTHER): Admitting: Oncology

## 2023-09-23 ENCOUNTER — Encounter: Payer: Self-pay | Admitting: Oncology

## 2023-09-23 ENCOUNTER — Inpatient Hospital Stay

## 2023-09-23 ENCOUNTER — Inpatient Hospital Stay: Attending: Oncology

## 2023-09-23 VITALS — BP 134/70 | HR 69 | Temp 97.7°F | Resp 18 | Wt 185.9 lb

## 2023-09-23 DIAGNOSIS — N184 Chronic kidney disease, stage 4 (severe): Secondary | ICD-10-CM | POA: Diagnosis present

## 2023-09-23 DIAGNOSIS — N1832 Chronic kidney disease, stage 3b: Secondary | ICD-10-CM

## 2023-09-23 DIAGNOSIS — D631 Anemia in chronic kidney disease: Secondary | ICD-10-CM | POA: Diagnosis present

## 2023-09-23 DIAGNOSIS — D472 Monoclonal gammopathy: Secondary | ICD-10-CM | POA: Diagnosis not present

## 2023-09-23 LAB — CBC (CANCER CENTER ONLY)
HCT: 26 % — ABNORMAL LOW (ref 39.0–52.0)
Hemoglobin: 8.6 g/dL — ABNORMAL LOW (ref 13.0–17.0)
MCH: 29.8 pg (ref 26.0–34.0)
MCHC: 33.1 g/dL (ref 30.0–36.0)
MCV: 90 fL (ref 80.0–100.0)
Platelet Count: 121 10*3/uL — ABNORMAL LOW (ref 150–400)
RBC: 2.89 MIL/uL — ABNORMAL LOW (ref 4.22–5.81)
RDW: 15.5 % (ref 11.5–15.5)
WBC Count: 3.6 10*3/uL — ABNORMAL LOW (ref 4.0–10.5)
nRBC: 0 % (ref 0.0–0.2)

## 2023-09-23 MED ORDER — EPOETIN ALFA-EPBX 40000 UNIT/ML IJ SOLN
40000.0000 [IU] | INTRAMUSCULAR | Status: DC
Start: 2023-09-23 — End: 2023-09-23
  Administered 2023-09-23: 40000 [IU] via SUBCUTANEOUS
  Filled 2023-09-23: qty 1

## 2023-09-23 NOTE — Assessment & Plan Note (Signed)
 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 Previous labs were reviewed and discussed with patient. Lab Results  Component Value Date   HGB 8.6 (L) 09/23/2023   TIBC 325 08/24/2023   IRONPCTSAT 20 08/24/2023   FERRITIN 364 (H) 08/24/2023    Ferritin is >300, at goal. Recommend Retacrit  40,000 unit monthly.

## 2023-09-23 NOTE — Assessment & Plan Note (Addendum)
 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 Less frequently, these patients progress to AL amyloidosis, light chain deposition disease, or another lymphoproliferative disorder. For now I recommend observation. Check SPEP and light chain ratio every 6 months.

## 2023-09-23 NOTE — Progress Notes (Signed)
 Hematology/Oncology Consult note [phone] [phone]        REFERRING PROVIDER: Sari Cunning, MD   CHIEF COMPLAINTS/REASON FOR VISIT:  Evaluation of anemia.    ASSESSMENT & PLAN:   Anemia in chronic kidney disease (CKD) Anemia is likely secondary to chronic kidney disease. Previous labs were reviewed and discussed with patient. Lab Results  Component Value Date   HGB 8.6 (L) 09/23/2023   TIBC 325 08/24/2023   IRONPCTSAT 20 08/24/2023   FERRITIN 364 (H) 08/24/2023    Ferritin is >300, at goal. Recommend Retacrit  40,000 unit monthly.   CKD (chronic kidney disease) stage 3, GFR 30-59 ml/min (HCC) Encourage oral hydration and avoid nephrotoxins.    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 Less frequently, these patients progress to AL amyloidosis, light chain deposition disease, or another lymphoproliferative disorder. For now I recommend observation. Check SPEP and light chain ratio every 6 months.     Orders Placed This Encounter  Procedures   CBC with Differential (Cancer Center Only)    Standing Status:   Future    Expected Date:   12/24/2023    Expiration Date:   09/22/2024   Iron and TIBC    Standing Status:   Future    Expected Date:   12/24/2023    Expiration Date:   09/22/2024   Ferritin    Standing Status:   Future    Expected Date:   12/24/2023    Expiration Date:   09/22/2024   Retic Panel    Standing Status:   Future    Expected Date:   12/24/2023    Expiration Date:   09/22/2024   Hemoglobin and Hematocrit (Cancer Center Only)    Standing Status:    Future    Expected Date:   10/21/2023    Expiration Date:   09/22/2024   Miscellaneous [facility] test (send-out)    Standing Status:   Future    Expected Date:   10/21/2023    Expiration Date:   09/22/2024    Test name / description::   NT-pro BNP test # [id number]   Hemoglobin and Hematocrit (Cancer Center Only)    Standing Status:   Future    Expected Date:   11/18/2023    Expiration Date:   09/22/2024   Follow-up in 4 weeks All questions were answered. The patient knows to call the clinic with any problems, questions or concerns.  Christopher Forbes, MD, PhD [facility] Hematology Oncology 09/23/2023   HISTORY OF PRESENTING ILLNESS:   Christopher Pruitt is a  78 y.o.  male with PMH listed below was seen in consultation at the request of  Christopher Cunning, MD  for evaluation of anemia.   05/06/2023 - 05/10/2023 patient was hospitalized at [facility] due to acute respiratory failure with hypoxia, non-ST elevated myocardial infarction.  Patient was treated with heparin  drip.  Cardiac cath showed diffuse disease.  Patient was also treated for atypical pneumonia. January 2025, patient followed up with cardiology and was admitted for optimization prior to bypass surgery.  He was found to have severe anemia and received IV  Venofer 2 50 mg daily x 4 doses as well as Retacrit  40,000 unit x 12 June 2023, patient underwent coronary artery bypass graft x 3 at [facility]. He received a blood transfusion during this hospitalization due to low hemoglobin levels, which were as low as 6.6 g/dL.  He recalls having multiple blood draws during his hospital stay, which he believes contributed to his anemia.  His most recent hemoglobin level was 8 g/dL.  He has a history of a heart attack in December, followed by a planned triple bypass surgery in February. He has chronic kidney disease stage 4, which was present before his recent heart surgery. This condition may affect erythropoietin production, impacting his anemia  management.  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   INTERVAL HISTORY Christopher Pruitt is a 78 y.o. male who has above history reviewed by me today presents for follow up visit for anemia.  He received retacrit  4 weeks ago and tolerated well.  Cold feeling, slightly improved.    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, ED&C   Urge urinary incontinence    Wears glasses    Wears hearing aid    BILATERAL    SURGICAL HISTORY: Past Surgical History:  Procedure Laterality Date   CARPAL TUNNEL RELEASE Left 10/25/2018   Procedure: CARPAL TUNNEL RELEASE ENDOSCOPIC LEFT, DIABETIC, SLEEPAPNEA;  Surgeon: Elner Hahn, MD;  Location: [facility] ORS;  Service: Orthopedics;  Laterality: Left;   CARPAL TUNNEL RELEASE Right 02/01/2019   Procedure: CARPAL TUNNEL RELEASE ENDOSCOPIC;  Surgeon: Elner Hahn, MD;  Location: [facility] ORS;  Service: Orthopedics;  Laterality: Right;   colonoscopy with polypectomy     COLONOSCOPY WITH PROPOFOL  N/A 07/27/2018   Procedure: COLONOSCOPY WITH PROPOFOL ;  Surgeon: Toledo, Alphonsus Jeans, MD;  Location: [facility] ENDOSCOPY;  Service: Gastroenterology;  Laterality: N/A;   heart bypass surgery   06/17/2023   HYDROCELE EXCISION Left 02/11/2015   Procedure: LEFT HYDROCELECTOMY ADULT;  Surgeon: Bart Born, MD;  Location: [facility];  Service: Urology;  Laterality: Left;   INGUINAL HERNIA REPAIR Bilateral 04/18/2021   Procedure: LAPAROSCOPIC BILATERAL  HERNIA REPAIR WITH MESH, TRANSABDOMINAL PLANE BLOCK, LEFT INCARCARATED SCROTAL HERNIA, RIGHT INGUINAL HERNIA REPAIR;  Surgeon: Candyce Champagne, MD;  Location: [facility];  Service: General;  Laterality: Bilateral;   JOINT REPLACEMENT     Rt knee   LEFT HEART CATH AND CORONARY ANGIOGRAPHY N/A 05/10/2023   Procedure: LEFT HEART  CATH AND CORONARY ANGIOGRAPHY;  Surgeon: Percival Brace, MD;  Location: [facility] INVASIVE CV LAB;  Service: Cardiovascular;  Laterality: N/A;   PARTIAL KNEE ARTHROPLASTY Right 2012   PLACEMENT GOLD STUDS IN PROSTATE  2014   [place]   for [facility] (prostate cancer)   SHOULDER OPEN ROTATOR CUFF REPAIR Left 2010   TONSILLECTOMY  as child    SOCIAL HISTORY: Social History   Socioeconomic History   Marital status: Married    Spouse name: Not on file   Number of children: Not on file   Years of education: Not on file   Highest education level: Not on file  Occupational History   Not on file  Tobacco Use   Smoking status: Former    Current packs/day: 0.00    Types: Cigarettes    Start date: 7    Quit date: 1990    Years since quitting: 35.3    Passive exposure: Past   Smokeless tobacco: Never  Vaping Use   Vaping status: Never Used  Substance and Sexual Activity   Alcohol use: No   Drug use: No   Sexual activity: Not on file  Other Topics Concern   Not on file  Social History Narrative   Not on file   Social Drivers of Health   Financial Resource Strain: Low Risk  (08/19/2023)   Received from [facility]   Overall Financial Resource Strain (CARDIA)    Difficulty of Paying Living Expenses: Not hard at all  Food Insecurity: No Food Insecurity  (08/24/2023)   Hunger Vital Sign    Worried About Running Out of Food in the Last Year: Never true    Ran Out of Food in the Last Year: Never true  Transportation Needs: No Transportation Needs (08/24/2023)   PRAPARE - Administrator, Civil Service (Medical): No    Lack of Transportation (Non-Medical): No  Recent Concern: Transportation Needs - Unmet Transportation Needs (06/24/2023)   Received from [facility] - Transportation    In the past 12 months, has lack of transportation kept you from medical appointments or from getting medications?: Yes    Lack of Transportation (Non-Medical): Yes  Physical Activity: Not on file  Stress: Not on file  Social Connections: Not on file  Intimate Partner Violence: Not At Risk (05/08/2023)   Humiliation, Afraid, Rape, and Kick questionnaire    Fear of Current or Ex-Partner: No    Emotionally Abused: No    Physically Abused: No    Sexually Abused: No    FAMILY HISTORY: Family History  Problem Relation Age of Onset   Colon cancer Mother    Heart attack Father     ALLERGIES:  is allergic to amoxicillin-pot clavulanate, oxycodone, clindamycin /lincomycin, nalfon [fenoprofen calcium ], and robaxin [methocarbamol].  MEDICATIONS:  Current Outpatient Medications  Medication Sig Dispense Refill   acetaminophen  (TYLENOL ) 650 MG CR tablet Take 650 mg by mouth every 8 (eight) hours as needed for pain.     albuterol  (VENTOLIN  HFA) 108 (90 Base) MCG/ACT inhaler Inhale 2 puffs into the lungs every 6 (six) hours as needed for wheezing or shortness of breath. 8 g 2   aspirin  EC 81 MG tablet Take 1 tablet (81 mg total) by mouth daily. Swallow whole. 150 tablet 1   atorvastatin (LIPITOR) 40 MG tablet Take by mouth.     cyanocobalamin  (VITAMIN B12) 1000 MCG/ML injection Inject into the muscle.     FLUoxetine  (PROZAC )  20 MG capsule Take by mouth.     levothyroxine  (SYNTHROID , LEVOTHROID) 125 MCG tablet Take 125 mcg by mouth  daily before breakfast.     lisinopril  (ZESTRIL ) 10 MG tablet Take 1 tablet (10 mg total) by mouth daily. 30 tablet 2   metoprolol  tartrate (LOPRESSOR ) 25 MG tablet Take by mouth.     midodrine (PROAMATINE) 5 MG tablet Take 1 tablet by mouth 2 (two) times daily.     ONETOUCH ULTRA TEST test strip USE 1 EACH (1 STRIP TOTAL) ONCE DAILY USE AS INSTRUCTED.     oxybutynin  (DITROPAN  XL) 15 MG 24 hr tablet Take 15 mg by mouth at bedtime.     tamsulosin (FLOMAX) 0.4 MG CAPS capsule Take 0.4 mg by mouth daily after breakfast.     amiodarone (PACERONE) 200 MG tablet Take by mouth. (Patient not taking: Reported on 09/23/2023)     furosemide  (LASIX ) 20 MG tablet Take 1 tablet (20 mg total) by mouth daily. (Patient not taking: Reported on 09/23/2023) 30 tablet 1   ipratropium-albuterol  (DUONEB) 0.5-2.5 (3) MG/3ML SOLN Take 3 mLs by nebulization every 6 (six) hours as needed (wheezing, shortness of breath). (Patient not taking: Reported on 07/07/2023) 180 mL 1   metoprolol  succinate (TOPROL -XL) 25 MG 24 hr tablet Take 1 tablet (25 mg total) by mouth daily. (Patient not taking: Reported on 09/23/2023) 30 tablet 2   No current facility-administered medications for this visit.    Review of Systems  Constitutional:  Positive for fatigue. Negative for appetite change, chills, fever and unexpected weight change.  HENT:   Negative for hearing loss and voice change.   Eyes:  Negative for eye problems and icterus.  Respiratory:  Negative for chest tightness, cough and shortness of breath.   Cardiovascular:  Negative for chest pain and leg swelling.  Gastrointestinal:  Negative for abdominal distention and abdominal pain.  Endocrine: Negative for hot flashes.       Feels cold  Genitourinary:  Negative for difficulty urinating, dysuria and frequency.   Musculoskeletal:  Negative for arthralgias.  Skin:  Negative for itching and rash.  Neurological:  Negative for light-headedness and numbness.  Hematological:   Negative for adenopathy. Does not bruise/bleed easily.  Psychiatric/Behavioral:  Negative for confusion.    PHYSICAL EXAMINATION: ECOG PERFORMANCE STATUS: 1 - Symptomatic but completely ambulatory Vitals:   09/23/23 1402  BP: 134/70  Pulse: 69  Resp: 18  Temp: 97.7 F (36.5 C)  SpO2: 96%   Filed Weights   09/23/23 1402  Weight: 185 lb 14.4 oz (84.3 kg)    Physical Exam Constitutional:      General: He is not in acute distress. HENT:     Head: Normocephalic and atraumatic.  Eyes:     General: No scleral icterus. Cardiovascular:     Rate and Rhythm: Normal rate and regular rhythm.     Heart sounds: Normal heart sounds.  Pulmonary:     Effort: Pulmonary effort is normal. No respiratory distress.     Breath sounds: No wheezing.  Abdominal:     General: Bowel sounds are normal. There is no distension.     Palpations: Abdomen is soft.  Musculoskeletal:        General: No deformity. Normal range of motion.     Cervical back: Normal range of motion and neck supple.     Left lower leg: Edema present.  Skin:    General: Skin is warm and dry.     Findings: No erythema or  rash.  Neurological:     Mental Status: He is alert and oriented to person, place, and time. Mental status is at baseline.     Cranial Nerves: No cranial nerve deficit.     Coordination: Coordination normal.  Psychiatric:        Mood and Affect: Mood normal.     LABORATORY DATA:  I have reviewed the data as listed    Latest Ref Rng & Units 09/23/2023    1:53 PM 08/24/2023   10:13 AM 05/10/2023    3:14 AM  CBC  WBC 4.0 - 10.5 K/uL 3.6  3.5  4.6   Hemoglobin 13.0 - 17.0 g/dL 8.6  8.0  9.9   Hematocrit 39.0 - 52.0 % 26.0  24.6  28.7   Platelets 150 - 400 K/uL 121  124  107       Latest Ref Rng & Units 08/24/2023   10:13 AM 05/09/2023    4:51 AM 05/08/2023    4:42 AM  CMP  Glucose 70 - 99 mg/dL  409  811   BUN 8 - 23 mg/dL  32  43   Creatinine 9.14 - 1.24 mg/dL  7.82  9.56   Sodium 213 - 145  mmol/L  136  135   Potassium 3.5 - 5.1 mmol/L  4.0  3.9   Chloride 98 - 111 mmol/L  102  101   CO2 22 - 32 mmol/L  25  24   Calcium  8.9 - 10.3 mg/dL  8.7  8.7   Total Protein 6.5 - 8.1 g/dL 6.7     Total Bilirubin 0.0 - 1.2 mg/dL 0.8     Alkaline Phos 38 - 126 U/L 80     AST 15 - 41 U/L 24     ALT 0 - 44 U/L 27         RADIOGRAPHIC STUDIES: I have personally reviewed the radiological images as listed and agreed with the findings in the report. No results found.

## 2023-09-23 NOTE — Assessment & Plan Note (Signed)
 Encourage oral hydration and avoid nephrotoxins.

## 2023-09-24 ENCOUNTER — Encounter: Admitting: *Deleted

## 2023-09-24 VITALS — Ht 67.0 in | Wt 186.4 lb

## 2023-09-24 DIAGNOSIS — Z951 Presence of aortocoronary bypass graft: Secondary | ICD-10-CM

## 2023-09-24 NOTE — Patient Instructions (Signed)
 Discharge Patient Instructions  Patient Details  Name: Christopher Pruitt MRN: [id number] Date of Birth: 06-27-1945 Referring Provider:  Sari Cunning, MD   Number of Visits: 36  Reason for Discharge:  Patient reached a stable level of exercise. Patient independent in their exercise. Patient has met program and personal goals.   Diagnosis:  S/P CABG x 3  Initial Exercise Prescription:  Initial Exercise Prescription - 07/12/23 1500       Date of Initial Exercise RX and Referring Provider   Date 07/12/23    Referring Provider Dr. Joetta Mustache, MD      Oxygen   Maintain Oxygen Saturation 88% or higher      Recumbant Bike   Level 1    RPM 50    Watts 15    Minutes 15    METs 1.64      NuStep   Level 1    SPM 80    Minutes 15    METs 1.64      Track   Laps 16    Minutes 15    METs 1.87      Prescription Details   Frequency (times per week) 3    Duration Progress to 30 minutes of continuous aerobic without signs/symptoms of physical distress      Intensity   THRR 40-80% of Max Heartrate 96-126    Ratings of Perceived Exertion 11-13    Perceived Dyspnea 0-4      Progression   Progression Continue to progress workloads to maintain intensity without signs/symptoms of physical distress.      Resistance Training   Training Prescription Yes    Weight 4 lb    Reps 10-15             Discharge Exercise Prescription (Final Exercise Prescription Changes):  Exercise Prescription Changes - 09/23/23 1400       Response to Exercise   Blood Pressure (Admit) 122/60    Blood Pressure (Exit) 146/70    Heart Rate (Admit) 59 bpm    Heart Rate (Exercise) 118 bpm    Heart Rate (Exit) 62 bpm    Rating of Perceived Exertion (Exercise) 13    Symptoms none    Duration Continue with 30 min of aerobic exercise without signs/symptoms of physical distress.    Intensity THRR unchanged      Progression   Progression Continue to progress workloads to maintain intensity  without signs/symptoms of physical distress.    Average METs 2.94      Resistance Training   Training Prescription Yes    Weight 4 lb    Reps 10-15      Interval Training   Interval Training No      Recumbant Bike   Level 2    Watts 15    Minutes 15    METs 3.6      NuStep   Level 5    Minutes 15    METs 3.6      Biostep-RELP   Level 1.5    Minutes 15    METs 2.1      Track   Laps 32    Minutes 15    METs 2.74      Home Exercise Plan   Plans to continue exercise at Home (comment)    Frequency Add 2 additional days to program exercise sessions.    Initial Home Exercises Provided 09/13/23      Oxygen   Maintain Oxygen Saturation 88% or higher  Functional Capacity:  6 Minute Walk     Row Name 07/12/23 1552 09/24/23 1115       6 Minute Walk   Phase Initial Discharge    Distance 960 feet 1190 feet    Distance % Change -- 24 %    Distance Feet Change -- 230 ft    Walk Time 6 minutes 6 minutes    # of Rest Breaks 0 0    MPH 1.82 2.25    METS 1.64 2.1    RPE 13 11    Perceived Dyspnea  0 0    VO2 Peak 5.75 7.36    Symptoms No No    Resting HR 67 bpm 70 bpm    Resting BP 122/54 122/60    Resting Oxygen Saturation  100 % 97 %    Exercise Oxygen Saturation  during 6 min walk 96 % 98 %    Max Ex. HR 96 bpm 98 bpm    Max Ex. BP 128/56 132/54    2 Minute Post BP 120/54 --            Nutrition & Weight - Outcomes:  Pre Biometrics - 07/12/23 1555       Pre Biometrics   Height 5\' 7"  (1.702 m)    Weight 187 lb 11.2 oz (85.1 kg)    Waist Circumference 41 inches    Hip Circumference 40 inches    Waist to Hip Ratio 1.03 %    BMI (Calculated) 29.39    Single Leg Stand 3.18 seconds             Post Biometrics - 09/24/23 1116        Post  Biometrics   Height 5\' 7"  (1.702 m)    Weight 186 lb 6.4 oz (84.6 kg)    Waist Circumference 41 inches    Hip Circumference 38 inches    Waist to Hip Ratio 1.08 %    BMI (Calculated) 29.19     Single Leg Stand 5.47 seconds             Nutrition:  Nutrition Therapy & Goals - 07/12/23 1337       Nutrition Therapy   Diet Car controlled, cardiac, Low Na    Protein (specify units) 80    Fiber 30 grams    Whole Grain Foods 3 servings    Saturated Fats 15 max. grams    Fruits and Vegetables 5 servings/day    Sodium 2 grams      Personal Nutrition Goals   Nutrition Goal Read labels and reduce sodium intake to below 2300mg . Ideally 1500mg  per day.    Personal Goal #2 Reduce saturated fat, less than 12g per day. Replace bad fats for more heart healthy fats.    Personal Goal #3 Eat 15-30gProtein and 30-60gCarbs at each meal.    Comments Patient drinking water and sweet tea sweetened with splenda. Usually eats a large dinner, a small snack for lunch and breakfast usually small. Patient and his wife had questions around types of salts, which is better. Spoke to how all salts are high in sodium and therefore should be limited/avoided when seasoning foods. Reviewed mediterranean diet handout, educated on types of fats, sources, and how to read labels. His wife reads labels often, and tries to keep sodium below 2000mg  per day. She also tries to limit saturated fat. Brainstormed several meals and snacks with foods he likes and will eat focusing on variety while keeping sodium and  saturated fat controlled.      Intervention Plan   Intervention Prescribe, educate and counsel regarding individualized specific dietary modifications aiming towards targeted core components such as weight, hypertension, lipid management, diabetes, heart failure and other comorbidities.;Nutrition handout(s) given to patient.    Expected Outcomes Short Term Goal: Understand basic principles of dietary content, such as calories, fat, sodium, cholesterol and nutrients.;Short Term Goal: A plan has been developed with personal nutrition goals set during dietitian appointment.;Long Term Goal: Adherence to prescribed  nutrition plan.

## 2023-09-24 NOTE — Progress Notes (Signed)
 Daily Session Note  Patient Details  Name: Christopher Pruitt MRN: [id number] Date of Birth: [birth year] Referring Provider:   Flowsheet Row Cardiac Rehab from 07/12/2023 in [facility] Cardiac [facility] Pul[facility] Re[facility]  Referring Provider Dr. Joetta Mustache, MD       Encounter Date: 09/24/2023  Check In:  Session Check In - 09/24/23 1113       Check-In   Supervising physician immediately available to respond to emergencies See telemetry face sheet for immediately available ER MD    Location [facility]-Cardiac & Pulmonary Rehab    Staff Present Maud Sorenson, RN, BSN, CCRP;Joseph Hood RCP,RRT,BSRT;Maxon Mahomet BS, Exercise Physiologist;Noah Tickle, BS, Exercise Physiologist    Virtual Visit No    Medication changes reported     No    Fall or balance concerns reported    No    Warm-up and Cool-down Performed on first and last piece of equipment    Resistance Training Performed Yes    VAD Patient? No    PAD/SET Patient? No      Pain Assessment   Currently in Pain? No/denies                Social History   Tobacco Use  Smoking Status Former   Current packs/day: 0.00   Types: Cigarettes   Start date: 61   Quit date: 1990   Years since quitting: 35.3   Passive exposure: Past  Smokeless Tobacco Never    Goals Met:  Independence with exercise equipment Exercise tolerated well No report of concerns or symptoms today  Goals Unmet:  Not Applicable  Comments: Pt able to follow exercise prescription today without complaint.  Will continue to monitor for progression.    Dr. Firman Hughes is Medical Director for [facility] Cardiac Rehabilitation.  Dr. Fuad Aleskerov is Medical Director for [facility] Pulmonary Rehabilitation.

## 2023-09-27 ENCOUNTER — Encounter: Admitting: *Deleted

## 2023-09-27 DIAGNOSIS — Z951 Presence of aortocoronary bypass graft: Secondary | ICD-10-CM | POA: Diagnosis not present

## 2023-09-27 NOTE — Progress Notes (Signed)
 Daily Session Note  Patient Details  Name: Christopher Pruitt MRN: [id number] Date of Birth: [birth year] Referring Provider:   Flowsheet Row Cardiac Rehab from 07/12/2023 in [facility] Cardiac and Pulmonary Rehab  Referring Provider Dr. Joetta Mustache, MD       Encounter Date: 09/27/2023  Check In:  Session Check In - 09/27/23 1120       Check-In   Supervising physician immediately available to respond to emergencies See telemetry face sheet for immediately available ER MD    Location [facility]-Cardiac & Pulmonary Rehab    Staff Present Maud Sorenson, RN, BSN, CCRP;Kelly Hayes BS, ACSM CEP, Exercise Physiologist;Maxon Conetta BS, Exercise Physiologist    Virtual Visit No    Medication changes reported     No    Fall or balance concerns reported    No    Warm-up and Cool-down Performed on first and last piece of equipment    Resistance Training Performed Yes    VAD Patient? No    PAD/SET Patient? No      Pain Assessment   Currently in Pain? No/denies                Social History   Tobacco Use  Smoking Status Former   Current packs/day: 0.00   Types: Cigarettes   Start date: 13   Quit date: 1990   Years since quitting: 35.4   Passive exposure: Past  Smokeless Tobacco Never    Goals Met:  Independence with exercise equipment Exercise tolerated well No report of concerns or symptoms today  Goals Unmet:  Not Applicable  Comments: Pt able to follow exercise prescription today without complaint.  Will continue to monitor for progression.    Dr. Firman Hughes is Medical Director for [facility] Cardiac Rehabilitation.  Dr. Fuad Aleskerov is Medical Director for [facility] Pulmonary Rehabilitation.

## 2023-09-29 ENCOUNTER — Encounter: Admitting: *Deleted

## 2023-09-29 DIAGNOSIS — Z951 Presence of aortocoronary bypass graft: Secondary | ICD-10-CM | POA: Diagnosis not present

## 2023-09-29 NOTE — Progress Notes (Signed)
 Daily Session Note  Patient Details  Name: OAKLEN THIAM MRN: [id number] Date of Birth: [birth year] Referring Provider:   Flowsheet Row Cardiac Rehab from 07/12/2023 in [facility] Cardiac and Pulmonary Rehab  Referring Provider Dr. Joetta Mustache, MD       Encounter Date: 09/29/2023  Check In:  Session Check In - 09/29/23 1659       Check-In   Supervising physician immediately available to respond to emergencies See telemetry face sheet for immediately available ER MD    Location [facility]-Cardiac & Pulmonary Rehab    Staff Present Maud Sorenson, RN, BSN, CCRP;Maxon Conetta BS, Exercise Physiologist;Noah Tickle, BS, Exercise Physiologist;Jason Martina Sledge RDN,LDN    Virtual Visit No    Medication changes reported     No    Fall or balance concerns reported    No    Warm-up and Cool-down Performed on first and last piece of equipment    Resistance Training Performed Yes    VAD Patient? No    PAD/SET Patient? No      Pain Assessment   Currently in Pain? No/denies                Social History   Tobacco Use  Smoking Status Former   Current packs/day: 0.00   Types: Cigarettes   Start date: 89   Quit date: 1990   Years since quitting: 35.4   Passive exposure: Past  Smokeless Tobacco Never    Goals Met:  Independence with exercise equipment Exercise tolerated well No report of concerns or symptoms today  Goals Unmet:  Not Applicable  Comments: Pt able to follow exercise prescription today without complaint.  Will continue to monitor for progression.    Dr. Firman Hughes is Medical Director for [facility] Cardiac Rehabilitation.  Dr. Fuad Aleskerov is Medical Director for [facility] Pulmonary Rehabilitation.

## 2023-10-01 ENCOUNTER — Encounter: Admitting: *Deleted

## 2023-10-01 DIAGNOSIS — Z951 Presence of aortocoronary bypass graft: Secondary | ICD-10-CM

## 2023-10-01 NOTE — Progress Notes (Signed)
 Daily Session Note  Patient Details  Name: NAASIR CARREIRA MRN: [id number] Date of Birth: 05-06-1946 Referring Provider:   Flowsheet Row Cardiac Rehab from 07/12/2023 in [facility] Cardiac and Pulmonary Rehab  Referring Provider Dr. Joetta Mustache, MD       Encounter Date: 10/01/2023  Check In:  Session Check In - 10/01/23 1114       Check-In   Supervising physician immediately available to respond to emergencies See telemetry face sheet for immediately available ER MD    Location [facility]-Cardiac & Pulmonary Rehab    Staff Present Maud Sorenson, RN, BSN, CCRP;Joseph Hood RCP,RRT,BSRT;Maxon [place] BS, Exercise Physiologist;Noah Tickle, BS, Exercise Physiologist    Virtual Visit No    Medication changes reported     No    Fall or balance concerns reported    No    Warm-up and Cool-down Performed on first and last piece of equipment    Resistance Training Performed Yes    VAD Patient? No    PAD/SET Patient? No      Pain Assessment   Currently in Pain? No/denies                Social History   Tobacco Use  Smoking Status Former   Current packs/day: 0.00   Types: Cigarettes   Start date: 42   Quit date: 1990   Years since quitting: 35.4   Passive exposure: Past  Smokeless Tobacco Never    Goals Met:  Independence with exercise equipment Exercise tolerated well No report of concerns or symptoms today  Goals Unmet:  Not Applicable  Comments: Pt able to follow exercise prescription today without complaint.  Will continue to monitor for progression.    Dr. Firman Hughes is Medical Director for [facility] Cardiac Rehabilitation.  Dr. Fuad Aleskerov is Medical Director for [facility] Pulmonary Rehabilitation.

## 2023-10-06 ENCOUNTER — Encounter: Admitting: *Deleted

## 2023-10-06 DIAGNOSIS — Z951 Presence of aortocoronary bypass graft: Secondary | ICD-10-CM

## 2023-10-06 NOTE — Progress Notes (Signed)
 Daily Session Note  Patient Details  Name: Christopher Pruitt MRN: [id number] Date of Birth: 06-May-1946 Referring Provider:   Flowsheet Row Cardiac Rehab from 07/12/2023 in [facility] Cardiac and Pulmonary Rehab  Referring Provider Dr. Joetta Mustache, MD       Encounter Date: 10/06/2023  Check In:  Session Check In - 10/06/23 1119       Check-In   Supervising physician immediately available to respond to emergencies See telemetry face sheet for immediately available ER MD    Location [facility]-Cardiac & Pulmonary Rehab    Staff Present Maud Sorenson, RN, BSN, CCRP;Joseph Hood RCP,RRT,BSRT;Maxon [place] BS, Exercise Physiologist;Noah Tickle, BS, Exercise Physiologist    Virtual Visit No    Medication changes reported     No    Fall or balance concerns reported    No    Warm-up and Cool-down Performed on first and last piece of equipment    Resistance Training Performed Yes    VAD Patient? No    PAD/SET Patient? No      Pain Assessment   Currently in Pain? No/denies                Social History   Tobacco Use  Smoking Status Former   Current packs/day: 0.00   Types: Cigarettes   Start date: 74   Quit date: 1990   Years since quitting: 35.4   Passive exposure: Past  Smokeless Tobacco Never    Goals Met:  Independence with exercise equipment Exercise tolerated well No report of concerns or symptoms today  Goals Unmet:  Not Applicable  Comments: Pt able to follow exercise prescription today without complaint.  Will continue to monitor for progression.    Dr. Firman Hughes is Medical Director for [facility] Cardiac Rehabilitation.  Dr. Fuad Aleskerov is Medical Director for [facility] Pulmonary Rehabilitation.

## 2023-10-08 ENCOUNTER — Encounter: Admitting: *Deleted

## 2023-10-08 DIAGNOSIS — Z951 Presence of aortocoronary bypass graft: Secondary | ICD-10-CM

## 2023-10-08 NOTE — Progress Notes (Signed)
 Daily Session Note  Patient Details  Name: Christopher Pruitt MRN: [id number] Date of Birth: [birth year] Referring Provider:   Flowsheet Row Cardiac Rehab from 07/12/2023 in [facility] Cardiac and Pulmonary Rehab  Referring Provider Dr. Joetta Mustache, MD       Encounter Date: 10/08/2023  Check In:  Session Check In - 10/08/23 1136       Check-In   Supervising physician immediately available to respond to emergencies See telemetry face sheet for immediately available ER MD    Location [facility]-Cardiac & Pulmonary Rehab    Staff Present Maud Sorenson, RN, BSN, CCRP;Joseph Hood RCP,RRT,BSRT;Maxon [place] BS, Exercise Physiologist;Noah Tickle, BS, Exercise Physiologist    Virtual Visit No    Medication changes reported     No    Fall or balance concerns reported    No    Warm-up and Cool-down Performed on first and last piece of equipment    Resistance Training Performed Yes    VAD Patient? No    PAD/SET Patient? No      Pain Assessment   Currently in Pain? No/denies                Social History   Tobacco Use  Smoking Status Former   Current packs/day: 0.00   Types: Cigarettes   Start date: 9   Quit date: 1990   Years since quitting: 35.4   Passive exposure: Past  Smokeless Tobacco Never    Goals Met:  Independence with exercise equipment Exercise tolerated well No report of concerns or symptoms today  Goals Unmet:  Not Applicable  Comments: Pt able to follow exercise prescription today without complaint.    Christopher Pruitt graduated today from  rehab with 36 sessions completed.  Details of the patient's exercise prescription and what He needs to do in order to continue the prescription and progress were discussed with patient.  Patient was given a copy of prescription and goals.  Patient verbalized understanding. Christopher Pruitt plans to continue to exercise by joining a gym.   Dr. Firman Hughes is Medical Director for [facility] Cardiac Rehabilitation.  Dr. Fuad Aleskerov is Medical  Director for [facility] Pulmonary Rehabilitation.

## 2023-10-11 ENCOUNTER — Encounter

## 2023-10-13 NOTE — Progress Notes (Signed)
 Cardiac Individual Treatment Plan  Patient Details  Name: GIOVANIE LEFEBRE MRN: [id number] Date of Birth: Oct 08, 1945 Referring Provider:   Flowsheet Row Cardiac Rehab from 07/12/2023 in [facility] Cardiac and Pulmonary Rehab  Referring Provider Dr. Joetta Mustache, MD       Initial Encounter Date:  Flowsheet Row Cardiac Rehab from 07/12/2023 in [facility] Cardiac and Pulmonary Rehab  Date 07/12/23       Visit Diagnosis: S/P CABG x 3  Patient's Home Medications on Admission:  Current Outpatient Medications:    acetaminophen  (TYLENOL ) 650 MG CR tablet, Take 650 mg by mouth every 8 (eight) hours as needed for pain., Disp: , Rfl:    albuterol  (VENTOLIN  HFA) 108 (90 Base) MCG/ACT inhaler, Inhale 2 puffs into the lungs every 6 (six) hours as needed for wheezing or shortness of breath., Disp: 8 g, Rfl: 2   amiodarone (PACERONE) 200 MG tablet, Take by mouth. (Patient not taking: Reported on 09/23/2023), Disp: , Rfl:    aspirin  EC 81 MG tablet, Take 1 tablet (81 mg total) by mouth daily. Swallow whole., Disp: 150 tablet, Rfl: 1   atorvastatin (LIPITOR) 40 MG tablet, Take by mouth., Disp: , Rfl:    cyanocobalamin  (VITAMIN B12) 1000 MCG/ML injection, Inject into the muscle., Disp: , Rfl:    FLUoxetine  (PROZAC ) 20 MG capsule, Take by mouth., Disp: , Rfl:    furosemide  (LASIX ) 20 MG tablet, Take 1 tablet (20 mg total) by mouth daily. (Patient not taking: Reported on 09/23/2023), Disp: 30 tablet, Rfl: 1   ipratropium-albuterol  (DUONEB) 0.5-2.5 (3) MG/3ML SOLN, Take 3 mLs by nebulization every 6 (six) hours as needed (wheezing, shortness of breath). (Patient not taking: Reported on 07/07/2023), Disp: 180 mL, Rfl: 1   levothyroxine  (SYNTHROID , LEVOTHROID) 125 MCG tablet, Take 125 mcg by mouth daily before breakfast., Disp: , Rfl:    lisinopril  (ZESTRIL ) 10 MG tablet, Take 1 tablet (10 mg total) by mouth daily., Disp: 30 tablet, Rfl: 2   metoprolol  succinate (TOPROL -XL) 25 MG 24 hr tablet, Take 1 tablet (25 mg total) by  mouth daily. (Patient not taking: Reported on 09/23/2023), Disp: 30 tablet, Rfl: 2   metoprolol  tartrate (LOPRESSOR ) 25 MG tablet, Take by mouth., Disp: , Rfl:    midodrine (PROAMATINE) 5 MG tablet, Take 1 tablet by mouth 2 (two) times daily., Disp: , Rfl:    ONETOUCH ULTRA TEST test strip, USE 1 EACH (1 STRIP TOTAL) ONCE DAILY USE AS INSTRUCTED., Disp: , Rfl:    oxybutynin  (DITROPAN  XL) 15 MG 24 hr tablet, Take 15 mg by mouth at bedtime., Disp: , Rfl:    tamsulosin (FLOMAX) 0.4 MG CAPS capsule, Take 0.4 mg by mouth daily after breakfast., Disp: , Rfl:   Past Medical History: Past Medical History:  Diagnosis Date   Actinic keratosis    Anemia    Cancer (HCC)    prostate cancer    COPD with emphysema (HCC)    Diabetes mellitus, type 2 (HCC)    Elevated lipids    Gout    STABLE   Heart murmur    History of kidney stones    History of prostate cancer    Hypertension    Hypothyroidism    Left hydrocele    Lymph edema    Lt leg   Lymphedema of left leg    OSA on CPAP    Right wrist fracture    INJURY 02-01-2014 PT FELL   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, ED&C   Urge urinary incontinence    Wears glasses    Wears hearing aid    BILATERAL    Tobacco Use: Social History   Tobacco Use  Smoking Status Former   Current packs/day: 0.00   Types: Cigarettes   Start date: 38   Quit date: 1990   Years since quitting: 35.4   Passive exposure: Past  Smokeless Tobacco Never    Labs: Review Flowsheet       Latest Ref Rng & Units 03/26/2021 05/06/2023  Labs for ITP Cardiac and Pulmonary Rehab  Hemoglobin A1c 4.8 - 5.6 % 6.0  5.9      Exercise Target Goals: Exercise Program Goal: Individual exercise prescription set using results from initial 6 min walk test and THRR while considering  patient's activity barriers and safety.   Exercise Prescription Goal: Initial exercise prescription builds to 30-45 minutes a day of aerobic activity, 2-3 days per week.   Home exercise guidelines will be given to patient during program as part of exercise prescription that the participant will acknowledge.   Education: Aerobic Exercise: - Group verbal and visual presentation on the components of exercise prescription. Introduces F.I.T.T principle from ACSM for exercise prescriptions.  Reviews F.I.T.T. principles of aerobic exercise including progression. Written material given at graduation.   Education: Resistance Exercise: - Group verbal and visual presentation on the components of exercise prescription. Introduces F.I.T.T principle from ACSM for exercise prescriptions  Reviews F.I.T.T. principles of resistance exercise including progression. Written material given at graduation.    Education: Exercise & Equipment Safety: - Individual verbal instruction and demonstration of equipment use and safety with use of the equipment. Flowsheet Row Cardiac Rehab from 09/29/2023 in [facility] Cardiac and Pulmonary Rehab  Date 07/07/23  Educator [facility]  Instruction Review Code 1- Verbalizes Understanding       Education: Exercise Physiology & General Exercise Guidelines: - Group verbal and written instruction with models to review the exercise physiology of the cardiovascular system and associated critical values. Provides general exercise guidelines with specific guidelines to those with heart or lung disease.  Flowsheet Row Cardiac Rehab from 09/29/2023 in [facility] Cardiac and Pulmonary Rehab  Date 09/22/23  Educator NT  Instruction Review Code 1- [employer] Understanding       Education: Flexibility, Balance, Mind/Body Relaxation: - Group verbal and visual presentation with interactive activity on the components of exercise prescription. Introduces F.I.T.T principle from ACSM for exercise prescriptions. Reviews F.I.T.T. principles of flexibility and balance exercise training including progression. Also discusses the mind body connection.  Reviews various relaxation  techniques to help reduce and manage stress (i.e. Deep breathing, progressive muscle relaxation, and visualization). Balance handout provided to take home. Written material given at graduation. Flowsheet Row Cardiac Rehab from 09/29/2023 in [facility] Cardiac and Pulmonary Rehab  Date 09/29/23  Educator NT  Instruction Review Code 1- Verbalizes Understanding       Activity Barriers & Risk Stratification:  Activity Barriers & Cardiac Risk Stratification - 07/12/23 1554       Activity Barriers & Cardiac Risk Stratification   Activity Barriers Joint Problems;Other (comment)    Comments Hx R shoulder pain, rotator cuff surgery    Cardiac Risk Stratification High             6 Minute Walk:  6 Minute Walk     Row Name 07/12/23 1552 09/24/23 1115       6 Minute Walk   Phase Initial Discharge    Distance 960 feet 1190  feet    Distance % Change -- 24 %    Distance Feet Change -- 230 ft    Walk Time 6 minutes 6 minutes    # of Rest Breaks 0 0    MPH 1.82 2.25    METS 1.64 2.1    RPE 13 11    Perceived Dyspnea  0 0    VO2 Peak 5.75 7.36    Symptoms No No    Resting HR 67 bpm 70 bpm    Resting BP 122/54 122/60    Resting Oxygen Saturation  100 % 97 %    Exercise Oxygen Saturation  during 6 min walk 96 % 98 %    Max Ex. HR 96 bpm 98 bpm    Max Ex. BP 128/56 132/54    2 Minute Post BP 120/54 --             Oxygen Initial Assessment:   Oxygen Re-Evaluation:   Oxygen Discharge (Final Oxygen Re-Evaluation):   Initial Exercise Prescription:  Initial Exercise Prescription - 07/12/23 1500       Date of Initial Exercise RX and Referring Provider   Date 07/12/23    Referring Provider Dr. Joetta Mustache, MD      Oxygen   Maintain Oxygen Saturation 88% or higher      Recumbant Bike   Level 1    RPM 50    Watts 15    Minutes 15    METs 1.64      NuStep   Level 1    SPM 80    Minutes 15    METs 1.64      Track   Laps 16    Minutes 15    METs 1.87       Prescription Details   Frequency (times per week) 3    Duration Progress to 30 minutes of continuous aerobic without signs/symptoms of physical distress      Intensity   THRR 40-80% of Max Heartrate 96-126    Ratings of Perceived Exertion 11-13    Perceived Dyspnea 0-4      Progression   Progression Continue to progress workloads to maintain intensity without signs/symptoms of physical distress.      Resistance Training   Training Prescription Yes    Weight 4 lb    Reps 10-15             Perform Capillary Blood Glucose checks as needed.  Exercise Prescription Changes:   Exercise Prescription Changes     Row Name 07/12/23 1500 07/29/23 1800 08/12/23 0700 08/24/23 0800 09/08/23 0800     Response to Exercise   Blood Pressure (Admit) 122/54 106/60 124/70 112/60 122/68   Blood Pressure (Exercise) 128/56 140/64 132/70 126/58 --   Blood Pressure (Exit) 120/54 116/50 118/62 114/54 128/70   Heart Rate (Admit) 67 bpm 65 bpm 55 bpm 69 bpm 68 bpm   Heart Rate (Exercise) 96 bpm 92 bpm 98 bpm 108 bpm 107 bpm   Heart Rate (Exit) 72 bpm 74 bpm 75 bpm 61 bpm 69 bpm   Oxygen Saturation (Admit) 100 % -- -- -- --   Oxygen Saturation (Exercise) 96 % -- -- -- --   Rating of Perceived Exertion (Exercise) 13 13 14 13 14    Perceived Dyspnea (Exercise) 0 -- -- -- --   Symptoms none none none none none   Comments Results first 2 weeks of exericse -- -- --   Duration -- Progress to  30 minutes of  aerobic without signs/symptoms of physical distress Progress to 30 minutes of  aerobic without signs/symptoms of physical distress Continue with 30 min of aerobic exercise without signs/symptoms of physical distress. Continue with 30 min of aerobic exercise without signs/symptoms of physical distress.   Intensity -- THRR unchanged THRR unchanged THRR unchanged THRR unchanged     Progression   Progression -- Continue to progress workloads to maintain intensity without signs/symptoms of physical  distress. Continue to progress workloads to maintain intensity without signs/symptoms of physical distress. Continue to progress workloads to maintain intensity without signs/symptoms of physical distress. Continue to progress workloads to maintain intensity without signs/symptoms of physical distress.   Average METs -- 2.79 2.7 2.7 2.67     Resistance Training   Training Prescription -- Yes Yes Yes Yes   Weight -- 4 lb 4 lb 4 lb 4 lb   Reps -- 10-15 10-15 10-15 10-15     Interval Training   Interval Training -- No No No No     Recumbant Bike   Level -- 2 2.2 2 1    Watts -- 15 15 15 15    Minutes -- 15 15 15 15    METs -- 2.56 2.57 2.57 2.55     NuStep   Level -- 3 3 3 4    Minutes -- 15 15 15 15    METs -- 3 3.4 3.1 3.6     Biostep-RELP   Level -- -- -- 3 2   Minutes -- -- -- 15 15   METs -- -- -- 3 3     Track   Laps -- 12 25 27 25    Minutes -- 15 15 15 15    METs -- 1.65 2.36 2.47 2.36     Oxygen   Maintain Oxygen Saturation -- 88% or higher 88% or higher 88% or higher 88% or higher    Row Name 09/13/23 1100 09/23/23 1400 10/07/23 1700         Response to Exercise   Blood Pressure (Admit) -- 122/60 120/60     Blood Pressure (Exercise) -- -- 132/54     Blood Pressure (Exit) -- 146/70 148/68     Heart Rate (Admit) -- 59 bpm 62 bpm     Heart Rate (Exercise) -- 118 bpm 107 bpm     Heart Rate (Exit) -- 62 bpm 74 bpm     Oxygen Saturation (Admit) -- -- 97 %     Oxygen Saturation (Exercise) -- -- 95 %     Oxygen Saturation (Exit) -- -- 98 %     Rating of Perceived Exertion (Exercise) -- 13 15     Symptoms -- none none     Duration -- Continue with 30 min of aerobic exercise without signs/symptoms of physical distress. Continue with 30 min of aerobic exercise without signs/symptoms of physical distress.     Intensity -- THRR unchanged THRR unchanged       Progression   Progression -- Continue to progress workloads to maintain intensity without signs/symptoms of physical  distress. Continue to progress workloads to maintain intensity without signs/symptoms of physical distress.     Average METs -- 2.94 3.13       Resistance Training   Training Prescription -- Yes Yes     Weight -- 4 lb 4 lb     Reps -- 10-15 10-15       Interval Training   Interval Training -- No No  Recumbant Bike   Level -- 2 1.3     Watts -- 15 15     Minutes -- 15 15     METs -- 3.6 2.56       NuStep   Level -- 5 5     Minutes -- 15 15     METs -- 3.6 4.3       Biostep-RELP   Level -- 1.5 3     Minutes -- 15 15     METs -- 2.1 3       Track   Laps -- 32 40     Minutes -- 15 15     METs -- 2.74 3.18       Home Exercise Plan   Plans to continue exercise at Home (comment) Home (comment) Home (comment)     Frequency Add 2 additional days to program exercise sessions. Add 2 additional days to program exercise sessions. Add 2 additional days to program exercise sessions.     Initial Home Exercises Provided 09/13/23 09/13/23 09/13/23       Oxygen   Maintain Oxygen Saturation 88% or higher 88% or higher 88% or higher              Exercise Comments:   Exercise Comments     Row Name 07/19/23 1132 10/08/23 1149         Exercise Comments First full day of exercise!  Patient was oriented to gym and equipment including functions, settings, policies, and procedures.  Patient's individual exercise prescription and treatment plan were reviewed.  All starting workloads were established based on the results of the 6 minute walk test done at initial orientation visit.  The plan for exercise progression was also introduced and progression will be customized based on patient's performance and goals. Elizer graduated today from  rehab with 36 sessions completed.  Details of the patient's exercise prescription and what He needs to do in order to continue the prescription and progress were discussed with patient.  Patient was given a copy of prescription and goals.  Patient  verbalized understanding. Gregor plans to continue to exercise by joining a gym.               Exercise Goals and Review:   Exercise Goals     Row Name 07/12/23 1555             Exercise Goals   Increase Physical Activity Yes       Intervention Provide advice, education, support and counseling about physical activity/exercise needs.;Develop an individualized exercise prescription for aerobic and resistive training based on initial evaluation findings, risk stratification, comorbidities and participant's personal goals.       Expected Outcomes Short Term: Attend rehab on a regular basis to increase amount of physical activity.;Long Term: Add in home exercise to make exercise part of routine and to increase amount of physical activity.;Long Term: Exercising regularly at least 3-5 days a week.       Increase Strength and Stamina Yes       Intervention Provide advice, education, support and counseling about physical activity/exercise needs.;Develop an individualized exercise prescription for aerobic and resistive training based on initial evaluation findings, risk stratification, comorbidities and participant's personal goals.       Expected Outcomes Short Term: Perform resistance training exercises routinely during rehab and add in resistance training at home;Long Term: Improve cardiorespiratory fitness, muscular endurance and strength as measured by increased METs and functional capacity ( );Short Term: Increase  workloads from initial exercise prescription for resistance, speed, and METs.       Able to understand and use rate of perceived exertion (RPE) scale Yes       Intervention Provide education and explanation on how to use RPE scale       Expected Outcomes Short Term: Able to use RPE daily in rehab to express subjective intensity level;Long Term:  Able to use RPE to guide intensity level when exercising independently       Able to understand and use Dyspnea scale Yes        Intervention Provide education and explanation on how to use Dyspnea scale       Expected Outcomes Short Term: Able to use Dyspnea scale daily in rehab to express subjective sense of shortness of breath during exertion;Long Term: Able to use Dyspnea scale to guide intensity level when exercising independently       Knowledge and understanding of Target Heart Rate Range (THRR) Yes       Intervention Provide education and explanation of THRR including how the numbers were predicted and where they are located for reference       Expected Outcomes Short Term: Able to state/look up THRR;Long Term: Able to use THRR to govern intensity when exercising independently;Short Term: Able to use daily as guideline for intensity in rehab       Able to check pulse independently Yes       Intervention Provide education and demonstration on how to check pulse in carotid and radial arteries.;Review the importance of being able to check your own pulse for safety during independent exercise       Expected Outcomes Short Term: Able to explain why pulse checking is important during independent exercise;Long Term: Able to check pulse independently and accurately       Understanding of Exercise Prescription Yes       Intervention Provide education, explanation, and written materials on patient's individual exercise prescription       Expected Outcomes Short Term: Able to explain program exercise prescription;Long Term: Able to explain home exercise prescription to exercise independently                Exercise Goals Re-Evaluation :  Exercise Goals Re-Evaluation     Row Name 07/19/23 1132 07/29/23 1852 08/12/23 0731 08/24/23 0810 09/08/23 0824     Exercise Goal Re-Evaluation   Exercise Goals Review Able to understand and use rate of perceived exertion (RPE) scale;Able to understand and use Dyspnea scale;Knowledge and understanding of Target Heart Rate Range (THRR);Understanding of Exercise Prescription Increase  Physical Activity;Increase Strength and Stamina;Understanding of Exercise Prescription Increase Physical Activity;Increase Strength and Stamina;Understanding of Exercise Prescription Increase Physical Activity;Increase Strength and Stamina;Understanding of Exercise Prescription Increase Physical Activity;Increase Strength and Stamina;Understanding of Exercise Prescription   Comments Reviewed RPE and dyspnea scale, THR and program prescription with pt today.  Pt voiced understanding and was given a copy of goals to take home. Joseangel is off to a great start in the program. He was able to increase his level on the T5 nustep from 1 to 3. He was also able to increase to level 2 on the recumbent bike. We will continue to monitor his progress in the program. Lorence is doing well in rehab. He recently increased to 25 laps walked on the track. He also improved to level 2.2 on the recumbent bike and continues to work at level 3 on the T4 nustep. We will continue to monitor his  progress in the program. Michaell is doing well in rehab. He recently increased to 27 laps walked on the track. He also began using the biostep at level 3 and continues to work at level 3 on the T4 nustep. We will continue to monitor his progress in the program. Rjay is doing well in rehab. He increased to level 4 on the T4 nustep. He decreased down to 25 laps on the track from 27. He also went down to level 1 on the recumbent bike and level 2 on the biostep. We will continue to monitor his progress in the program.   Expected Outcomes Short: Use RPE daily to regulate intensity. Long: Follow program prescription in THR. Short: Continue to follow exercise prescription. Long: Continue exercise to improve strength and stamina. Short: Continue to push for more laps on the track. Long: Continue exercise to improve strength and stamina. Short: Continue to progressively increase workloads. Long: Continue exercise to improve strength and stamina. Short: Push for  more laps on the track to get back to 27 laps and increase to level 2 on the recumbent bike. Long: Continue exercise to improve strength and stamina.    Row Name 09/13/23 1137 09/23/23 1402 10/07/23 1755         Exercise Goal Re-Evaluation   Exercise Goals Review Increase Physical Activity;Increase Strength and Stamina;Able to understand and use rate of perceived exertion (RPE) scale;Able to understand and use Dyspnea scale;Knowledge and understanding of Target Heart Rate Range (THRR);Able to check pulse independently;Understanding of Exercise Prescription Increase Strength and Stamina;Increase Physical Activity;Understanding of Exercise Prescription Increase Strength and Stamina;Increase Physical Activity;Understanding of Exercise Prescription     Comments Reviewed home exercise with pt today.  Pt plans to do general property maintenance such as push lawn mowing, and weed eating for exercise.  Reviewed THR, pulse, RPE, sign and symptoms, pulse oximetery and when to call 911 or MD.  Also discussed weather considerations and indoor options.  Pt voiced understanding. Quayshawn continues to do well in rehab. He was recently able to increase from 25 to 32 laps on the track in 15 minutes. He was also able to increase from level 4 to 5 on the T4 nustep. We will continue to monitor his progress in the program. Tramayne continues to do well in rehab and will graduate very soon. He completed his post and improved by 24% with [id number]. He increased his laps on the track to 40. He also increased to level 3 on the biostep. We will continue to monitor his progress in the program until graduation.     Expected Outcomes Short: Continue to do yard work, implement more structured exercise such as walking. Long: Continue to exercise at home and in the program to improve strength and stamina. Short: Continue to push for more track laps. Long: Continue exercise to improve strength and stamina. Short: Graduate. Long: Continue to  exercise independently.              Discharge Exercise Prescription (Final Exercise Prescription Changes):  Exercise Prescription Changes - 10/07/23 1700       Response to Exercise   Blood Pressure (Admit) 120/60    Blood Pressure (Exercise) 132/54    Blood Pressure (Exit) 148/68    Heart Rate (Admit) 62 bpm    Heart Rate (Exercise) 107 bpm    Heart Rate (Exit) 74 bpm    Oxygen Saturation (Admit) 97 %    Oxygen Saturation (Exercise) 95 %    Oxygen Saturation (Exit)  98 %    Rating of Perceived Exertion (Exercise) 15    Symptoms none    Duration Continue with 30 min of aerobic exercise without signs/symptoms of physical distress.    Intensity THRR unchanged      Progression   Progression Continue to progress workloads to maintain intensity without signs/symptoms of physical distress.    Average METs 3.13      Resistance Training   Training Prescription Yes    Weight 4 lb    Reps 10-15      Interval Training   Interval Training No      Recumbant Bike   Level 1.3    Watts 15    Minutes 15    METs 2.56      NuStep   Level 5    Minutes 15    METs 4.3      Biostep-RELP   Level 3    Minutes 15    METs 3      Track   Laps 40    Minutes 15    METs 3.18      Home Exercise Plan   Plans to continue exercise at Home (comment)    Frequency Add 2 additional days to program exercise sessions.    Initial Home Exercises Provided 09/13/23      Oxygen   Maintain Oxygen Saturation 88% or higher             Nutrition:  Target Goals: Understanding of nutrition guidelines, daily intake of sodium 1500mg , cholesterol 200mg , calories 30% from fat and 7% or less from saturated fats, daily to have 5 or more servings of fruits and vegetables.  Education: All About Nutrition: -Group instruction provided by verbal, written material, interactive activities, discussions, models, and posters to present general guidelines for heart healthy nutrition including fat, fiber,  MyPlate, the role of sodium in heart healthy nutrition, utilization of the nutrition label, and utilization of this knowledge for meal planning. Follow up email sent as well. Written material given at graduation.   Biometrics:  Pre Biometrics - 07/12/23 1555       Pre Biometrics   Height 5\' 7"  (1.702 m)    Weight 187 lb 11.2 oz (85.1 kg)    Waist Circumference 41 inches    Hip Circumference 40 inches    Waist to Hip Ratio 1.03 %    BMI (Calculated) 29.39    Single Leg Stand 3.18 seconds             Post Biometrics - 09/24/23 1116        Post  Biometrics   Height 5\' 7"  (1.702 m)    Weight 186 lb 6.4 oz (84.6 kg)    Waist Circumference 41 inches    Hip Circumference 38 inches    Waist to Hip Ratio 1.08 %    BMI (Calculated) 29.19    Single Leg Stand 5.47 seconds             Nutrition Therapy Plan and Nutrition Goals:  Nutrition Therapy & Goals - 07/12/23 1337       Nutrition Therapy   Diet Car controlled, cardiac, Low Na    Protein (specify units) 80    Fiber 30 grams    Whole Grain Foods 3 servings    Saturated Fats 15 max. grams    Fruits and Vegetables 5 servings/day    Sodium 2 grams      Personal Nutrition Goals   Nutrition Goal Read labels  and reduce sodium intake to below 2300mg . Ideally 1500mg  per day.    Personal Goal #2 Reduce saturated fat, less than 12g per day. Replace bad fats for more heart healthy fats.    Personal Goal #3 Eat 15-30gProtein and 30-60gCarbs at each meal.    Comments Patient drinking water and sweet tea sweetened with splenda. Usually eats a large dinner, a small snack for lunch and breakfast usually small. Patient and his wife had questions around types of salts, which is better. Spoke to how all salts are high in sodium and therefore should be limited/avoided when seasoning foods. Reviewed mediterranean diet handout, educated on types of fats, sources, and how to read labels. His wife reads labels often, and tries to keep sodium  below 2000mg  per day. She also tries to limit saturated fat. Brainstormed several meals and snacks with foods he likes and will eat focusing on variety while keeping sodium and saturated fat controlled.      Intervention Plan   Intervention Prescribe, educate and counsel regarding individualized specific dietary modifications aiming towards targeted core components such as weight, hypertension, lipid management, diabetes, heart failure and other comorbidities.;Nutrition handout(s) given to patient.    Expected Outcomes Short Term Goal: Understand basic principles of dietary content, such as calories, fat, sodium, cholesterol and nutrients.;Short Term Goal: A plan has been developed with personal nutrition goals set during dietitian appointment.;Long Term Goal: Adherence to prescribed nutrition plan.             Nutrition Assessments:  MEDIFICTS Score Key: >=70 Need to make dietary changes  40-70 Heart Healthy Diet <= 40 Therapeutic Level Cholesterol Diet  Flowsheet Row Cardiac Rehab from 09/27/2023 in [facility] Cardiac [facility] Pul[facility] Rehab  Picture Your Plate Total Score on Admission 72  Picture Your Plate Total Score on Discharge 60      Picture Your Plate Scores: <78 Unhealthy dietary pattern with much room for improvement. 41-50 Dietary pattern unlikely to meet recommendations for good health and room for improvement. 51-60 More healthful dietary pattern, with some room for improvement.  >60 Healthy dietary pattern, although there may be some specific behaviors that could be improved.    Nutrition Goals Re-Evaluation:  Nutrition Goals Re-Evaluation     Row Name 08/16/23 1151 09/10/23 1125           Goals   Current Weight -- 188 lb (85.3 kg)      Nutrition Goal Read labels and reduce sodium intake to below 2300mg . Ideally 1500mg  per day. Continue to make lifestyle changes.      Comment Rossi has issues with not being able to taste well so it is sometimes hard to eat. He reports  that his wife keeps up with what he is eating and reads food labels and watches his sodium consumption very closely. He has good support from his wife with his nutrition needs. Braydan states his wife is helping him with his diet and he feels like he is eating well.      Expected Outcome Short: continue to monitor sodium intake. Long: maintian heart healthy diet to help control cardiac risk factors. Short: maintain his current diet. Long: maintain a diet that adheres to his needs.               Nutrition Goals Discharge (Final Nutrition Goals Re-Evaluation):  Nutrition Goals Re-Evaluation - 09/10/23 1125       Goals   Current Weight 188 lb (85.3 kg)    Nutrition Goal Continue to make  lifestyle changes.    Comment Ram states his wife is helping him with his diet and he feels like he is eating well.    Expected Outcome Short: maintain his current diet. Long: maintain a diet that adheres to his needs.             Psychosocial: Target Goals: Acknowledge presence or absence of significant depression and/or stress, maximize coping skills, provide positive support system. Participant is able to verbalize types and ability to use techniques and skills needed for reducing stress and depression.   Education: Stress, Anxiety, and Depression - Group verbal and visual presentation to define topics covered.  Reviews how body is impacted by stress, anxiety, and depression.  Also discusses healthy ways to reduce stress and to treat/manage anxiety and depression.  Written material given at graduation.   Education: Sleep Hygiene -Provides group verbal and written instruction about how sleep can affect your health.  Define sleep hygiene, discuss sleep cycles and impact of sleep habits. Review good sleep hygiene tips.    Initial Review & Psychosocial Screening:  Initial Psych Review & Screening - 07/07/23 1434       Initial Review   Current issues with Current Psychotropic Meds;Current  Anxiety/Panic      Family Dynamics   Good Support System? Yes    Comments Eshaan states that everyday life can give him anxiety but since surgery he is able to relax more. His wife and daughter are good for his support.      Barriers   Psychosocial barriers to participate in program There are no identifiable barriers or psychosocial needs.;The patient should benefit from training in stress management and relaxation.      Screening Interventions   Interventions Encouraged to exercise;To provide support and resources with identified psychosocial needs;Provide feedback about the scores to participant    Expected Outcomes Short Term goal: Utilizing psychosocial counselor, staff and physician to assist with identification of specific Stressors or current issues interfering with healing process. Setting desired goal for each stressor or current issue identified.;Long Term Goal: Stressors or current issues are controlled or eliminated.;Short Term goal: Identification and review with participant of any Quality of Life or Depression concerns found by scoring the questionnaire.;Long Term goal: The participant improves quality of Life and PHQ9 Scores as seen by post scores and/or verbalization of changes             Quality of Life Scores:   Quality of Life - 09/27/23 1137       Quality of Life Scores   Health/Function Pre 24.19 %    Health/Function Post 20.43 %    Health/Function % Change -15.54 %    Socioeconomic Pre 27.93 %    Socioeconomic Post 22.5 %    Socioeconomic % Change  -19.44 %    Psych/Spiritual Pre 26.64 %    Psych/Spiritual Post 21.21 %    Psych/Spiritual % Change -20.38 %    Family Pre 15.5 %    Family Post 20.2 %    Family % Change 30.32 %    GLOBAL Pre 24.19 %    GLOBAL Post 20.99 %    GLOBAL % Change -13.23 %            Scores of 19 and below usually indicate a poorer quality of life in these areas.  A difference of  2-3 points is a clinically meaningful  difference.  A difference of 2-3 points in the total score of the Quality of Life  Index has been associated with significant improvement in overall quality of life, self-image, physical symptoms, and general health in studies assessing change in quality of life.  PHQ-9: Review Flowsheet       09/27/2023 08/24/2023 07/12/2023  Depression screen PHQ 2/9  Decreased Interest 0 0 0  Down, Depressed, Hopeless 0 0 0  PHQ - 2 Score 0 0 0  Altered sleeping 0 - 0  Tired, decreased energy 0 - 1  Change in appetite 0 - 1  Feeling bad or failure about yourself  1 - 0  Trouble concentrating 0 - 0  Moving slowly or fidgety/restless 0 - 0  Suicidal thoughts 0 - 0  PHQ-9 Score 1 - 2  Difficult doing work/chores Not difficult at all - Not difficult at all   Interpretation of Total Score  Total Score Depression Severity:  1-4 = Minimal depression, 5-9 = Mild depression, 10-14 = Moderate depression, 15-19 = Moderately severe depression, 20-27 = Severe depression   Psychosocial Evaluation and Intervention:  Psychosocial Evaluation - 07/07/23 1436       Psychosocial Evaluation & Interventions   Interventions Encouraged to exercise with the program and follow exercise prescription;Relaxation education;Stress management education    Comments Khaliel states that everyday life can give him anxiety but since surgery he is able to relax more. His wife and daughter are good for his support.    Expected Outcomes Short: Attend [facility] stress management education to decrease stress. Long: Maintain exercise Post HeartTrack to keep stress at a minimum.    Continue Psychosocial Services  Follow up required by staff             Psychosocial Re-Evaluation:  Psychosocial Re-Evaluation     Row Name 08/16/23 1153 09/10/23 1137           Psychosocial Re-Evaluation   Current issues with Current Psychotropic Meds;Current Anxiety/Panic None Identified      Comments Sire reports that he has no changes or  concerns currently with sleep, stress, or mental health. All of these areas are pretty steady and he has good support from his wife. Patient reports no issues with their current mental states, sleep, stress, depression or anxiety. Will follow up with patient in a few weeks for any changes.      Expected Outcomes Short: continue to attend cardiac rehab to exercise for mental health benefits. Long: maintain good mental health routine and notify doctor of any changes or concerns. Short: Continue to exercise regularly to support mental health and notify staff of any changes. Long: maintain mental health and well being through teaching of rehab or prescribed medications independently.      Interventions Encouraged to attend Cardiac Rehabilitation for the exercise Encouraged to attend Cardiac Rehabilitation for the exercise      Continue Psychosocial Services  Follow up required by staff Follow up required by staff               Psychosocial Discharge (Final Psychosocial Re-Evaluation):  Psychosocial Re-Evaluation - 09/10/23 1137       Psychosocial Re-Evaluation   Current issues with None Identified    Comments Patient reports no issues with their current mental states, sleep, stress, depression or anxiety. Will follow up with patient in a few weeks for any changes.    Expected Outcomes Short: Continue to exercise regularly to support mental health and notify staff of any changes. Long: maintain mental health and well being through teaching of rehab or prescribed medications independently.  Interventions Encouraged to attend Cardiac Rehabilitation for the exercise    Continue Psychosocial Services  Follow up required by staff             Vocational Rehabilitation: Provide vocational rehab assistance to qualifying candidates.   Vocational Rehab Evaluation & Intervention:   Education: Education Goals: Education classes will be provided on a variety of topics geared toward better  understanding of heart health and risk factor modification. Participant will state understanding/return demonstration of topics presented as noted by education test scores.  Learning Barriers/Preferences:  Learning Barriers/Preferences - 07/07/23 1434       Learning Barriers/Preferences   Learning Barriers Sight    Learning Preferences None             General Cardiac Education Topics:  AED/CPR: - Group verbal and written instruction with the use of models to demonstrate the basic use of the AED with the basic ABC's of resuscitation.   Anatomy and Cardiac Procedures: - Group verbal and visual presentation and models provide information about basic cardiac anatomy and function. Reviews the testing methods done to diagnose heart disease and the outcomes of the test results. Describes the treatment choices: Medical Management, Angioplasty, or Coronary Bypass Surgery for treating various heart conditions including Myocardial Infarction, Angina, Valve Disease, and Cardiac Arrhythmias.  Written material given at graduation.   Medication Safety: - Group verbal and visual instruction to review commonly prescribed medications for heart and lung disease. Reviews the medication, class of the drug, and side effects. Includes the steps to properly store meds and maintain the prescription regimen.  Written material given at graduation.   Intimacy: - Group verbal instruction through game format to discuss how heart and lung disease can affect sexual intimacy. Written material given at graduation..   Know Your Numbers and Heart Failure: - Group verbal and visual instruction to discuss disease risk factors for cardiac and pulmonary disease and treatment options.  Reviews associated critical values for Overweight/Obesity, Hypertension, Cholesterol, and Diabetes.  Discusses basics of heart failure: signs/symptoms and treatments.  Introduces Heart Failure Zone chart for action plan for heart failure.   Written material given at graduation.   Infection Prevention: - Provides verbal and written material to individual with discussion of infection control including proper hand washing and proper equipment cleaning during exercise session. Flowsheet Row Cardiac Rehab from 09/29/2023 in [facility] Cardiac and Pulmonary Rehab  Date 07/07/23  Educator [facility]  Instruction Review Code 1- Verbalizes Understanding       Falls Prevention: - Provides verbal and written material to individual with discussion of falls prevention and safety. Flowsheet Row Cardiac Rehab from 09/29/2023 in [facility] Cardiac and Pulmonary Rehab  Date 07/07/23  Educator [facility]  Instruction Review Code 1- Verbalizes Understanding       Other: -Provides group and verbal instruction on various topics (see comments)   Knowledge Questionnaire Score:  Knowledge Questionnaire Score - 09/27/23 1137       Knowledge Questionnaire Score   Pre Score 21/26    Post Score 20/26             Core Components/Risk Factors/Patient Goals at Admission:  Personal Goals and Risk Factors at Admission - 07/07/23 1433       Core Components/Risk Factors/Patient Goals on Admission    Weight Management Yes    Intervention Weight Management/Obesity: Establish reasonable short term and long term weight goals.;Weight Management: Provide education and appropriate resources to help participant work on and attain dietary goals.;Weight Management: Develop a  combined nutrition and exercise program designed to reach desired caloric intake, while maintaining appropriate intake of nutrient and fiber, sodium and fats, and appropriate energy expenditure required for the weight goal.    Expected Outcomes Short Term: Continue to assess and modify interventions until short term weight is achieved;Weight Loss: Understanding of general recommendations for a balanced deficit meal plan, which promotes 1-2 lb weight loss per week and includes a negative energy balance of  [phone] kcal/d;Understanding recommendations for meals to include 15-35% energy as protein, 25-35% energy from fat, 35-60% energy from carbohydrates, less than 200mg  of dietary cholesterol, 20-35 gm of total fiber daily;Understanding of distribution of calorie intake throughout the day with the consumption of 4-5 meals/snacks    Diabetes Yes    Intervention Provide education about signs/symptoms and action to take for hypo/hyperglycemia.;Provide education about proper nutrition, including hydration, and aerobic/resistive exercise prescription along with prescribed medications to achieve blood glucose in normal ranges: Fasting glucose 65-99 mg/dL    Expected Outcomes Short Term: Participant verbalizes understanding of the signs/symptoms and immediate care of hyper/hypoglycemia, proper foot care and importance of medication, aerobic/resistive exercise and nutrition plan for blood glucose control.;Long Term: Attainment of HbA1C < 7%.    Heart Failure Yes    Intervention Provide a combined exercise and nutrition program that is supplemented with education, support and counseling about heart failure. Directed toward relieving symptoms such as shortness of breath, decreased exercise tolerance, and extremity edema.    Expected Outcomes Short term: Attendance in program 2-3 days a week with increased exercise capacity. Reported lower sodium intake. Reported increased fruit and vegetable intake. Reports medication compliance.;Improve functional capacity of life;Short term: Daily weights obtained and reported for increase. Utilizing diuretic protocols set by physician.;Long term: Adoption of self-care skills and reduction of barriers for early signs and symptoms recognition and intervention leading to self-care maintenance.    Hypertension Yes    Intervention Provide education on lifestyle modifcations including regular physical activity/exercise, weight management, moderate sodium restriction and increased  consumption of fresh fruit, vegetables, and low fat dairy, alcohol moderation, and smoking cessation.;Monitor prescription use compliance.    Expected Outcomes Short Term: Continued assessment and intervention until BP is < 140/82mm HG in hypertensive participants. < 130/80mm HG in hypertensive participants with diabetes, heart failure or chronic kidney disease.;Long Term: Maintenance of blood pressure at goal levels.             Education:Diabetes - Individual verbal and written instruction to review signs/symptoms of diabetes, desired ranges of glucose level fasting, after meals and with exercise. Acknowledge that pre and post exercise glucose checks will be done for 3 sessions at entry of program. Flowsheet Row Cardiac Rehab from 09/29/2023 in [facility] Cardiac and Pulmonary Rehab  Date 07/07/23  Educator [facility]  Instruction Review Code 1- Verbalizes Understanding       Core Components/Risk Factors/Patient Goals Review:   Goals and Risk Factor Review     Row Name 08/16/23 1149 09/10/23 1143           Core Components/Risk Factors/Patient Goals Review   Personal Goals Review Diabetes;Heart Failure;Hypertension Other      Review Ripken reports that he has a good morning routine of monitoring weight, blood pressure, and blood sugar. He is very consistent with monitoring and takes all his meds as prescirbed by his doctors. He reports no concerns with his levels in these areas currently but will continue to monitor and report any changes or concerns to his doctor. Jafeth states he  is feeling better and does not have as much shoulder pain. He states having more energy and he does not have to have his retinal surgery. He does not have any questions about any of his medications.      Expected Outcomes Short: continue routine of monitoring BP, blood sugar, and weight. Long: control cardiac risk factors long term. 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               Core  Components/Risk Factors/Patient Goals at Discharge (Final Review):   Goals and Risk Factor Review - 09/10/23 1143       Core Components/Risk Factors/Patient Goals Review   Personal Goals Review Other    Review Barney states he is feeling better and does not have as much shoulder pain. He states having more energy and he does not have to have his retinal surgery. He does not have any questions about any of his medications.    Expected Outcomes 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             ITP Comments:  ITP Comments     Row Name 07/07/23 1433 07/12/23 1548 07/19/23 1132 07/21/23 0758 08/18/23 1207   ITP Comments Virtual Visit completed. Patient informed on EP and RD appointment and 6 Minute walk test. Patient also informed of patient health questionnaires on My Chart. Patient Verbalizes understanding. Visit diagnosis can be found in [facility] 06/16/2023. Completed and gym orientation. Initial ITP created and sent for review to Dr. Firman Hughes, Medical Director. First full day of exercise!  Patient was oriented to gym and equipment including functions, settings, policies, and procedures.  Patient's individual exercise prescription and treatment plan were reviewed.  All starting workloads were established based on the results of the 6 minute walk test done at initial orientation visit.  The plan for exercise progression was also introduced and progression will be customized based on patient's performance and goals. 30 Day review completed. Medical Director ITP review done, changes made as directed, and signed approval by Medical Director. New to program. 30 Day review completed. Medical Director ITP review done, changes made as directed, and signed approval by Medical Director.    Row Name 09/15/23 1344 10/08/23 1149         ITP Comments 30 Day review completed. Medical Director ITP review done, changes made as directed, and signed approval by Medical Director. Mickeal graduated  today from  rehab with 36 sessions completed.  Details of the patient's exercise prescription and what He needs to do in order to continue the prescription and progress were discussed with patient.  Patient was given a copy of prescription and goals.  Patient verbalized understanding. Maxmilian plans to continue to exercise by joining a gym.               Comments: Discharge ITP

## 2023-10-13 NOTE — Progress Notes (Signed)
 Discharge Note for  Christopher Pruitt     24-Feb-1946         [name] graduated today from rehab with 36 sessions completed. Details of the patient's exercise prescription and what He needs to do in order to continue the prescription and progress were discussed with patient. Patient was given a copy of prescription and goals. Patient verbalized understanding. [name] plans to continue to exercise by joining a gym.      6 Minute Walk     Row Name 07/12/23 1552 09/24/23 1115       6 Minute Walk   Phase Initial Discharge    Distance 960 feet 1190 feet    Distance % Change -- 24 %    Distance Feet Change -- 230 ft    Walk Time 6 minutes 6 minutes    # of Rest Breaks 0 0    MPH 1.82 2.25    METS 1.64 2.1    RPE 13 11    Perceived Dyspnea  0 0    VO2 Peak 5.75 7.36    Symptoms No No    Resting HR 67 bpm 70 bpm    Resting BP 122/54 122/60    Resting Oxygen Saturation  100 % 97 %    Exercise Oxygen Saturation  during 6 min walk 96 % 98 %    Max Ex. HR 96 bpm 98 bpm    Max Ex. BP 128/56 132/54    2 Minute Post BP 120/54 --

## 2023-10-18 ENCOUNTER — Other Ambulatory Visit: Payer: Self-pay | Admitting: Urology

## 2023-10-21 ENCOUNTER — Inpatient Hospital Stay: Attending: Oncology

## 2023-10-21 ENCOUNTER — Inpatient Hospital Stay

## 2023-10-21 VITALS — BP 166/79

## 2023-10-21 DIAGNOSIS — I252 Old myocardial infarction: Secondary | ICD-10-CM | POA: Diagnosis not present

## 2023-10-21 DIAGNOSIS — N1832 Chronic kidney disease, stage 3b: Secondary | ICD-10-CM

## 2023-10-21 DIAGNOSIS — D472 Monoclonal gammopathy: Secondary | ICD-10-CM | POA: Diagnosis not present

## 2023-10-21 DIAGNOSIS — I130 Hypertensive heart and chronic kidney disease with heart failure and stage 1 through stage 4 chronic kidney disease, or unspecified chronic kidney disease: Secondary | ICD-10-CM | POA: Insufficient documentation

## 2023-10-21 DIAGNOSIS — D631 Anemia in chronic kidney disease: Secondary | ICD-10-CM | POA: Insufficient documentation

## 2023-10-21 DIAGNOSIS — E0922 Drug or chemical induced diabetes mellitus with diabetic chronic kidney disease: Secondary | ICD-10-CM | POA: Diagnosis not present

## 2023-10-21 DIAGNOSIS — N184 Chronic kidney disease, stage 4 (severe): Secondary | ICD-10-CM | POA: Insufficient documentation

## 2023-10-21 LAB — HEMOGLOBIN AND HEMATOCRIT (CANCER CENTER ONLY)
HCT: 27.2 % — ABNORMAL LOW (ref 39.0–52.0)
Hemoglobin: 9 g/dL — ABNORMAL LOW (ref 13.0–17.0)

## 2023-10-21 MED ORDER — EPOETIN ALFA-EPBX 40000 UNIT/ML IJ SOLN
40000.0000 [IU] | INTRAMUSCULAR | Status: DC
  Administered 2023-10-21: 40000 [IU] via SUBCUTANEOUS
  Filled 2023-10-21: qty 1

## 2023-10-22 LAB — MISC LABCORP TEST (SEND OUT): Labcorp test code: 143000

## 2023-11-18 ENCOUNTER — Inpatient Hospital Stay

## 2023-11-18 ENCOUNTER — Inpatient Hospital Stay: Attending: Oncology

## 2023-11-18 VITALS — BP 130/62

## 2023-11-18 DIAGNOSIS — N1832 Chronic kidney disease, stage 3b: Secondary | ICD-10-CM

## 2023-11-18 DIAGNOSIS — N184 Chronic kidney disease, stage 4 (severe): Secondary | ICD-10-CM | POA: Diagnosis present

## 2023-11-18 DIAGNOSIS — I130 Hypertensive heart and chronic kidney disease with heart failure and stage 1 through stage 4 chronic kidney disease, or unspecified chronic kidney disease: Secondary | ICD-10-CM | POA: Insufficient documentation

## 2023-11-18 DIAGNOSIS — D631 Anemia in chronic kidney disease: Secondary | ICD-10-CM | POA: Diagnosis present

## 2023-11-18 LAB — HEMOGLOBIN AND HEMATOCRIT (CANCER CENTER ONLY)
HCT: 28.4 % — ABNORMAL LOW (ref 39.0–52.0)
Hemoglobin: 9.8 g/dL — ABNORMAL LOW (ref 13.0–17.0)

## 2023-11-18 MED ORDER — EPOETIN ALFA-EPBX 40000 UNIT/ML IJ SOLN
40000.0000 [IU] | INTRAMUSCULAR | Status: DC
  Administered 2023-11-18: 40000 [IU] via SUBCUTANEOUS
  Filled 2023-11-18: qty 1

## 2023-12-28 ENCOUNTER — Inpatient Hospital Stay: Attending: Oncology

## 2023-12-28 DIAGNOSIS — D631 Anemia in chronic kidney disease: Secondary | ICD-10-CM | POA: Insufficient documentation

## 2023-12-28 DIAGNOSIS — Z79890 Hormone replacement therapy (postmenopausal): Secondary | ICD-10-CM | POA: Insufficient documentation

## 2023-12-28 DIAGNOSIS — N184 Chronic kidney disease, stage 4 (severe): Secondary | ICD-10-CM | POA: Diagnosis present

## 2023-12-28 DIAGNOSIS — Z7982 Long term (current) use of aspirin: Secondary | ICD-10-CM | POA: Diagnosis not present

## 2023-12-28 DIAGNOSIS — I129 Hypertensive chronic kidney disease with stage 1 through stage 4 chronic kidney disease, or unspecified chronic kidney disease: Secondary | ICD-10-CM | POA: Insufficient documentation

## 2023-12-28 DIAGNOSIS — D472 Monoclonal gammopathy: Secondary | ICD-10-CM | POA: Insufficient documentation

## 2023-12-28 DIAGNOSIS — Z79899 Other long term (current) drug therapy: Secondary | ICD-10-CM | POA: Insufficient documentation

## 2023-12-28 DIAGNOSIS — Z87891 Personal history of nicotine dependence: Secondary | ICD-10-CM | POA: Diagnosis not present

## 2023-12-28 DIAGNOSIS — I130 Hypertensive heart and chronic kidney disease with heart failure and stage 1 through stage 4 chronic kidney disease, or unspecified chronic kidney disease: Secondary | ICD-10-CM | POA: Diagnosis present

## 2023-12-28 DIAGNOSIS — Z800 Family history of malignant neoplasm of digestive organs: Secondary | ICD-10-CM | POA: Diagnosis not present

## 2023-12-28 LAB — IRON AND TIBC
Iron: 63 ug/dL (ref 45–182)
Saturation Ratios: 21 % (ref 17.9–39.5)
TIBC: 305 ug/dL (ref 250–450)
UIBC: 242 ug/dL

## 2023-12-28 LAB — CBC WITH DIFFERENTIAL (CANCER CENTER ONLY)
Abs Immature Granulocytes: 0.04 K/uL (ref 0.00–0.07)
Basophils Absolute: 0 K/uL (ref 0.0–0.1)
Basophils Relative: 1 %
Eosinophils Absolute: 0 K/uL (ref 0.0–0.5)
Eosinophils Relative: 1 %
HCT: 26.9 % — ABNORMAL LOW (ref 39.0–52.0)
Hemoglobin: 9.1 g/dL — ABNORMAL LOW (ref 13.0–17.0)
Immature Granulocytes: 1 %
Lymphocytes Relative: 20 %
Lymphs Abs: 0.7 K/uL (ref 0.7–4.0)
MCH: 30 pg (ref 26.0–34.0)
MCHC: 33.8 g/dL (ref 30.0–36.0)
MCV: 88.8 fL (ref 80.0–100.0)
Monocytes Absolute: 0.3 K/uL (ref 0.1–1.0)
Monocytes Relative: 8 %
Neutro Abs: 2.6 K/uL (ref 1.7–7.7)
Neutrophils Relative %: 69 %
Platelet Count: 100 K/uL — ABNORMAL LOW (ref 150–400)
RBC: 3.03 MIL/uL — ABNORMAL LOW (ref 4.22–5.81)
RDW: 15.9 % — ABNORMAL HIGH (ref 11.5–15.5)
WBC Count: 3.7 K/uL — ABNORMAL LOW (ref 4.0–10.5)
nRBC: 0 % (ref 0.0–0.2)

## 2023-12-28 LAB — RETIC PANEL
Immature Retic Fract: 8.2 % (ref 2.3–15.9)
RBC.: 3.08 MIL/uL — ABNORMAL LOW (ref 4.22–5.81)
Retic Count, Absolute: 22.2 K/uL (ref 19.0–186.0)
Retic Ct Pct: 0.7 % (ref 0.4–3.1)
Reticulocyte Hemoglobin: 33 pg (ref >27.9)

## 2023-12-28 LAB — FERRITIN: Ferritin: 187 ng/mL (ref 24–336)

## 2023-12-30 ENCOUNTER — Inpatient Hospital Stay

## 2023-12-30 ENCOUNTER — Telehealth: Payer: Self-pay

## 2023-12-30 ENCOUNTER — Inpatient Hospital Stay (HOSPITAL_BASED_OUTPATIENT_CLINIC_OR_DEPARTMENT_OTHER): Admitting: Oncology

## 2023-12-30 ENCOUNTER — Encounter: Payer: Self-pay | Admitting: Oncology

## 2023-12-30 VITALS — BP 124/60 | HR 62 | Temp 99.0°F | Resp 18 | Ht 67.0 in | Wt 180.0 lb

## 2023-12-30 DIAGNOSIS — N1832 Chronic kidney disease, stage 3b: Secondary | ICD-10-CM

## 2023-12-30 DIAGNOSIS — D472 Monoclonal gammopathy: Secondary | ICD-10-CM | POA: Diagnosis not present

## 2023-12-30 DIAGNOSIS — D631 Anemia in chronic kidney disease: Secondary | ICD-10-CM

## 2023-12-30 DIAGNOSIS — I129 Hypertensive chronic kidney disease with stage 1 through stage 4 chronic kidney disease, or unspecified chronic kidney disease: Secondary | ICD-10-CM | POA: Diagnosis not present

## 2023-12-30 MED ORDER — EPOETIN ALFA-EPBX 40000 UNIT/ML IJ SOLN
40000.0000 [IU] | Freq: Once | INTRAMUSCULAR | Status: AC
  Administered 2023-12-30: 40000 [IU] via SUBCUTANEOUS
  Filled 2023-12-30: qty 1

## 2023-12-30 NOTE — Progress Notes (Signed)
 Patient is doing ok, no new questions for the doctor today.

## 2023-12-30 NOTE — Assessment & Plan Note (Addendum)
 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 Previous labs were reviewed and discussed with patient. Lab Results  Component Value Date   HGB 9.1 (L) 12/28/2023   TIBC 305 12/28/2023   IRONPCTSAT 21 12/28/2023   FERRITIN 187 12/28/2023     Recommend Retacrit  40,000 unit monthly. Ferritin is less than 200.  Recommend patient to start oral iron supplementation- Vitron C 1 tablet daily.

## 2023-12-30 NOTE — Assessment & Plan Note (Addendum)
 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  Consider cardiac MRI.SABRA  For now I recommend observation. Check SPEP and light chain ratio every 6 months.

## 2023-12-30 NOTE — Assessment & Plan Note (Signed)
 Encourage oral hydration and avoid nephrotoxins.

## 2023-12-30 NOTE — Telephone Encounter (Signed)
 error

## 2023-12-30 NOTE — Progress Notes (Signed)
 Hematology/Oncology Consult note [phone] [phone]        REFERRING PROVIDER: Cleotilde Oneil FALCON, MD   CHIEF COMPLAINTS/REASON FOR VISIT:  anemia.  MGUS   ASSESSMENT & PLAN:   Anemia in chronic kidney disease (CKD) Anemia is likely secondary to chronic kidney disease. Previous labs were reviewed and discussed with patient. Lab Results  Component Value Date   HGB 9.1 (L) 12/28/2023   TIBC 305 12/28/2023   IRONPCTSAT 21 12/28/2023   FERRITIN 187 12/28/2023     Recommend Retacrit  40,000 unit monthly. Ferritin is less than 200.  Recommend patient to start oral iron supplementation- Vitron C 1 tablet daily.  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  Consider cardiac MRI.Christopher Pruitt  For now I recommend observation. Check SPEP and light chain ratio every 6 months.    CKD (chronic kidney disease) stage 3, GFR 30-59 ml/min (HCC) Encourage oral hydration and avoid nephrotoxins.     Orders Placed This Encounter  Procedures   CBC with Differential (Cancer Center Only)    Standing Status:   Future    Expected Date:   03/31/2024    Expiration Date:   06/29/2024   Iron and TIBC    Standing Status:   Future    Expected Date:   03/31/2024    Expiration Date:   06/29/2024   Ferritin    Standing Status:   Future    Expected Date:   03/31/2024    Expiration Date:   06/29/2024   Retic Panel    Standing Status:   Future    Expected Date:   03/31/2024    Expiration Date:   06/29/2024   Multiple Myeloma Panel (SPEP&IFE w/QIG)    Standing Status:   Future    Expected Date:   03/31/2024    Expiration Date:   06/29/2024    Kappa/lambda light chains    Standing Status:   Future    Expected Date:   03/31/2024    Expiration Date:   06/29/2024   Hemoglobin and Hematocrit (Cancer Center Only)    Standing Status:   Standing    Number of Occurrences:   2    Expiration Date:   12/29/2024   Follow-up in 3 months All questions were answered. The patient knows to call the clinic with any problems, questions or concerns.  Zelphia Cap, MD, PhD [facility] Hematology Oncology 12/30/2023   HISTORY OF PRESENTING ILLNESS:   Christopher Pruitt is a  78 y.o.  male with PMH listed below was seen in consultation at the request of  Cleotilde Oneil FALCON, MD  for evaluation of anemia.   05/06/2023 - 05/10/2023 patient was hospitalized at [facility] health due to acute respiratory failure with hypoxia, non-ST elevated myocardial infarction.  Patient was treated with heparin  drip.  Cardiac cath showed diffuse disease.  Patient was also treated for atypical pneumonia. January 2025, patient followed up with cardiology and was admitted for optimization prior to bypass surgery.  He was found to have severe anemia and received IV Venofer 2 50 mg daily x 4 doses as well as Retacrit  40,000 unit x 12 June 2023, patient underwent coronary artery  bypass graft x 3 at [facility]. He received a blood transfusion during this hospitalization due to low hemoglobin levels, which were as low as 6.6 g/dL.  He recalls having multiple blood draws during his hospital stay, which he believes contributed to his anemia.  His most recent hemoglobin level was 8 g/dL.  He has a history of a heart attack in December, followed by a planned triple bypass surgery in February. He has chronic kidney disease stage 4, which was present before his recent heart surgery. This condition may affect erythropoietin production, impacting his anemia management.  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   INTERVAL HISTORY Christopher Pruitt is a 78 y.o. male who has above history reviewed by me today presents for follow up visit for anemia, MGUS.  Patient is on Retacrit  monthly if hemoglobin is less than 10.  Overall he tolerates well.  No new complaints.  Accompanied by wife.   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, ED&C   Urge urinary incontinence    Wears glasses    Wears hearing aid    BILATERAL    SURGICAL HISTORY: Past Surgical History:  Procedure Laterality Date   CARPAL TUNNEL RELEASE Left 10/25/2018   Procedure: CARPAL TUNNEL RELEASE ENDOSCOPIC LEFT, DIABETIC, SLEEPAPNEA;  Surgeon: Edie Norleen PARAS, MD;  Location: [facility] ORS;  Service: Orthopedics;  Laterality: Left;   CARPAL TUNNEL RELEASE Right 02/01/2019   Procedure: CARPAL TUNNEL RELEASE ENDOSCOPIC;  Surgeon: Edie Norleen PARAS, MD;  Location: [facility] ORS;  Service: Orthopedics;  Laterality: Right;   colonoscopy with polypectomy     COLONOSCOPY WITH PROPOFOL  N/A 07/27/2018   Procedure: COLONOSCOPY WITH PROPOFOL ;  Surgeon: Toledo, Ladell POUR, MD;  Location: [facility] ENDOSCOPY;  Service: Gastroenterology;  Laterality: N/A;   heart bypass surgery  06/17/2023   HYDROCELE EXCISION Left 02/11/2015   Procedure: LEFT HYDROCELECTOMY ADULT;  Surgeon: Redell Lynwood Napoleon, MD;  Location: [facility];  Service: Urology;  Laterality: Left;   INGUINAL HERNIA REPAIR Bilateral 04/18/2021   Procedure: LAPAROSCOPIC BILATERAL  HERNIA REPAIR WITH MESH, TRANSABDOMINAL PLANE BLOCK, LEFT INCARCARATED SCROTAL HERNIA, RIGHT INGUINAL HERNIA REPAIR;  Surgeon: Sheldon Standing, MD;  Location: [facility] ORS;  Service: General;  Laterality: Bilateral;   JOINT REPLACEMENT     Rt knee   LEFT HEART CATH AND CORONARY ANGIOGRAPHY N/A 05/10/2023   Procedure: LEFT HEART CATH AND CORONARY ANGIOGRAPHY;  Surgeon: Ammon Blunt, MD;  Location: [facility] INVASIVE  CV LAB;  Service: Cardiovascular;  Laterality: N/A;   PARTIAL KNEE ARTHROPLASTY Right 2012   PLACEMENT GOLD STUDS IN PROSTATE  2014   [place]   for [facility] Radiation (prostate cancer)   SHOULDER OPEN ROTATOR CUFF REPAIR Left 2010   TONSILLECTOMY  as child    SOCIAL HISTORY: Social History   Socioeconomic History   Marital status: Married    Spouse name: Not on file   Number of children: Not on file   Years of education: Not on file   Highest education level: Not on file  Occupational History   Not on file  Tobacco Use   Smoking status: Former    Current packs/day: 0.00    Types: Cigarettes    Start date: 50    Quit date: 1990    Years since quitting: 35.6    Passive exposure: Past   Smokeless tobacco: Never  Vaping Use   Vaping status: Never Used  Substance and Sexual Activity   Alcohol use: No   Drug use: No   Sexual activity: Not on file  Other Topics Concern   Not on file  Social History Narrative   Not on file   Social Drivers of Health   Financial Resource Strain: Low Risk  (08/19/2023)   Received from [facility]   Overall Financial Resource Strain (CARDIA)    Difficulty of Paying Living Expenses: Not hard at all  Food Insecurity: No Food Insecurity (08/24/2023)   Hunger Vital Sign    Worried About Running Out of Food in the Last Year: Never true     Ran Out of Food in the Last Year: Never true  Transportation Needs: No Transportation Needs (08/24/2023)   PRAPARE - Administrator, Civil Service (Medical): No    Lack of Transportation (Non-Medical): No  Recent Concern: Transportation Needs - Unmet Transportation Needs (06/24/2023)   Received from [facility] - Transportation    In the past 12 months, has lack of transportation kept you from medical appointments or from getting medications?: Yes    Lack of Transportation (Non-Medical): Yes  Physical Activity: Not on file  Stress: Not on file  Social Connections: Not on file  Intimate Partner Violence: Not At Risk (05/08/2023)   Humiliation, Afraid, Rape, and Kick questionnaire    Fear of Current or Ex-Partner: No    Emotionally Abused: No    Physically Abused: No    Sexually Abused: No    FAMILY HISTORY: Family History  Problem Relation Age of Onset   Colon cancer Mother    Heart attack Father     ALLERGIES:  is allergic to amoxicillin-pot clavulanate, oxycodone, clindamycin /lincomycin, nalfon [fenoprofen calcium ], and robaxin [methocarbamol].  MEDICATIONS:  Current Outpatient Medications  Medication Sig Dispense Refill   acetaminophen  (TYLENOL ) 650 MG CR tablet Take 650 mg by mouth every 8 (eight) hours as needed for pain.     albuterol  (VENTOLIN  HFA) 108 (90 Base) MCG/ACT inhaler Inhale 2 puffs into the lungs every 6 (six) hours as needed for wheezing or shortness of breath. 8 g 2   aspirin  EC 81 MG tablet Take 1 tablet (81 mg total) by mouth daily. Swallow whole. 150 tablet 1   atorvastatin (LIPITOR) 40 MG tablet Take by mouth.     FLUoxetine  (PROZAC ) 20 MG capsule Take by mouth.     furosemide  (LASIX ) 20 MG tablet Take 1 tablet (20 mg total) by mouth daily. 30 tablet 1  ipratropium-albuterol  (DUONEB) 0.5-2.5 (3) MG/3ML SOLN Take 3 mLs by nebulization every 6 (six) hours as needed (wheezing, shortness of breath). 180 mL 1    levothyroxine  (SYNTHROID , LEVOTHROID) 125 MCG tablet Take 125 mcg by mouth daily before breakfast.     lisinopril  (ZESTRIL ) 10 MG tablet Take 1 tablet (10 mg total) by mouth daily. 30 tablet 2   midodrine (PROAMATINE) 5 MG tablet Take 1 tablet by mouth 2 (two) times daily.     ONETOUCH ULTRA TEST test strip USE 1 EACH (1 STRIP TOTAL) ONCE DAILY USE AS INSTRUCTED.     oxybutynin  (DITROPAN  XL) 15 MG 24 hr tablet Take 15 mg by mouth at bedtime.     tamsulosin (FLOMAX) 0.4 MG CAPS capsule Take 0.4 mg by mouth daily after breakfast.     amiodarone (PACERONE) 200 MG tablet Take by mouth. (Patient not taking: Reported on 12/30/2023)     metoprolol  succinate (TOPROL -XL) 25 MG 24 hr tablet Take 1 tablet (25 mg total) by mouth daily. (Patient not taking: Reported on 12/30/2023) 30 tablet 2   metoprolol  tartrate (LOPRESSOR ) 25 MG tablet Take by mouth. (Patient not taking: Reported on 12/30/2023)     No current facility-administered medications for this visit.    Review of Systems  Constitutional:  Positive for fatigue. Negative for appetite change, chills, fever and unexpected weight change.  HENT:   Negative for hearing loss and voice change.   Eyes:  Negative for eye problems and icterus.  Respiratory:  Negative for chest tightness, cough and shortness of breath.   Cardiovascular:  Negative for chest pain and leg swelling.  Gastrointestinal:  Negative for abdominal distention and abdominal pain.  Endocrine: Negative for hot flashes.  Genitourinary:  Negative for difficulty urinating, dysuria and frequency.   Musculoskeletal:  Negative for arthralgias.  Skin:  Negative for itching and rash.  Neurological:  Negative for light-headedness and numbness.  Hematological:  Negative for adenopathy. Does not bruise/bleed easily.  Psychiatric/Behavioral:  Negative for confusion.    PHYSICAL EXAMINATION: ECOG PERFORMANCE STATUS: 1 - Symptomatic but completely ambulatory Vitals:   12/30/23 1350  BP: 124/60   Pulse: 62  Resp: 18  Temp: 99 F (37.2 C)  SpO2: 100%   Filed Weights   12/30/23 1350  Weight: 180 lb (81.6 kg)    Physical Exam Constitutional:      General: He is not in acute distress. HENT:     Head: Normocephalic and atraumatic.  Eyes:     General: No scleral icterus. Cardiovascular:     Rate and Rhythm: Normal rate and regular rhythm.     Heart sounds: Normal heart sounds.  Pulmonary:     Effort: Pulmonary effort is normal. No respiratory distress.     Breath sounds: No wheezing.  Abdominal:     General: Bowel sounds are normal. There is no distension.     Palpations: Abdomen is soft.  Musculoskeletal:        General: No deformity. Normal range of motion.     Cervical back: Normal range of motion and neck supple.     Left lower leg: Edema present.  Skin:    General: Skin is warm and dry.     Findings: No erythema or rash.  Neurological:     Mental Status: He is alert and oriented to person, place, and time. Mental status is at baseline.  Psychiatric:        Mood and Affect: Mood normal.     LABORATORY DATA:  I have reviewed the data as listed    Latest Ref Rng & Units 12/28/2023   10:43 AM 11/18/2023   11:09 AM 10/21/2023   11:27 AM  CBC  WBC 4.0 - 10.5 K/uL 3.7     Hemoglobin 13.0 - 17.0 g/dL 9.1  9.8  9.0   Hematocrit 39.0 - 52.0 % 26.9  28.4  27.2   Platelets 150 - 400 K/uL 100         Latest Ref Rng & Units 08/24/2023   10:13 AM 05/09/2023    4:51 AM 05/08/2023    4:42 AM  CMP  Glucose 70 - 99 mg/dL  890  866   BUN 8 - 23 mg/dL  32  43   Creatinine 9.38 - 1.24 mg/dL  8.66  8.45   Sodium 864 - 145 mmol/L  136  135   Potassium 3.5 - 5.1 mmol/L  4.0  3.9   Chloride 98 - 111 mmol/L  102  101   CO2 22 - 32 mmol/L  25  24   Calcium  8.9 - 10.3 mg/dL  8.7  8.7   Total Protein 6.5 - 8.1 g/dL 6.7     Total Bilirubin 0.0 - 1.2 mg/dL 0.8     Alkaline Phos 38 - 126 U/L 80     AST 15 - 41 U/L 24     ALT 0 - 44 U/L 27         RADIOGRAPHIC  STUDIES: I have personally reviewed the radiological images as listed and agreed with the findings in the report. No results found.

## 2024-01-27 ENCOUNTER — Inpatient Hospital Stay: Attending: Oncology

## 2024-01-27 ENCOUNTER — Inpatient Hospital Stay

## 2024-01-27 DIAGNOSIS — N184 Chronic kidney disease, stage 4 (severe): Secondary | ICD-10-CM | POA: Insufficient documentation

## 2024-01-27 DIAGNOSIS — I130 Hypertensive heart and chronic kidney disease with heart failure and stage 1 through stage 4 chronic kidney disease, or unspecified chronic kidney disease: Secondary | ICD-10-CM | POA: Insufficient documentation

## 2024-01-27 DIAGNOSIS — D631 Anemia in chronic kidney disease: Secondary | ICD-10-CM | POA: Insufficient documentation

## 2024-02-02 ENCOUNTER — Inpatient Hospital Stay

## 2024-02-02 DIAGNOSIS — D631 Anemia in chronic kidney disease: Secondary | ICD-10-CM | POA: Diagnosis present

## 2024-02-02 DIAGNOSIS — I130 Hypertensive heart and chronic kidney disease with heart failure and stage 1 through stage 4 chronic kidney disease, or unspecified chronic kidney disease: Secondary | ICD-10-CM | POA: Diagnosis present

## 2024-02-02 DIAGNOSIS — N184 Chronic kidney disease, stage 4 (severe): Secondary | ICD-10-CM | POA: Diagnosis present

## 2024-02-02 DIAGNOSIS — N1832 Chronic kidney disease, stage 3b: Secondary | ICD-10-CM

## 2024-02-02 LAB — HEMOGLOBIN AND HEMATOCRIT (CANCER CENTER ONLY)
HCT: 29.5 % — ABNORMAL LOW (ref 39.0–52.0)
Hemoglobin: 10.1 g/dL — ABNORMAL LOW (ref 13.0–17.0)

## 2024-02-02 NOTE — Progress Notes (Signed)
 Hgb 10.1 today, no inj needed.

## 2024-02-24 ENCOUNTER — Inpatient Hospital Stay: Attending: Oncology

## 2024-02-24 ENCOUNTER — Inpatient Hospital Stay

## 2024-02-24 VITALS — BP 171/76 | HR 77

## 2024-02-24 DIAGNOSIS — I130 Hypertensive heart and chronic kidney disease with heart failure and stage 1 through stage 4 chronic kidney disease, or unspecified chronic kidney disease: Secondary | ICD-10-CM | POA: Diagnosis present

## 2024-02-24 DIAGNOSIS — N1832 Chronic kidney disease, stage 3b: Secondary | ICD-10-CM

## 2024-02-24 DIAGNOSIS — D631 Anemia in chronic kidney disease: Secondary | ICD-10-CM | POA: Diagnosis present

## 2024-02-24 DIAGNOSIS — N184 Chronic kidney disease, stage 4 (severe): Secondary | ICD-10-CM | POA: Insufficient documentation

## 2024-02-24 LAB — HEMOGLOBIN AND HEMATOCRIT (CANCER CENTER ONLY)
HCT: 28.9 % — ABNORMAL LOW (ref 39.0–52.0)
Hemoglobin: 9.8 g/dL — ABNORMAL LOW (ref 13.0–17.0)

## 2024-02-24 MED ORDER — EPOETIN ALFA-EPBX 40000 UNIT/ML IJ SOLN
40000.0000 [IU] | Freq: Once | INTRAMUSCULAR | Status: AC
  Administered 2024-02-24: 40000 [IU] via SUBCUTANEOUS
  Filled 2024-02-24: qty 1

## 2024-02-25 ENCOUNTER — Other Ambulatory Visit: Payer: Self-pay | Admitting: Physician Assistant

## 2024-02-25 DIAGNOSIS — I4891 Unspecified atrial fibrillation: Secondary | ICD-10-CM

## 2024-02-25 DIAGNOSIS — Z951 Presence of aortocoronary bypass graft: Secondary | ICD-10-CM

## 2024-02-25 DIAGNOSIS — Z8669 Personal history of other diseases of the nervous system and sense organs: Secondary | ICD-10-CM

## 2024-02-25 DIAGNOSIS — R7989 Other specified abnormal findings of blood chemistry: Secondary | ICD-10-CM

## 2024-02-29 ENCOUNTER — Ambulatory Visit (INDEPENDENT_AMBULATORY_CARE_PROVIDER_SITE_OTHER): Admitting: Dermatology

## 2024-02-29 ENCOUNTER — Encounter: Payer: Self-pay | Admitting: Dermatology

## 2024-02-29 ENCOUNTER — Encounter (HOSPITAL_COMMUNITY): Payer: Self-pay

## 2024-02-29 DIAGNOSIS — L905 Scar conditions and fibrosis of skin: Secondary | ICD-10-CM

## 2024-02-29 DIAGNOSIS — D485 Neoplasm of uncertain behavior of skin: Secondary | ICD-10-CM

## 2024-02-29 DIAGNOSIS — L570 Actinic keratosis: Secondary | ICD-10-CM | POA: Diagnosis not present

## 2024-02-29 DIAGNOSIS — C4441 Basal cell carcinoma of skin of scalp and neck: Secondary | ICD-10-CM | POA: Diagnosis not present

## 2024-02-29 DIAGNOSIS — L578 Other skin changes due to chronic exposure to nonionizing radiation: Secondary | ICD-10-CM

## 2024-02-29 DIAGNOSIS — L820 Inflamed seborrheic keratosis: Secondary | ICD-10-CM | POA: Diagnosis not present

## 2024-02-29 DIAGNOSIS — T1490XD Injury, unspecified, subsequent encounter: Secondary | ICD-10-CM

## 2024-02-29 DIAGNOSIS — W908XXA Exposure to other nonionizing radiation, initial encounter: Secondary | ICD-10-CM

## 2024-02-29 DIAGNOSIS — C4491 Basal cell carcinoma of skin, unspecified: Secondary | ICD-10-CM

## 2024-02-29 DIAGNOSIS — Z85828 Personal history of other malignant neoplasm of skin: Secondary | ICD-10-CM

## 2024-02-29 HISTORY — DX: Basal cell carcinoma of skin, unspecified: C44.91

## 2024-02-29 NOTE — Patient Instructions (Addendum)

## 2024-02-29 NOTE — Progress Notes (Signed)
 Follow-Up Visit   Subjective  Christopher Pruitt is a 78 y.o. male who presents for the following: Spot behind the left ear that won't heal, noticed 6 months ago. Has bled in the past. He also has growths on the arms. History of BCC and SCC L lat elbow.   The patient has spots, moles and lesions to be evaluated, some may be new or changing.   The following portions of the chart were reviewed this encounter and updated as appropriate: medications, allergies, medical history  Review of Systems:  No other skin or systemic complaints except as noted in HPI or Assessment and Plan.  Objective  Well appearing patient in no apparent distress; mood and affect are within normal limits.  A focused examination was performed of the following areas: Face, neck, arms  Relevant physical exam findings are noted in the Assessment and Plan.  L infra auricular neck Erythematous stuck-on, waxy papule right postauricular neck 1.1 cm pink white pearly plaque with ulceration  R mid forearm x 1, nasal tip x 1 (2) Keratotic papule.  Assessment & Plan  ACTINIC DAMAGE - chronic, secondary to cumulative UV radiation exposure/sun exposure over time - diffuse scaly erythematous macules with underlying dyspigmentation - Recommend daily broad spectrum sunscreen SPF 30+ to sun-exposed areas, reapply every 2 hours as needed.  - Recommend staying in the shade or wearing long sleeves, sun glasses (UVA+UVB protection) and wide brim hats (4-inch brim around the entire circumference of the hat). - Call for new or changing lesions.  HEALING WOUND WITH EARLY SCAR Exam: Pink smooth patch with focal keratotic scale at the left upper forearm.  Benign-appearing.  Observation.  Call clinic for new or changing lesions. Recommend daily broad spectrum sunscreen SPF 30+, reapply every 2 hours as needed. Treatment: Apply vaseline daily. Scars remodel on their own for a full year and will gradually improve in appearance over  time.  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  - Call if any new or changing lesions are noted between office visits   INFLAMED SEBORRHEIC KERATOSIS L infra auricular neck Symptomatic, irritating, patient would like treated. Destruction of lesion - L infra auricular neck  Destruction method: cryotherapy   Informed consent: discussed and consent obtained   Lesion destroyed using liquid nitrogen: Yes   Region frozen until ice ball extended beyond lesion: Yes   Outcome: patient tolerated procedure well with no complications   Post-procedure details: wound care instructions given   Additional details:  Prior to procedure, discussed risks of blister formation, small wound, skin dyspigmentation, or rare scar following cryotherapy. Recommend Vaseline ointment to treated areas while healing.   NEOPLASM OF UNCERTAIN BEHAVIOR OF SKIN right postauricular neck Epidermal / dermal shaving  Lesion diameter (cm):  1.1 Informed consent: discussed and consent obtained   Patient was prepped and draped in usual sterile fashion: Area prepped with alcohol. Anesthesia: the lesion was anesthetized in a standard fashion   Anesthetic:  1% lidocaine  w/ epinephrine  1-100,000 buffered w/ 8.4% NaHCO3 Instrument used: flexible razor blade   Hemostasis achieved with: pressure, aluminum chloride and electrodesiccation   Outcome: patient tolerated procedure well    Destruction of lesion  Destruction method: electrodesiccation and curettage   Informed consent: discussed and consent obtained  Curettage performed in three different directions: Yes   Electrodesiccation performed over the curetted area: Yes   Final wound size (cm):  1.7 Hemostasis achieved with:   pressure, aluminum chloride and electrodesiccation Outcome: patient tolerated procedure well with no complications   Post-procedure details: wound care instructions given   Post-procedure details comment:  Ointment and bandage applied.  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 They are chronic with expected duration over 1 year. A portion of actinic keratoses will progress to squamous cell carcinoma of the skin. It is not possible to reliably predict which spots will progress to skin cancer and so treatment is recommended to prevent development of skin cancer.  Recommend daily broad spectrum sunscreen SPF 30+ to sun-exposed areas, reapply every 2 hours as needed.  Recommend staying in the shade or wearing long sleeves, sun glasses (UVA+UVB protection) and wide brim hats (4-inch brim around the entire circumference of the hat). Call for new or changing lesions. Destruction of lesion - R mid forearm x 1, nasal tip x 1 (2)  Destruction method: cryotherapy   Informed consent: discussed and consent obtained   Lesion destroyed using liquid nitrogen: Yes   Region frozen until ice ball extended beyond lesion: Yes   Outcome: patient tolerated procedure well with no complications   Post-procedure details: wound care instructions given   Additional details:  Prior to procedure, discussed risks of blister formation, small wound, skin dyspigmentation, or rare scar following cryotherapy. Recommend Vaseline ointment to treated areas while healing.     Return in about 3 months (around 05/31/2024) for UBSE, bx f/u .  IAndrea Kerns, CMA, am acting as scribe for Rexene Rattler, MD .   Documentation: I have reviewed the above documentation for accuracy and  completeness, and I agree with the above.  Rexene Rattler, MD

## 2024-03-01 ENCOUNTER — Other Ambulatory Visit: Payer: Self-pay | Admitting: Specialist

## 2024-03-01 ENCOUNTER — Other Ambulatory Visit: Payer: Self-pay | Admitting: Physician Assistant

## 2024-03-01 ENCOUNTER — Ambulatory Visit
Admission: RE | Admit: 2024-03-01 | Discharge: 2024-03-01 | Disposition: A | Discharge status: Home / Self Care | Source: Ambulatory Visit | Attending: Physician Assistant | Admitting: Physician Assistant

## 2024-03-01 DIAGNOSIS — R7989 Other specified abnormal findings of blood chemistry: Secondary | ICD-10-CM

## 2024-03-01 DIAGNOSIS — R918 Other nonspecific abnormal finding of lung field: Secondary | ICD-10-CM

## 2024-03-01 DIAGNOSIS — Z8669 Personal history of other diseases of the nervous system and sense organs: Secondary | ICD-10-CM | POA: Insufficient documentation

## 2024-03-01 DIAGNOSIS — I4891 Unspecified atrial fibrillation: Secondary | ICD-10-CM | POA: Insufficient documentation

## 2024-03-01 DIAGNOSIS — J849 Interstitial pulmonary disease, unspecified: Secondary | ICD-10-CM

## 2024-03-01 DIAGNOSIS — Z951 Presence of aortocoronary bypass graft: Secondary | ICD-10-CM | POA: Diagnosis not present

## 2024-03-01 DIAGNOSIS — G4733 Obstructive sleep apnea (adult) (pediatric): Secondary | ICD-10-CM

## 2024-03-01 MED ORDER — GADOBUTROL 1 MMOL/ML IV SOLN
11.0000 mL | Freq: Once | INTRAVENOUS | Status: AC | PRN
  Administered 2024-03-01: 11 mL via INTRAVENOUS

## 2024-03-03 LAB — SURGICAL PATHOLOGY

## 2024-03-06 ENCOUNTER — Ambulatory Visit: Payer: Self-pay | Admitting: Dermatology

## 2024-03-06 ENCOUNTER — Encounter: Payer: Self-pay | Admitting: Dermatology

## 2024-03-06 NOTE — Telephone Encounter (Signed)
-----   Message from Rexene Rattler sent at 03/06/2024 11:22 AM EDT ----- 1. Skin, right postauricular neck :       BASAL CELL CARCINOMA, SUPERFICIAL AND NODULAR PATTERNS   BCC skin cancer- already treated with EDC at time of biopsy, recheck at f/up - please call patient ----- Message ----- From: Interface, Lab In [facility] Sent: 03/03/2024   5:25 PM EDT To: Rexene Rattler, MD

## 2024-03-06 NOTE — Telephone Encounter (Signed)
 Advised pt of bx results/sh ?

## 2024-03-20 ENCOUNTER — Ambulatory Visit
Admission: RE | Admit: 2024-03-20 | Discharge: 2024-03-20 | Disposition: A | Discharge status: Home / Self Care | Source: Ambulatory Visit | Attending: Specialist | Admitting: Specialist

## 2024-03-20 DIAGNOSIS — G4733 Obstructive sleep apnea (adult) (pediatric): Secondary | ICD-10-CM | POA: Diagnosis present

## 2024-03-20 DIAGNOSIS — R918 Other nonspecific abnormal finding of lung field: Secondary | ICD-10-CM | POA: Insufficient documentation

## 2024-03-30 ENCOUNTER — Inpatient Hospital Stay: Attending: Oncology

## 2024-03-30 DIAGNOSIS — G4733 Obstructive sleep apnea (adult) (pediatric): Secondary | ICD-10-CM | POA: Diagnosis not present

## 2024-03-30 DIAGNOSIS — I1310 Hypertensive heart and chronic kidney disease without heart failure, with stage 1 through stage 4 chronic kidney disease, or unspecified chronic kidney disease: Secondary | ICD-10-CM | POA: Insufficient documentation

## 2024-03-30 DIAGNOSIS — Z7982 Long term (current) use of aspirin: Secondary | ICD-10-CM | POA: Diagnosis not present

## 2024-03-30 DIAGNOSIS — Z87891 Personal history of nicotine dependence: Secondary | ICD-10-CM | POA: Insufficient documentation

## 2024-03-30 DIAGNOSIS — Z8546 Personal history of malignant neoplasm of prostate: Secondary | ICD-10-CM | POA: Diagnosis not present

## 2024-03-30 DIAGNOSIS — Z85828 Personal history of other malignant neoplasm of skin: Secondary | ICD-10-CM | POA: Diagnosis not present

## 2024-03-30 DIAGNOSIS — I252 Old myocardial infarction: Secondary | ICD-10-CM | POA: Diagnosis not present

## 2024-03-30 DIAGNOSIS — E039 Hypothyroidism, unspecified: Secondary | ICD-10-CM | POA: Insufficient documentation

## 2024-03-30 DIAGNOSIS — E1122 Type 2 diabetes mellitus with diabetic chronic kidney disease: Secondary | ICD-10-CM | POA: Diagnosis not present

## 2024-03-30 DIAGNOSIS — Z800 Family history of malignant neoplasm of digestive organs: Secondary | ICD-10-CM | POA: Diagnosis not present

## 2024-03-30 DIAGNOSIS — Z79890 Hormone replacement therapy (postmenopausal): Secondary | ICD-10-CM | POA: Insufficient documentation

## 2024-03-30 DIAGNOSIS — D472 Monoclonal gammopathy: Secondary | ICD-10-CM | POA: Diagnosis present

## 2024-03-30 DIAGNOSIS — N184 Chronic kidney disease, stage 4 (severe): Secondary | ICD-10-CM | POA: Diagnosis present

## 2024-03-30 DIAGNOSIS — D631 Anemia in chronic kidney disease: Secondary | ICD-10-CM | POA: Diagnosis not present

## 2024-03-30 DIAGNOSIS — Z79899 Other long term (current) drug therapy: Secondary | ICD-10-CM | POA: Diagnosis not present

## 2024-03-30 DIAGNOSIS — N1832 Chronic kidney disease, stage 3b: Secondary | ICD-10-CM

## 2024-03-30 LAB — RETIC PANEL
Immature Retic Fract: 4 % (ref 2.3–15.9)
RBC.: 3.39 MIL/uL — ABNORMAL LOW (ref 4.22–5.81)
Retic Count, Absolute: 25.8 K/uL (ref 19.0–186.0)
Retic Ct Pct: 0.8 % (ref 0.4–3.1)
Reticulocyte Hemoglobin: 30.4 pg (ref >27.9)

## 2024-03-30 LAB — CBC WITH DIFFERENTIAL (CANCER CENTER ONLY)
Abs Immature Granulocytes: 0.02 K/uL (ref 0.00–0.07)
Basophils Absolute: 0 K/uL (ref 0.0–0.1)
Basophils Relative: 1 %
Eosinophils Absolute: 0.1 K/uL (ref 0.0–0.5)
Eosinophils Relative: 1 %
HCT: 29.5 % — ABNORMAL LOW (ref 39.0–52.0)
Hemoglobin: 10.1 g/dL — ABNORMAL LOW (ref 13.0–17.0)
Immature Granulocytes: 1 %
Lymphocytes Relative: 32 %
Lymphs Abs: 1.2 K/uL (ref 0.7–4.0)
MCH: 30 pg (ref 26.0–34.0)
MCHC: 34.2 g/dL (ref 30.0–36.0)
MCV: 87.5 fL (ref 80.0–100.0)
Monocytes Absolute: 0.3 K/uL (ref 0.1–1.0)
Monocytes Relative: 8 %
Neutro Abs: 2.1 K/uL (ref 1.7–7.7)
Neutrophils Relative %: 57 %
Platelet Count: 110 K/uL — ABNORMAL LOW (ref 150–400)
RBC: 3.37 MIL/uL — ABNORMAL LOW (ref 4.22–5.81)
RDW: 16.2 % — ABNORMAL HIGH (ref 11.5–15.5)
WBC Count: 3.7 K/uL — ABNORMAL LOW (ref 4.0–10.5)
nRBC: 0 % (ref 0.0–0.2)

## 2024-03-30 LAB — IRON AND TIBC
Iron: 92 ug/dL (ref 45–182)
Saturation Ratios: 29 % (ref 17.9–39.5)
TIBC: 315 ug/dL (ref 250–450)
UIBC: 223 ug/dL

## 2024-03-30 LAB — FERRITIN: Ferritin: 364 ng/mL — ABNORMAL HIGH (ref 24–336)

## 2024-03-31 LAB — KAPPA/LAMBDA LIGHT CHAINS
Kappa free light chain: 30.2 mg/L — ABNORMAL HIGH (ref 3.3–19.4)
Kappa, lambda light chain ratio: 1.44 (ref 0.26–1.65)
Lambda free light chains: 20.9 mg/L (ref 5.7–26.3)

## 2024-04-01 LAB — MULTIPLE MYELOMA PANEL, SERUM
Albumin SerPl Elph-Mcnc: 3.9 g/dL (ref 2.9–4.4)
Albumin/Glob SerPl: 1.6 (ref 0.7–1.7)
Alpha 1: 0.2 g/dL (ref 0.0–0.4)
Alpha2 Glob SerPl Elph-Mcnc: 0.7 g/dL (ref 0.4–1.0)
B-Globulin SerPl Elph-Mcnc: 0.8 g/dL (ref 0.7–1.3)
Gamma Glob SerPl Elph-Mcnc: 1 g/dL (ref 0.4–1.8)
Globulin, Total: 2.6 g/dL (ref 2.2–3.9)
IgA: 155 mg/dL (ref 61–437)
IgG (Immunoglobin G), Serum: 1022 mg/dL (ref 603–1613)
IgM (Immunoglobulin M), Srm: 70 mg/dL (ref 15–143)
Total Protein ELP: 6.5 g/dL (ref 6.0–8.5)

## 2024-04-04 ENCOUNTER — Inpatient Hospital Stay

## 2024-04-04 ENCOUNTER — Encounter: Payer: Self-pay | Admitting: Oncology

## 2024-04-04 ENCOUNTER — Inpatient Hospital Stay: Admitting: Oncology

## 2024-04-04 VITALS — BP 137/59 | HR 63 | Temp 98.1°F | Resp 18 | Wt 179.5 lb

## 2024-04-04 DIAGNOSIS — D472 Monoclonal gammopathy: Secondary | ICD-10-CM

## 2024-04-04 DIAGNOSIS — N1832 Chronic kidney disease, stage 3b: Secondary | ICD-10-CM | POA: Diagnosis not present

## 2024-04-04 DIAGNOSIS — D631 Anemia in chronic kidney disease: Secondary | ICD-10-CM

## 2024-04-04 NOTE — Assessment & Plan Note (Addendum)
 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 Previous labs were reviewed and discussed with patient. Lab Results  Component Value Date   HGB 10.1 (L) 03/30/2024   TIBC 315 03/30/2024   IRONPCTSAT 29 03/30/2024   FERRITIN 364 (H) 03/30/2024    Hemoglobin is above 10.  Hold off Retacrit .  Ferritin is >200.  Continue oral iron supplementation- Vitron C 1 tablet daily.

## 2024-04-04 NOTE — Progress Notes (Signed)
 Hgb 10.1; Per provider no retacrit  today.

## 2024-04-04 NOTE — Progress Notes (Signed)
 Hematology/Oncology Progress note [phone] [phone]           REFERRING PROVIDER: Cleotilde Oneil FALCON, MD   CHIEF COMPLAINTS/REASON FOR VISIT:  anemia.  MGUS   ASSESSMENT & PLAN:   Anemia in chronic kidney disease (CKD) Anemia is likely secondary to chronic kidney disease. Previous labs were reviewed and discussed with patient. Lab Results  Component Value Date   HGB 10.1 (L) 03/30/2024   TIBC 315 03/30/2024   IRONPCTSAT 29 03/30/2024   FERRITIN 364 (H) 03/30/2024    Hemoglobin is above 10.  Hold off Retacrit .  Ferritin is >200.  Continue oral iron supplementation- Vitron C 1 tablet daily.  CKD (chronic kidney disease) stage 3, GFR 30-59 ml/min (HCC) Encourage oral hydration and avoid nephrotoxins.    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  For now I recommend observation. Check SPEP and light chain ratio every 6 months.     Orders Placed This Encounter  Procedures   Hemoglobin and hematocrit, blood    Standing Status:   Future    Expected Date:   07/25/2024    Expiration Date:   04/04/2025   Hemoglobin and hematocrit, blood    Standing Status:   Future    Expected Date:   06/13/2024    Expiration Date:   04/04/2025   Hemoglobin and hematocrit, blood    Standing Status:   Future    Expected Date:   05/02/2024    Expiration Date:   04/04/2025   CBC with Differential (Cancer Center Only)    Standing Status:   Future    Expected Date:   09/05/2024    Expiration Date:   12/04/2024   Iron and TIBC    Standing Status:   Future    Expected Date:   09/05/2024    Expiration Date:   12/04/2024   Ferritin    Standing Status:   Future    Expected Date:    09/05/2024    Expiration Date:   12/04/2024   Retic Panel    Standing Status:   Future    Expected Date:   09/05/2024    Expiration Date:   12/04/2024   Multiple Myeloma Panel (SPEP&IFE w/QIG)    Standing Status:   Future    Expected Date:   09/05/2024    Expiration Date:   12/04/2024   Kappa/lambda light chains    Standing Status:   Future    Expected Date:   09/05/2024    Expiration Date:   12/04/2024   Follow-up per LOS All questions were answered. The patient knows to call the clinic with any problems, questions or concerns.  Christopher Cap, MD, PhD [facility] Hematology Oncology 04/04/2024   HISTORY OF PRESENTING ILLNESS:   Christopher Pruitt is a  78 y.o.  male with PMH listed below was seen in consultation at the request of  Christopher Oneil FALCON, MD  for evaluation of anemia.   05/06/2023 - 05/10/2023 patient was hospitalized at [facility] due to acute respiratory failure with hypoxia, non-ST elevated myocardial infarction.  Patient was treated with heparin  drip.  Cardiac cath showed diffuse disease.  Patient was also treated for atypical pneumonia. January 2025, patient followed up with cardiology  and was admitted for optimization prior to bypass surgery.  He was found to have severe anemia and received IV Venofer 2 50 mg daily x 4 doses as well as Retacrit  40,000 unit x 12 June 2023, patient underwent coronary artery bypass graft x 3 at [facility]. He received a blood transfusion during this hospitalization due to low hemoglobin levels, which were as low as 6.6 g/dL.  He recalls having multiple blood draws during his hospital stay, which he believes contributed to his anemia.  His most recent hemoglobin level was 8 g/dL.  He has a history of a heart attack in December, followed by a planned triple bypass surgery in February. He has chronic kidney disease stage 4, which was present before his recent heart surgery. This condition may affect erythropoietin production, impacting his anemia  management.  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   INTERVAL HISTORY Christopher Pruitt is a 78 y.o. male who has above history reviewed by me today presents for follow up visit for anemia, MGUS.  Patient is on Retacrit  monthly if hemoglobin is less than 10.  Overall he tolerates well.  No new complaints.  Accompanied by wife.   MEDICAL HISTORY:  Past Medical History:  Diagnosis Date   Actinic keratosis    Anemia    Basal cell carcinoma 02/29/2024   right postauricular neck, EDC   Cancer (HCC)    prostate cancer    COPD with emphysema (HCC)    Diabetes mellitus, type 2 (HCC)    Elevated lipids    Gout    STABLE   Heart murmur    History of kidney stones    History of prostate cancer    Hypertension    Hypothyroidism    Left hydrocele    Lymph edema    Lt leg   Lymphedema of left leg    OSA on CPAP    Right wrist fracture    INJURY 02-01-2014 PT FELL   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/10/2022   L lat elbow - BCC + SCC, ED&C   Urge urinary incontinence    Wears glasses    Wears hearing aid    BILATERAL    SURGICAL HISTORY: Past Surgical History:  Procedure Laterality Date   CARPAL TUNNEL RELEASE Left 10/25/2018   Procedure: CARPAL TUNNEL RELEASE ENDOSCOPIC LEFT, DIABETIC, SLEEPAPNEA;  Surgeon: Christopher Norleen PARAS, MD;  Location: [facility] ORS;  Service: Orthopedics;  Laterality: Left;   CARPAL TUNNEL RELEASE Right 02/01/2019   Procedure: CARPAL TUNNEL RELEASE ENDOSCOPIC;  Surgeon: Christopher Norleen PARAS, MD;  Location: [facility] ORS;  Service: Orthopedics;  Laterality: Right;   colonoscopy with polypectomy     COLONOSCOPY WITH PROPOFOL  N/A 07/27/2018   Procedure: COLONOSCOPY WITH PROPOFOL ;  Surgeon: Toledo,  Ladell POUR, MD;  Location: [facility] ENDOSCOPY;  Service: Gastroenterology;  Laterality: N/A;   heart bypass surgery  06/17/2023   HYDROCELE EXCISION Left 02/11/2015   Procedure: LEFT HYDROCELECTOMY ADULT;  Surgeon: Redell Lynwood Napoleon, MD;  Location: [facility];  Service: Urology;  Laterality: Left;   INGUINAL HERNIA REPAIR Bilateral 04/18/2021   Procedure: LAPAROSCOPIC BILATERAL  HERNIA REPAIR WITH MESH, TRANSABDOMINAL PLANE BLOCK, LEFT INCARCARATED SCROTAL HERNIA, RIGHT INGUINAL  HERNIA REPAIR;  Surgeon: Sheldon Standing, MD;  Location: [facility] ORS;  Service: General;  Laterality: Bilateral;   JOINT REPLACEMENT     Rt knee   LEFT HEART CATH AND CORONARY ANGIOGRAPHY N/A 05/10/2023   Procedure: LEFT HEART CATH AND CORONARY ANGIOGRAPHY;  Surgeon: Ammon Blunt, MD;  Location: [facility] INVASIVE CV LAB;  Service: Cardiovascular;  Laterality: N/A;   PARTIAL KNEE ARTHROPLASTY Right 2012   PLACEMENT GOLD STUDS IN PROSTATE  2014   [place]   for [facility] (prostate cancer)   SHOULDER OPEN ROTATOR CUFF REPAIR Left 2010   TONSILLECTOMY  as child    SOCIAL HISTORY: Social History   Socioeconomic History   Marital status: Married    Spouse name: Not on file   Number of children: Not on file   Years of education: Not on file   Highest education level: Not on file  Occupational History   Not on file  Tobacco Use   Smoking status: Former    Current packs/day: 0.00    Types: Cigarettes    Start date: 55    Quit date: 1990    Years since quitting: 35.9    Passive exposure: Past   Smokeless tobacco: Never  Vaping Use   Vaping status: Never Used  Substance and Sexual Activity   Alcohol use: No   Drug use: No   Sexual activity: Not on file  Other Topics Concern   Not on file  Social History Narrative   Not on file   Social Drivers of Health   Financial Resource Strain: Low Risk  (02/28/2024)   Received from [facility]   Overall Financial  Resource Strain (CARDIA)    Difficulty of Paying Living Expenses: Not hard at all  Food Insecurity: No Food Insecurity (02/28/2024)   Received from [facility]   Hunger Vital Sign    Within the past 12 months, you worried that your food would run out before you got the money to buy more.: Never true    Within the past 12 months, the food you bought just didn't last and you didn't have money to get more.: Never true  Transportation Needs: No Transportation Needs (02/28/2024)   Received from [facility] - Transportation    In the past 12 months, has lack of transportation kept you from medical appointments or from getting medications?: No    Lack of Transportation (Non-Medical): No  Physical Activity: Not on file  Stress: Not on file  Social Connections: Not on file  Intimate Partner Violence: Not At Risk (05/08/2023)   Humiliation, Afraid, Rape, and Kick questionnaire    Fear of Current or Ex-Partner: No    Emotionally Abused: No    Physically Abused: No    Sexually Abused: No    FAMILY HISTORY: Family History  Problem Relation Age of Onset   Colon cancer Mother    Heart attack Father     ALLERGIES:  is allergic to amoxicillin-pot clavulanate, oxycodone, clindamycin /lincomycin, nalfon [fenoprofen calcium ], and robaxin [methocarbamol].  MEDICATIONS:  Current Outpatient Medications  Medication Sig Dispense Refill   acetaminophen  (TYLENOL ) 650 MG CR tablet Take 650 mg by mouth every 8 (eight) hours as needed for pain.     albuterol  (VENTOLIN  HFA) 108 (90 Base) MCG/ACT inhaler Inhale 2 puffs into the lungs every 6 (six) hours as needed for wheezing or shortness of breath. 8 g 2   aspirin  EC 81 MG tablet Take 1  tablet (81 mg total) by mouth daily. Swallow whole. 150 tablet 1   atorvastatin (LIPITOR) 40 MG tablet Take by mouth.     donepezil (ARICEPT) 5 MG tablet Take 5 mg by mouth at bedtime.     FLUoxetine  (PROZAC ) 20 MG capsule Take by  mouth.     furosemide  (LASIX ) 20 MG tablet Take 1 tablet (20 mg total) by mouth daily. 30 tablet 1   ipratropium-albuterol  (DUONEB) 0.5-2.5 (3) MG/3ML SOLN Take 3 mLs by nebulization every 6 (six) hours as needed (wheezing, shortness of breath). 180 mL 1   levothyroxine  (SYNTHROID , LEVOTHROID) 125 MCG tablet Take 125 mcg by mouth daily before breakfast.     lisinopril  (ZESTRIL ) 10 MG tablet Take 1 tablet (10 mg total) by mouth daily. 30 tablet 2   midodrine (PROAMATINE) 5 MG tablet Take 1 tablet by mouth 2 (two) times daily.     ONETOUCH ULTRA TEST test strip USE 1 EACH (1 STRIP TOTAL) ONCE DAILY USE AS INSTRUCTED.     oxybutynin  (DITROPAN  XL) 15 MG 24 hr tablet Take 15 mg by mouth at bedtime.     tamsulosin (FLOMAX) 0.4 MG CAPS capsule Take 0.4 mg by mouth daily after breakfast.     amiodarone (PACERONE) 200 MG tablet Take by mouth. (Patient not taking: Reported on 04/04/2024)     metoprolol  succinate (TOPROL -XL) 25 MG 24 hr tablet Take 1 tablet (25 mg total) by mouth daily. (Patient not taking: Reported on 04/04/2024) 30 tablet 2   metoprolol  tartrate (LOPRESSOR ) 25 MG tablet Take by mouth. (Patient not taking: Reported on 04/04/2024)     No current facility-administered medications for this visit.    Review of Systems  Constitutional:  Positive for fatigue. Negative for appetite change, chills, fever and unexpected weight change.  HENT:   Negative for hearing loss and voice change.   Eyes:  Negative for eye problems and icterus.  Respiratory:  Negative for chest tightness, cough and shortness of breath.   Cardiovascular:  Negative for chest pain and leg swelling.  Gastrointestinal:  Negative for abdominal distention and abdominal pain.  Endocrine: Negative for hot flashes.  Genitourinary:  Negative for difficulty urinating, dysuria and frequency.   Musculoskeletal:  Negative for arthralgias.  Skin:  Negative for itching and rash.  Neurological:  Negative for light-headedness and  numbness.  Hematological:  Negative for adenopathy. Does not bruise/bleed easily.  Psychiatric/Behavioral:  Negative for confusion.    PHYSICAL EXAMINATION: ECOG PERFORMANCE STATUS: 1 - Symptomatic but completely ambulatory Vitals:   04/04/24 1309  BP: (!) 137/59  Pulse: 63  Resp: 18  Temp: 98.1 F (36.7 C)  SpO2: 100%   Filed Weights   04/04/24 1309  Weight: 179 lb 8 oz (81.4 kg)    Physical Exam Constitutional:      General: He is not in acute distress. HENT:     Head: Normocephalic and atraumatic.  Eyes:     General: No scleral icterus. Cardiovascular:     Rate and Rhythm: Normal rate and regular rhythm.     Heart sounds: Normal heart sounds.  Pulmonary:     Effort: Pulmonary effort is normal. No respiratory distress.     Breath sounds: No wheezing.  Abdominal:     General: Bowel sounds are normal. There is no distension.     Palpations: Abdomen is soft.  Musculoskeletal:        General: No deformity. Normal range of motion.     Cervical back: Normal range of motion  and neck supple.     Left lower leg: Edema present.  Skin:    General: Skin is warm and dry.     Findings: No erythema or rash.  Neurological:     Mental Status: He is alert and oriented to person, place, and time. Mental status is at baseline.  Psychiatric:        Mood and Affect: Mood normal.     LABORATORY DATA:  I have reviewed the data as listed    Latest Ref Rng & Units 03/30/2024   11:05 AM 02/24/2024   11:05 AM 02/02/2024   11:10 AM  CBC  WBC 4.0 - 10.5 K/uL 3.7     Hemoglobin 13.0 - 17.0 g/dL 89.8  9.8  89.8   Hematocrit 39.0 - 52.0 % 29.5  28.9  29.5   Platelets 150 - 400 K/uL 110         Latest Ref Rng & Units 08/24/2023   10:13 AM 05/09/2023    4:51 AM 05/08/2023    4:42 AM  CMP  Glucose 70 - 99 mg/dL  890  866   BUN 8 - 23 mg/dL  32  43   Creatinine 9.38 - 1.24 mg/dL  8.66  8.45   Sodium 864 - 145 mmol/L  136  135   Potassium 3.5 - 5.1 mmol/L  4.0  3.9   Chloride 98 -  111 mmol/L  102  101   CO2 22 - 32 mmol/L  25  24   Calcium  8.9 - 10.3 mg/dL  8.7  8.7   Total Protein 6.5 - 8.1 g/dL 6.7     Total Bilirubin 0.0 - 1.2 mg/dL 0.8     Alkaline Phos 38 - 126 U/L 80     AST 15 - 41 U/L 24     ALT 0 - 44 U/L 27         RADIOGRAPHIC STUDIES: I have personally reviewed the radiological images as listed and agreed with the findings in the report. CT CHEST WO CONTRAST Result Date: 03/22/2024 CLINICAL DATA:  Chronic lung issues history of sleep apnea on CPAP EXAM: CT CHEST WITHOUT CONTRAST TECHNIQUE: Multidetector CT imaging of the chest was performed following the standard protocol without IV contrast. RADIATION DOSE REDUCTION: This exam was performed according to the departmental dose-optimization program which includes automated exposure control, adjustment of the mA and/or kV according to patient size and/or use of iterative reconstruction technique. COMPARISON:  Chest CT 05/06/2023, 03/10/2023, 10/28/2022 FINDINGS: Cardiovascular: Limited assessment without intravenous contrast. Moderate aortic atherosclerosis. Post CABG changes. 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 Multi-vessel coronary vascular calcification. Mild cardiomegaly. No significant pericardial effusion. Mediastinum/Nodes: Patent trachea. No thyroid mass. No suspicious lymph nodes. Esophagus within normal limits. Lungs/Pleura: No acute airspace disease, pleural effusion or pneumothorax. Moderate bilateral peripheral subpleural reticulation and mild bronchiectasis as was seen on prior exams. This appears generally stable compared with CT from October 2024. Stable subpleural left lower lobe pulmonary nodule on series 3, image 111, consistent with benign nodule. No acute airspace disease, pleural effusion or pneumothorax. Upper Abdomen: No acute finding. Stable cyst in the right kidney, no imaging follow-up is recommended. Musculoskeletal: Sternotomy. Multilevel  degenerative changes of the spine. No acute osseous abnormality IMPRESSION: 1. No CT evidence for acute intrathoracic abnormality. 2. 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 These findings are stable  compared with the CT from October 2024. Stable benign-appearing 4 mm left lower lobe pulmonary nodule for which no additional follow-up imaging is recommended 3. Aortic atherosclerosis. 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 Multi-vessel coronary vascular disease. Aortic Atherosclerosis (ICD10-I70.0). Electronically Signed   By: Luke Bun M.D.   On: 03/22/2024 21:10   MR CARDIAC MORPHOLOGY W WO CONTRAST Result Date: 03/01/2024 CLINICAL DATA:  Evaluate for amyloidosis, hx of CAD/CABG EXAM: CARDIAC MRI TECHNIQUE: The patient was scanned on a 1.5 Tesla Siemens magnet. A dedicated cardiac coil was used. Functional imaging was done using Fiesta sequences. 2,3, and 4 chamber views were done to assess for RWMA's. Modified Simpson's rule using a short axis stack was used to calculate an ejection fraction on a dedicated work [employer]. The patient received 11 cc of Gadavist . After 10 minutes inversion recovery sequences were used to assess for infiltration and scar tissue. Velocity flow mapping performed in the ascending aorta and main pulmonary artery. CONTRAST:  11 cc  of Gadavist  FINDINGS: 1. Mildly dilated left ventricular size, normal thickness and normal systolic function (LVEF = 57%). There are no regional wall motion abnormalities. There is no late gadolinium enhancement in the left ventricular myocardium. LVEDV: 231 ml LVESV: 100 ml SV: 131 ml CO: 5.4 L/min Myocardial mass: 171 g LV native T1 value 1031 ms (normal <1000 ms) LV ECV value 24 % (normal <30%) 2. Normal right ventricular size, thickness and systolic function  (RVEF = 52%). There are no regional wall motion abnormalities. 3.  Normal left and right atrial size. 4. Normal size of the aortic root, ascending aorta and pulmonary artery. 5. Mild mitral regurgitation, mild tricuspid regurgitation, Mild aortic regurgitation. Aortic valve calcification 6.  Normal pericardium.  No pericardial effusion. IMPRESSION: 1.  Normal LV systolic function, LVEF 57%. 2.  No LGE or scar. 3.  Normal ECV, no evidence for infiltrative disease. 4.  Normal RV systolic function. 5.  No evidence for amyloidosis. Electronically Signed   By: Redell Cave M.D.   On: 03/01/2024 17:29   MR CARDIAC VELOCITY FLOW MAP Result Date: 03/01/2024 CLINICAL DATA:  Evaluate for amyloidosis, hx of CAD/CABG EXAM: CARDIAC MRI TECHNIQUE: The patient was scanned on a 1.5 Tesla Siemens magnet. A dedicated cardiac coil was used. Functional imaging was done using Fiesta sequences. 2,3, and 4 chamber views were done to assess for RWMA's. Modified Simpson's rule using a short axis stack was used to calculate an ejection fraction on a dedicated work [employer]. The patient received 11 cc of Gadavist . After 10 minutes inversion recovery sequences were used to assess for infiltration and scar tissue. Velocity flow mapping performed in the ascending aorta and main pulmonary artery. CONTRAST:  11 cc  of Gadavist  FINDINGS: 1. Mildly dilated left ventricular size, normal thickness and normal systolic function (LVEF = 57%). There are no regional wall motion abnormalities. There is no late gadolinium enhancement in the left ventricular myocardium. LVEDV: 231 ml LVESV: 100 ml SV: 131 ml CO: 5.4 L/min Myocardial mass: 171 g LV native T1 value 1031 ms (normal <1000 ms) LV ECV value 24 % (normal <30%) 2. Normal right ventricular size, thickness and systolic function (RVEF = 52%). There are no regional wall motion abnormalities. 3.  Normal left and right atrial size. 4. Normal size of the aortic root, ascending  aorta and pulmonary artery. 5. Mild mitral regurgitation, mild tricuspid regurgitation, Mild aortic regurgitation. Aortic valve calcification  6.  Normal pericardium.  No pericardial effusion. IMPRESSION: 1.  Normal LV systolic function, LVEF 57%. 2.  No LGE or scar. 3.  Normal ECV, no evidence for infiltrative disease. 4.  Normal RV systolic function. 5.  No evidence for amyloidosis. Electronically Signed   By: Redell Cave M.D.   On: 03/01/2024 17:29   MR CARDIAC VELOCITY FLOW MAP Result Date: 03/01/2024 CLINICAL DATA:  Evaluate for amyloidosis, hx of CAD/CABG EXAM: CARDIAC MRI TECHNIQUE: The patient was scanned on a 1.5 Tesla Siemens magnet. A dedicated cardiac coil was used. Functional imaging was done using Fiesta sequences. 2,3, and 4 chamber views were done to assess for RWMA's. Modified Simpson's rule using a short axis stack was used to calculate an ejection fraction on a dedicated work [employer]. The patient received 11 cc of Gadavist . After 10 minutes inversion recovery sequences were used to assess for infiltration and scar tissue. Velocity flow mapping performed in the ascending aorta and main pulmonary artery. CONTRAST:  11 cc  of Gadavist  FINDINGS: 1. Mildly dilated left ventricular size, normal thickness and normal systolic function (LVEF = 57%). There are no regional wall motion abnormalities. There is no late gadolinium enhancement in the left ventricular myocardium. LVEDV: 231 ml LVESV: 100 ml SV: 131 ml CO: 5.4 L/min Myocardial mass: 171 g LV native T1 value 1031 ms (normal <1000 ms) LV ECV value 24 % (normal <30%) 2. Normal right ventricular size, thickness and systolic function (RVEF = 52%). There are no regional wall motion abnormalities. 3.  Normal left and right atrial size. 4. Normal size of the aortic root, ascending aorta and pulmonary artery. 5. Mild mitral regurgitation, mild tricuspid regurgitation, Mild aortic regurgitation. Aortic valve calcification 6.   Normal pericardium.  No pericardial effusion. IMPRESSION: 1.  Normal LV systolic function, LVEF 57%. 2.  No LGE or scar. 3.  Normal ECV, no evidence for infiltrative disease. 4.  Normal RV systolic function. 5.  No evidence for amyloidosis. Electronically Signed   By: Redell Cave M.D.   On: 03/01/2024 17:29

## 2024-04-04 NOTE — Assessment & Plan Note (Signed)
 Lab Results  Component Value Date   MPROTEIN Not Observed 03/30/2024   KPAFRELGTCHN 30.2 (H) 03/30/2024   LAMBDASER 20.9 03/30/2024   KAPLAMBRATIO 1.44 03/30/2024   IgG lamda MGUS NT proBNP was significantly elevated.-Cardiac MRI showed no evidence of amyloidosis.  For now I recommend observation. Check SPEP and light chain ratio every 6 months.

## 2024-04-04 NOTE — Assessment & Plan Note (Signed)
 Encourage oral hydration and avoid nephrotoxins.

## 2024-05-02 ENCOUNTER — Inpatient Hospital Stay: Attending: Oncology

## 2024-05-02 ENCOUNTER — Inpatient Hospital Stay

## 2024-05-02 DIAGNOSIS — D472 Monoclonal gammopathy: Secondary | ICD-10-CM | POA: Diagnosis present

## 2024-05-02 DIAGNOSIS — D631 Anemia in chronic kidney disease: Secondary | ICD-10-CM

## 2024-05-02 DIAGNOSIS — I1310 Hypertensive heart and chronic kidney disease without heart failure, with stage 1 through stage 4 chronic kidney disease, or unspecified chronic kidney disease: Secondary | ICD-10-CM | POA: Diagnosis present

## 2024-05-02 DIAGNOSIS — N184 Chronic kidney disease, stage 4 (severe): Secondary | ICD-10-CM | POA: Insufficient documentation

## 2024-05-02 LAB — HEMOGLOBIN AND HEMATOCRIT, BLOOD
HCT: 29.3 % — ABNORMAL LOW (ref 39.0–52.0)
Hemoglobin: 10 g/dL — ABNORMAL LOW (ref 13.0–17.0)

## 2024-05-02 NOTE — Progress Notes (Signed)
 Pt does not need injection due to hemoglobin being 10.0 per 04/04/24 note from Dr. Melanee.

## 2024-05-30 ENCOUNTER — Ambulatory Visit: Admitting: Dermatology

## 2024-06-04 ENCOUNTER — Other Ambulatory Visit: Payer: Self-pay

## 2024-06-04 ENCOUNTER — Inpatient Hospital Stay
Admission: EM | Admit: 2024-06-04 | Discharge: 2024-06-06 | DRG: 158 | Disposition: A | Attending: Internal Medicine | Admitting: Internal Medicine

## 2024-06-04 ENCOUNTER — Encounter: Payer: Self-pay | Admitting: Emergency Medicine

## 2024-06-04 ENCOUNTER — Emergency Department

## 2024-06-04 DIAGNOSIS — F0393 Unspecified dementia, unspecified severity, with mood disturbance: Secondary | ICD-10-CM | POA: Diagnosis present

## 2024-06-04 DIAGNOSIS — J449 Chronic obstructive pulmonary disease, unspecified: Secondary | ICD-10-CM | POA: Diagnosis present

## 2024-06-04 DIAGNOSIS — Z8546 Personal history of malignant neoplasm of prostate: Secondary | ICD-10-CM

## 2024-06-04 DIAGNOSIS — Z6827 Body mass index (BMI) 27.0-27.9, adult: Secondary | ICD-10-CM

## 2024-06-04 DIAGNOSIS — E039 Hypothyroidism, unspecified: Secondary | ICD-10-CM | POA: Diagnosis present

## 2024-06-04 DIAGNOSIS — K122 Cellulitis and abscess of mouth: Secondary | ICD-10-CM | POA: Diagnosis not present

## 2024-06-04 DIAGNOSIS — F411 Generalized anxiety disorder: Secondary | ICD-10-CM | POA: Diagnosis present

## 2024-06-04 DIAGNOSIS — F0394 Unspecified dementia, unspecified severity, with anxiety: Secondary | ICD-10-CM | POA: Diagnosis present

## 2024-06-04 DIAGNOSIS — N400 Enlarged prostate without lower urinary tract symptoms: Secondary | ICD-10-CM | POA: Diagnosis present

## 2024-06-04 DIAGNOSIS — D696 Thrombocytopenia, unspecified: Secondary | ICD-10-CM | POA: Diagnosis present

## 2024-06-04 DIAGNOSIS — J439 Emphysema, unspecified: Secondary | ICD-10-CM | POA: Diagnosis present

## 2024-06-04 DIAGNOSIS — E1122 Type 2 diabetes mellitus with diabetic chronic kidney disease: Secondary | ICD-10-CM | POA: Diagnosis present

## 2024-06-04 DIAGNOSIS — Z974 Presence of external hearing-aid: Secondary | ICD-10-CM

## 2024-06-04 DIAGNOSIS — E663 Overweight: Secondary | ICD-10-CM | POA: Diagnosis present

## 2024-06-04 DIAGNOSIS — K047 Periapical abscess without sinus: Secondary | ICD-10-CM | POA: Diagnosis present

## 2024-06-04 DIAGNOSIS — Z8249 Family history of ischemic heart disease and other diseases of the circulatory system: Secondary | ICD-10-CM

## 2024-06-04 DIAGNOSIS — C61 Malignant neoplasm of prostate: Secondary | ICD-10-CM | POA: Diagnosis present

## 2024-06-04 DIAGNOSIS — Z7982 Long term (current) use of aspirin: Secondary | ICD-10-CM

## 2024-06-04 DIAGNOSIS — I10 Essential (primary) hypertension: Secondary | ICD-10-CM | POA: Diagnosis present

## 2024-06-04 DIAGNOSIS — G4733 Obstructive sleep apnea (adult) (pediatric): Secondary | ICD-10-CM | POA: Diagnosis present

## 2024-06-04 DIAGNOSIS — D630 Anemia in neoplastic disease: Secondary | ICD-10-CM | POA: Diagnosis present

## 2024-06-04 DIAGNOSIS — N1831 Chronic kidney disease, stage 3a: Secondary | ICD-10-CM | POA: Diagnosis present

## 2024-06-04 DIAGNOSIS — Z794 Long term (current) use of insulin: Secondary | ICD-10-CM

## 2024-06-04 DIAGNOSIS — Z800 Family history of malignant neoplasm of digestive organs: Secondary | ICD-10-CM

## 2024-06-04 DIAGNOSIS — Z79890 Hormone replacement therapy (postmenopausal): Secondary | ICD-10-CM

## 2024-06-04 DIAGNOSIS — E1169 Type 2 diabetes mellitus with other specified complication: Secondary | ICD-10-CM | POA: Diagnosis present

## 2024-06-04 DIAGNOSIS — K219 Gastro-esophageal reflux disease without esophagitis: Secondary | ICD-10-CM | POA: Diagnosis present

## 2024-06-04 DIAGNOSIS — E782 Mixed hyperlipidemia: Secondary | ICD-10-CM | POA: Diagnosis present

## 2024-06-04 DIAGNOSIS — E1165 Type 2 diabetes mellitus with hyperglycemia: Secondary | ICD-10-CM | POA: Diagnosis present

## 2024-06-04 DIAGNOSIS — Z96651 Presence of right artificial knee joint: Secondary | ICD-10-CM | POA: Diagnosis present

## 2024-06-04 DIAGNOSIS — N183 Chronic kidney disease, stage 3 unspecified: Secondary | ICD-10-CM | POA: Diagnosis present

## 2024-06-04 DIAGNOSIS — Z87891 Personal history of nicotine dependence: Secondary | ICD-10-CM

## 2024-06-04 DIAGNOSIS — I129 Hypertensive chronic kidney disease with stage 1 through stage 4 chronic kidney disease, or unspecified chronic kidney disease: Secondary | ICD-10-CM | POA: Diagnosis present

## 2024-06-04 DIAGNOSIS — I2510 Atherosclerotic heart disease of native coronary artery without angina pectoris: Secondary | ICD-10-CM | POA: Diagnosis present

## 2024-06-04 DIAGNOSIS — F329 Major depressive disorder, single episode, unspecified: Secondary | ICD-10-CM | POA: Diagnosis present

## 2024-06-04 DIAGNOSIS — J4489 Other specified chronic obstructive pulmonary disease: Secondary | ICD-10-CM | POA: Diagnosis present

## 2024-06-04 DIAGNOSIS — Z85828 Personal history of other malignant neoplasm of skin: Secondary | ICD-10-CM

## 2024-06-04 DIAGNOSIS — I480 Paroxysmal atrial fibrillation: Secondary | ICD-10-CM | POA: Diagnosis present

## 2024-06-04 DIAGNOSIS — Z951 Presence of aortocoronary bypass graft: Secondary | ICD-10-CM

## 2024-06-04 DIAGNOSIS — Z87442 Personal history of urinary calculi: Secondary | ICD-10-CM

## 2024-06-04 DIAGNOSIS — D472 Monoclonal gammopathy: Secondary | ICD-10-CM | POA: Diagnosis present

## 2024-06-04 DIAGNOSIS — L03211 Cellulitis of face: Principal | ICD-10-CM | POA: Diagnosis present

## 2024-06-04 LAB — BASIC METABOLIC PANEL WITH GFR
Anion gap: 11 (ref 5–15)
BUN: 27 mg/dL — ABNORMAL HIGH (ref 8–23)
CO2: 26 mmol/L (ref 22–32)
Calcium: 9.7 mg/dL (ref 8.9–10.3)
Chloride: 102 mmol/L (ref 98–111)
Creatinine, Ser: 1.54 mg/dL — ABNORMAL HIGH (ref 0.61–1.24)
GFR, Estimated: 45 mL/min — ABNORMAL LOW
Glucose, Bld: 190 mg/dL — ABNORMAL HIGH (ref 70–99)
Potassium: 4.3 mmol/L (ref 3.5–5.1)
Sodium: 139 mmol/L (ref 135–145)

## 2024-06-04 LAB — CBC WITH DIFFERENTIAL/PLATELET
Abs Immature Granulocytes: 0.04 10*3/uL (ref 0.00–0.07)
Basophils Absolute: 0 10*3/uL (ref 0.0–0.1)
Basophils Relative: 0 %
Eosinophils Absolute: 0.1 10*3/uL (ref 0.0–0.5)
Eosinophils Relative: 1 %
HCT: 28 % — ABNORMAL LOW (ref 39.0–52.0)
Hemoglobin: 9.7 g/dL — ABNORMAL LOW (ref 13.0–17.0)
Immature Granulocytes: 1 %
Lymphocytes Relative: 15 %
Lymphs Abs: 0.9 10*3/uL (ref 0.7–4.0)
MCH: 29.8 pg (ref 26.0–34.0)
MCHC: 34.6 g/dL (ref 30.0–36.0)
MCV: 85.9 fL (ref 80.0–100.0)
Monocytes Absolute: 0.6 10*3/uL (ref 0.1–1.0)
Monocytes Relative: 10 %
Neutro Abs: 4.6 10*3/uL (ref 1.7–7.7)
Neutrophils Relative %: 73 %
Platelets: 114 10*3/uL — ABNORMAL LOW (ref 150–400)
RBC: 3.26 MIL/uL — ABNORMAL LOW (ref 4.22–5.81)
RDW: 16.5 % — ABNORMAL HIGH (ref 11.5–15.5)
Smear Review: NORMAL
WBC: 6.2 10*3/uL (ref 4.0–10.5)
nRBC: 0 % (ref 0.0–0.2)

## 2024-06-04 LAB — MAGNESIUM: Magnesium: 1.6 mg/dL — ABNORMAL LOW (ref 1.7–2.4)

## 2024-06-04 LAB — LACTIC ACID, PLASMA: Lactic Acid, Venous: 1.2 mmol/L (ref 0.5–1.9)

## 2024-06-04 LAB — HEMOGLOBIN A1C
Hgb A1c MFr Bld: 6.4 % — ABNORMAL HIGH (ref 4.8–5.6)
Mean Plasma Glucose: 136.98 mg/dL

## 2024-06-04 LAB — CBG MONITORING, ED
Glucose-Capillary: 110 mg/dL — ABNORMAL HIGH (ref 70–99)
Glucose-Capillary: 238 mg/dL — ABNORMAL HIGH (ref 70–99)

## 2024-06-04 MED ORDER — ACETAMINOPHEN 650 MG RE SUPP: 650.0000 mg | Freq: Four times a day (QID) | RECTAL | Status: DC | PRN

## 2024-06-04 MED ORDER — HEPARIN SODIUM (PORCINE) 5000 UNIT/ML IJ SOLN
5000.0000 [IU] | Freq: Three times a day (TID) | INTRAMUSCULAR | Status: DC
  Administered 2024-06-04 – 2024-06-06 (×6): 5000 [IU] via SUBCUTANEOUS
  Filled 2024-06-04 (×6): qty 1

## 2024-06-04 MED ORDER — SODIUM CHLORIDE 0.9 % IV BOLUS
250.0000 mL | Freq: Once | INTRAVENOUS | Status: AC
  Administered 2024-06-04: 250 mL via INTRAVENOUS

## 2024-06-04 MED ORDER — ACETAMINOPHEN 325 MG PO TABS
650.0000 mg | ORAL_TABLET | Freq: Four times a day (QID) | ORAL | Status: DC | PRN
  Filled 2024-06-04: qty 2

## 2024-06-04 MED ORDER — SENNOSIDES-DOCUSATE SODIUM 8.6-50 MG PO TABS
1.0000 | ORAL_TABLET | Freq: Every evening | ORAL | Status: DC | PRN
  Filled 2024-06-04: qty 1

## 2024-06-04 MED ORDER — INSULIN ASPART 100 UNIT/ML IJ SOLN
0.0000 [IU] | Freq: Three times a day (TID) | INTRAMUSCULAR | Status: DC
  Administered 2024-06-05 (×2): 1 [IU] via SUBCUTANEOUS
  Filled 2024-06-04 (×2): qty 1

## 2024-06-04 MED ORDER — HYDROMORPHONE HCL 1 MG/ML IJ SOLN: 0.2500 mg | INTRAMUSCULAR | Status: DC | PRN

## 2024-06-04 MED ORDER — IOHEXOL 300 MG/ML  SOLN
80.0000 mL | Freq: Once | INTRAMUSCULAR | Status: AC | PRN
  Administered 2024-06-04: 80 mL via INTRAVENOUS

## 2024-06-04 MED ORDER — HYDROMORPHONE HCL 2 MG PO TABS
2.0000 mg | ORAL_TABLET | ORAL | Status: DC | PRN
  Administered 2024-06-04 – 2024-06-05 (×3): 2 mg via ORAL
  Filled 2024-06-04 (×3): qty 1

## 2024-06-04 MED ORDER — MELATONIN 5 MG PO TABS
5.0000 mg | ORAL_TABLET | Freq: Once | ORAL | Status: AC
  Administered 2024-06-04: 5 mg via ORAL
  Filled 2024-06-04: qty 1

## 2024-06-04 MED ORDER — SODIUM CHLORIDE 0.9% FLUSH
3.0000 mL | Freq: Two times a day (BID) | INTRAVENOUS | Status: DC
  Administered 2024-06-04 – 2024-06-05 (×4): 3 mL via INTRAVENOUS

## 2024-06-04 MED ORDER — SODIUM CHLORIDE 0.9 % IV SOLN
3.0000 g | Freq: Four times a day (QID) | INTRAVENOUS | Status: DC
  Administered 2024-06-04 – 2024-06-06 (×7): 3 g via INTRAVENOUS
  Filled 2024-06-04 (×10): qty 8

## 2024-06-04 MED ORDER — ONDANSETRON HCL 4 MG/2ML IJ SOLN: 4.0000 mg | Freq: Four times a day (QID) | INTRAMUSCULAR | Status: DC | PRN

## 2024-06-04 MED ORDER — SODIUM CHLORIDE 0.9 % IV SOLN
3.0000 g | Freq: Once | INTRAVENOUS | Status: AC
  Administered 2024-06-04: 3 g via INTRAVENOUS
  Filled 2024-06-04: qty 8

## 2024-06-04 MED ORDER — ONDANSETRON HCL 4 MG PO TABS: 4.0000 mg | ORAL_TABLET | Freq: Four times a day (QID) | ORAL | Status: DC | PRN

## 2024-06-04 MED ORDER — INSULIN ASPART 100 UNIT/ML IJ SOLN
0.0000 [IU] | Freq: Every day | INTRAMUSCULAR | Status: DC
  Administered 2024-06-04: 2 [IU] via SUBCUTANEOUS
  Filled 2024-06-04: qty 2

## 2024-06-04 NOTE — H&P (Signed)
 " History and Physical    Christopher Pruitt FMW:[id number] DOB: [birth year] DOA: 06/04/2024  DOS: the patient was seen and examined on 06/04/2024  PCP: Christopher Oneil FALCON, MD   Patient coming from: Home  I have personally briefly reviewed patient's old medical records in [facility] and CareEverywhere  HPI:   Christopher Pruitt is a 79 y.o. year old male with medical history of HTN, HLD c/b CAD s/p CABG, paroxsymal atrial fibrillation, T2DM, CKDIIIa, hypothyroidism, COPD, and prostate cancer s/p treatment in 2014 presenting to the ED with worsening right sided facial swelling. The swelling has been present for 3 days but started worsening last night.  States he has been having to take for over 1 month which she had plan to get checked out.  He denies any difficulty with swallowing and states he has not eaten anything since 3 AM this morning.  Denies any fevers or chills.  Denies any URI symptoms.  On arrival to the ED patient was noted to be HDS stable. Lab work and imaging obtained. CBC without leukocytosis, mild anemia near baseline. Thrombocytopenia that is stable and chronic. BMP with stable renal function and moderate hyperglycemia. LA checked and normal. CT maxillofacial obtained and showed right maxillary first molar abscess with associated phlegmon and significant surrounding tissue swelling c/w cellulitis. Blood cultures ordered. Pt given unasyn . Case discussed by Dr. Nicholaus with [facility] oral surgery Dr. Jinx who recommends admission at [facility] and treatment with IV abx and outpatient visit to [facility] on Tuesday. Given this, [facility] contacted for admission.  Review of Systems: As mentioned in the history of present illness. All other systems reviewed and are negative.   Past Medical History:  Diagnosis Date   Actinic keratosis    Anemia    Basal cell carcinoma 02/29/2024   right postauricular neck, EDC   Cancer (HCC)    prostate cancer    COPD with emphysema (HCC)    Diabetes mellitus, type 2 (HCC)     Elevated lipids    Gout    STABLE   Heart murmur    History of kidney stones    History of prostate cancer    Hypertension    Hypothyroidism    Left hydrocele    Lymph edema    Lt leg   Lymphedema of left leg    OSA on CPAP    Right wrist fracture    INJURY 02-01-2014 PT FELL   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/10/2022   L lat elbow - BCC + SCC, ED&C   Urge urinary incontinence    Wears glasses    Wears hearing aid    BILATERAL    Past Surgical History:  Procedure Laterality Date   CARPAL TUNNEL RELEASE Left 10/25/2018   Procedure: CARPAL TUNNEL RELEASE ENDOSCOPIC LEFT, DIABETIC, SLEEPAPNEA;  Surgeon: Edie Norleen PARAS, MD;  Location: [facility] ORS;  Service: Orthopedics;  Laterality: Left;   CARPAL TUNNEL RELEASE Right 02/01/2019   Procedure: CARPAL TUNNEL RELEASE ENDOSCOPIC;  Surgeon: Edie Norleen PARAS, MD;  Location: [facility] ORS;  Service: Orthopedics;  Laterality: Right;   colonoscopy with polypectomy     COLONOSCOPY WITH PROPOFOL  N/A 07/27/2018   Procedure: COLONOSCOPY WITH PROPOFOL ;  Surgeon: Toledo, Ladell POUR, MD;  Location: [facility] ENDOSCOPY;  Service: Gastroenterology;  Laterality: N/A;   heart bypass surgery  06/17/2023   HYDROCELE EXCISION Left 02/11/2015   Procedure: LEFT HYDROCELECTOMY ADULT;  Surgeon: Redell Lynwood Napoleon, MD;  [facility]: [facility];  Service: Urology;  Laterality: Left;  INGUINAL HERNIA REPAIR Bilateral 04/18/2021   Procedure: LAPAROSCOPIC BILATERAL  HERNIA REPAIR WITH MESH, TRANSABDOMINAL PLANE BLOCK, LEFT INCARCARATED SCROTAL HERNIA, RIGHT INGUINAL HERNIA REPAIR;  Surgeon: Sheldon Standing, MD;  Location: [facility] ORS;  Service: General;  Laterality: Bilateral;   JOINT REPLACEMENT     Rt knee   LEFT HEART CATH AND CORONARY ANGIOGRAPHY N/A 05/10/2023   Procedure: LEFT HEART CATH AND CORONARY ANGIOGRAPHY;  Surgeon: Ammon Blunt, MD;  Location: [facility] INVASIVE CV LAB;  Service: Cardiovascular;  Laterality: N/A;   PARTIAL KNEE ARTHROPLASTY Right 2012   PLACEMENT GOLD  STUDS IN PROSTATE  2014   [place]   for [facility] Radiation (prostate cancer)   SHOULDER OPEN ROTATOR CUFF REPAIR Left 2010   TONSILLECTOMY  as child     Allergies[1]  Family History  Problem Relation Age of Onset   Colon cancer Mother    Heart attack Father     Prior to Admission medications  Medication Sig Start Date End Date Taking? Authorizing Provider  acetaminophen  (TYLENOL ) 650 MG CR tablet Take 650 mg by mouth every 8 (eight) hours as needed for pain.   Yes [provider]  aspirin  EC 81 MG tablet Take 81 mg by mouth daily. Swallow whole.   Yes [provider]  atorvastatin (LIPITOR) 40 MG tablet Take by mouth at bedtime. 06/24/23 06/23/24 Yes [provider]  colchicine 0.6 MG tablet Take 0.6 mg by mouth daily as needed. 05/19/24  Yes [provider]  donepezil (ARICEPT) 5 MG tablet Take 5 mg by mouth at bedtime. 02/28/24 02/27/25 Yes [provider]  FLUoxetine  (PROZAC ) 20 MG capsule Take by mouth. 06/23/23  Yes [provider]  levothyroxine  (SYNTHROID , LEVOTHROID) 125 MCG tablet Take 125 mcg by mouth daily before breakfast.   Yes [provider]  methylPREDNISolone  (MEDROL  DOSEPAK) 4 MG TBPK tablet Take 4 mg by mouth See admin instructions. follow package directions 05/19/24  Yes [provider]  metoprolol  tartrate (LOPRESSOR ) 25 MG tablet Take by mouth. Patient taking differently: Take 12.5 mg by mouth 2 (two) times daily. 06/23/23 06/22/24 Yes [provider]  tamsulosin (FLOMAX) 0.4 MG CAPS capsule Take 0.4 mg by mouth daily after breakfast. 06/23/23 06/22/24 Yes [provider]  albuterol  (VENTOLIN  HFA) 108 (90 Base) MCG/ACT inhaler Inhale 2 puffs into the lungs every 6 (six) hours as needed for wheezing or shortness of breath. Patient not taking: Reported on 06/04/2024 05/10/23   Amin, Sumayya, MD  amiodarone (PACERONE) 200 MG tablet Take by mouth. Patient not taking: Reported on  04/04/2024 06/23/23 08/24/23  [provider]  furosemide  (LASIX ) 20 MG tablet Take 1 tablet (20 mg total) by mouth daily. Patient not taking: Reported on 06/04/2024 05/11/23   Amin, Sumayya, MD  ipratropium-albuterol  (DUONEB) 0.5-2.5 (3) MG/3ML SOLN Take 3 mLs by nebulization every 6 (six) hours as needed (wheezing, shortness of breath). Patient not taking: Reported on 06/04/2024 05/10/23   Amin, Sumayya, MD  lisinopril  (ZESTRIL ) 10 MG tablet Take 1 tablet (10 mg total) by mouth daily. Patient not taking: Reported on 06/04/2024 05/11/23   Amin, Sumayya, MD  metoprolol  succinate (TOPROL -XL) 25 MG 24 hr tablet Take 1 tablet (25 mg total) by mouth daily. Patient not taking: Reported on 08/24/2023 05/11/23   Amin, Sumayya, MD  midodrine (PROAMATINE) 5 MG tablet Take 1 tablet by mouth 2 (two) times daily. Patient not taking: Reported on 06/04/2024 07/07/23 07/06/24  [provider]  AISHA FLING TEST test strip USE 1 EACH (1  STRIP TOTAL) ONCE DAILY USE AS INSTRUCTED. 07/06/22   [provider]  oxybutynin  (DITROPAN  XL) 15 MG 24 hr tablet Take 15 mg by mouth at bedtime. Patient not taking: Reported on 06/04/2024 06/23/23   [provider]    Social History:  reports that he quit smoking about 36 years ago. His smoking use included cigarettes. He started smoking about 48 years ago. He has been exposed to tobacco smoke. He has never used smokeless tobacco. He reports that he does not drink alcohol and does not use drugs.    Physical Exam: Vitals:   06/04/24 1020 06/04/24 1022 06/04/24 1054 06/04/24 1428  BP:  (!) 144/58    Pulse: 70     Resp: 16     Temp: 98 F (36.7 C)   97.8 F (36.6 C)  TempSrc: Oral   Oral  SpO2: 98%  98%   Weight:      Height:        Gen: NAD, has hearing loss at baseline HENT: Right facial swelling starting 2 inches below the right eye. No TTP of the orbit over the periorbital region.  TTP of the right cheek which is swollen. CV: Regular  rate and rhythm Lung: CTAB, no stridor present Abd: No TTP, normal bowel sounds MSK: No asymmetry, good bulk and tone Neuro: alert and oriented x 4   Labs on Admission: I have personally reviewed following labs and imaging studies  CBC: Recent Labs  Lab 06/04/24 1054  WBC 6.2  NEUTROABS 4.6  HGB 9.7*  HCT 28.0*  MCV 85.9  PLT 114*   Basic Metabolic Panel: Recent Labs  Lab 06/04/24 1054 06/04/24 1431  NA 139  --   K 4.3  --   CL 102  --   CO2 26  --   GLUCOSE 190*  --   BUN 27*  --   CREATININE 1.54*  --   CALCIUM  9.7  --   MG  --  1.6*   GFR: Estimated Creatinine Clearance: 39.1 mL/min (A) (by C-G formula based on SCr of 1.54 mg/dL (H)). Liver Function Tests: No results for input(s): AST, ALT, ALKPHOS, BILITOT, PROT, ALBUMIN  in the last 168 hours. No results for input(s): LIPASE, AMYLASE in the last 168 hours. No results for input(s): AMMONIA in the last 168 hours. Coagulation Profile: No results for input(s): INR, PROTIME in the last 168 hours. Cardiac Enzymes: No results for input(s): CKTOTAL, CKMB, CKMBINDEX, TROPONINI, TROPONINIHS in the last 168 hours. BNP (last 3 results) No results for input(s): BNP in the last 8760 hours. HbA1C: No results for input(s): HGBA1C in the last 72 hours. CBG: No results for input(s): GLUCAP in the last 168 hours. Lipid Profile: No results for input(s): CHOL, HDL, LDLCALC, TRIG, CHOLHDL, LDLDIRECT in the last 72 hours. Thyroid Function Tests: No results for input(s): TSH, T4TOTAL, FREET4, T3FREE, THYROIDAB in the last 72 hours. Anemia Panel: No results for input(s): VITAMINB12, FOLATE, FERRITIN, TIBC, IRON, RETICCTPCT in the last 72 hours. Urine analysis:    Component Value Date/Time   COLORURINE YELLOW 03/16/2023 1353   APPEARANCEUR CLEAR 03/16/2023 1353   APPEARANCEUR Clear 05/09/2012 0933   LABSPEC >1.030 (H) 03/16/2023 1353   LABSPEC 1.028  05/09/2012 0933   PHURINE 5.0 03/16/2023 1353   GLUCOSEU 250 (A) 03/16/2023 1353   GLUCOSEU >=500 05/09/2012 0933   HGBUR TRACE (A) 03/16/2023 1353   BILIRUBINUR SMALL (A) 03/16/2023 1353   BILIRUBINUR Negative 05/09/2012 0933   KETONESUR TRACE (A) 03/16/2023  1353   PROTEINUR 100 (A) 03/16/2023 1353   NITRITE NEGATIVE 03/16/2023 1353   LEUKOCYTESUR NEGATIVE 03/16/2023 1353   LEUKOCYTESUR Negative 05/09/2012 0933    Radiological Exams on Admission: I have personally reviewed images CT Maxillofacial W Contrast Result Date: 06/04/2024 EXAM: CT Facial Bones with contrast 06/04/2024 12:04:17 PM TECHNIQUE: CT of the facial bones was performed with the administration of 80 mL of iohexol  (OMNIPAQUE ) 300 MG/ML solution. Multiplanar reformatted images are provided for review. Automated exposure control, iterative reconstruction, and/or weight based adjustment of the mA/kV was utilized to reduce the radiation dose to as low as reasonably achievable. COMPARISON: None available CLINICAL HISTORY: Right facial swelling and edema are likely coming from a right maxillary molar. FINDINGS: AERODIGESTIVE TRACT: Asymmetric thickening of the right platysma. SALIVARY GLANDS: No acute abnormality. LYMPH NODES: Mildly prominent subcentimeter cervical lymph nodes in the right neck which are likely reactive. SOFT TISSUES: There is an adjacent 0.7 x 0.4 x 1.1 cm enhancing fluid collection compatible with abscess best seen on series 3, image 46 and series 7, image 41. There is significant surrounding soft tissue swelling involving the right buccal space as well as significant stranding in the subcutaneous tissues of the right face. Inflammatory changes in the subcutaneous tissues extend from the inferolateral margin of the right orbit into the right anterior neck. There is associated skin thickening throughout this region. Asymmetric thickening of the right platysma is noted. There are areas of soft tissue swelling along the  right lateral and anterior aspects of the mandible and additional ill defined hypoattenuation along the right anterolateral aspect of the maxilla likely reflecting phlegmon. BRAIN, ORBITS AND SINUSES: BRAIN: No acute abnormality. ORBITS: No acute abnormality. SINUSES: Mucosal thickening in the alveolar recesses of the right greater than left maxillary sinuses which are likely odontogenic in origin. No paranasal air fluid levels. BONES: Poor dentition. Lucency involving the right maxillary first molar. Areas of sclerosis along the maxilla and mandible particularly in the regions of the molars likely related to chronic dental disease. Rightward deviation of the nasal septum. No acute fracture. No suspicious bone lesion. VASCULATURE: Atherosclerosis at the carotid bifurcations. IMPRESSION: 1. Right maxillary first molar abscess with associated phlegmon and significant surrounding soft tissue swelling. 2. Facial soft tissue swelling extending from the inferolateral margin of the right orbit into the right anterior neck, concerning for cellulitis. 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  Started on Unasyn  and will continue this.  Case has been discussed with maxillofacial surgery at [facility] and they recommend patient present to their clinic on  Tuesday and they will extract the tooth.  They recommend admission for IV antibiotics to help decrease the swelling has swelling limits any interventions currently.  Blood cultures have been ordered.  Will monitor leukocyte count, fever curve and blood cultures.  Replace liquid diet and advance as swelling decreases.  Will admit patient to progressive level of care.  Chronic Problems: Restart home meds once medication reconciliation is completed.  HTN: Patient is hypertensive.  Continue home meds Paroxysmal A-fib: Continue home amiodarone and metoprolol . HLD: continue home meds T2DM: hold home meds, start on SSI and titrate GERD: continue home PPI Hypothyroidism: continue home synthroid  COPD/Asthma: continue home inhalers MDD/GAD: continue home meds CKD: monitor renal fxn daily, and renally dose meds, avoid nephrotoxic meds IDA: continue home meds.  Patient follows with hematology oncology. BPH: Continue home tamsulosin Dementia: Continue home donepezil OSA: Holding home CPAP given facial swelling.  VTE prophylaxis:  SQ Heparin   Diet: NPO Code Status:  Full Code Telemetry:  Admission status: Inpatient, Progressive Patient is from: Home Anticipated d/c is to: Home Anticipated d/c is in: 2-3 days   Family Communication: Updated at bedside  Consults called: None   Severity of Illness: The appropriate patient status for this patient is INPATIENT. Inpatient status is judged to be reasonable and necessary in order to provide the required intensity of service to ensure the patient's safety. The patient's presenting symptoms, physical exam findings, and initial radiographic and laboratory data in the context of their chronic comorbidities is felt to place them at high risk for further clinical deterioration. Furthermore, it is not anticipated that the patient will be medically stable for discharge from the hospital within 2 midnights of admission.   * I certify that at the point of  admission it is my clinical judgment that the patient will require inpatient hospital care spanning beyond 2 midnights from the point of admission due to high intensity of service, high risk for further deterioration and high frequency of surveillance required.DEWAINE Morene Bathe, MD Jolynn DEL. [facility]      [1]  Allergies Allergen Reactions   Amoxicillin -Pot Clavulanate Diarrhea and Nausea And Vomiting    Did it involve swelling of the face/tongue/throat, SOB, or low BP? No  Did it involve sudden or severe rash/hives, skin peeling, or any reaction on the inside of your mouth or nose? No  Did you need to seek medical attention at a hospital or doctor's office? No  When did it last happen?      last year or so  If all above answers are NO, may proceed with cephalosporin use.  Did it involve swelling of the face/tongue/throat, SOB, or low BP? No  Did it involve sudden or severe rash/hives, skin peeling, or any reaction on the inside of your mouth or nose? No  Did you need to seek medical attention at a hospital or doctor's office? No  When did it last happen?      last year or so  If all above answers are NO, may proceed with cephalosporin use.   Oxycodone Other (See Comments)    Confusion   Clindamycin /Lincomycin Itching   Nalfon [Fenoprofen Calcium ] Hives   Robaxin [Methocarbamol] Hives   "

## 2024-06-04 NOTE — ED Triage Notes (Signed)
 Pt to ED via POV from home for right sided facial swelling. Pt states that the swelling started last night but got worse over night. Pt is not have a problem swallowing or breathing. Pt wife states that she can feel a pocket on the side of his gum. Pt is in NAD.

## 2024-06-04 NOTE — ED Provider Notes (Signed)
 "  [facility] Provider Note    Event Date/Time   First MD Initiated Contact with Patient 06/04/24 1024     (approximate)   History   Facial Swelling   HPI  Christopher Pruitt is a 79 y.o. male  with pmh of HTN, HLD c/b CAD s/p CABG, paroxsymal atrial fibrillation, T2DM, CKDIIIa, hypothyroidism, COPD, and prostate cancer who presents to the emergency department with 1 day of right facial swelling.  Patient tells me that he has had right maxillary molar pain for the past week despite going to a dentist regularly.  Yesterday he noticed increasing redness of the right face and cheek and subsequently this became very edematous.  He denies any difficulty handling his secretions or any tongue edema.  He denies any chest pain shortness of breath or fevers.  He presents with his wife who contributes to the history      Physical Exam   Triage Vital Signs: ED Triage Vitals  Encounter Vitals Group     BP 06/04/24 1022 (!) 144/58     Girls Systolic BP Percentile --      Girls Diastolic BP Percentile --      Boys Systolic BP Percentile --      Boys Diastolic BP Percentile --      Pulse Rate 06/04/24 1020 70     Resp 06/04/24 1020 16     Temp 06/04/24 1020 98 F (36.7 C)     Temp Source 06/04/24 1020 Oral     SpO2 06/04/24 1020 98 %     Weight 06/04/24 1019 179 lb 7.3 oz (81.4 kg)     Height 06/04/24 1019 5' 7 (1.702 m)     Head Circumference --      Peak Flow --      Pain Score 06/04/24 1019 4     Pain Loc --      Pain Education --      Exclude from Growth Chart --     Most recent vital signs: Vitals:   06/04/24 1428 06/04/24 1913  BP:    Pulse:  96  Resp:    Temp: 97.8 F (36.6 C)   SpO2:      Nursing Triage Note reviewed. Vital signs reviewed and patients oxygen saturation is normoxic  General: Patient is well nourished, well developed, awake and alert, resting comfortably in no acute distress Head: Normocephalic and atraumatic Eyes: Normal  inspection, extraocular muscles intact, no conjunctival pallor Ear, nose, throat:    Floor mouth is soft, tongue and uvula nonedematous, patient is tender to palpation over right maxillary molar  Neck: Normal range of motion Respiratory: Patient is in no respiratory distress, lungs CTAB Cardiovascular: Patient is not tachycardic, RRR without murmur appreciated GI: Abd SNT with no guarding or rebound  Back: Normal inspection of the back with good strength and range of motion throughout all ext Extremities: pulses intact with good cap refills, no LE pitting edema or calf tenderness Neuro: The patient is alert and oriented to person, place, and time, appropriately conversive, with 5/5 bilat UE/LE strength, no gross motor or sensory defects noted. Coordination appears to be adequate. Skin: Warm, dry, and intact Psych: normal mood and affect, no SI or HI  ED Results / Procedures / Treatments   Labs (all labs ordered are listed, but only abnormal results are displayed) Labs Reviewed  CBC WITH DIFFERENTIAL/PLATELET - Abnormal; Notable for the following components:      Result Value  RBC 3.26 (*)    Hemoglobin 9.7 (*)    HCT 28.0 (*)    RDW 16.5 (*)    Platelets 114 (*)    All other components within normal limits  BASIC METABOLIC PANEL WITH GFR - Abnormal; Notable for the following components:   Glucose, Bld 190 (*)    BUN 27 (*)    Creatinine, Ser 1.54 (*)    GFR, Estimated 45 (*)    All other components within normal limits  MAGNESIUM  - Abnormal; Notable for the following components:   Magnesium  1.6 (*)    All other components within normal limits  CBG MONITORING, ED - Abnormal; Notable for the following components:   Glucose-Capillary 110 (*)    All other components within normal limits  CULTURE, BLOOD (ROUTINE X 2)  CULTURE, BLOOD (ROUTINE X 2)  LACTIC ACID, PLASMA  HEMOGLOBIN A1C  BASIC METABOLIC PANEL WITH GFR  CBC     EKG None  RADIOLOGY CT max face: Consistent  with facial cellulitis with phlegmon and dental abscess on my independent review interpretation radiologist agrees    PROCEDURES:  Critical Care performed: No  Procedures   MEDICATIONS ORDERED IN ED: Medications  sodium chloride  flush (NS) 0.9 % injection 3 mL (3 mLs Intravenous Given 06/04/24 1428)  acetaminophen  (TYLENOL ) tablet 650 mg (has no administration in time range)    Or  acetaminophen  (TYLENOL ) suppository 650 mg (has no administration in time range)  senna-docusate (Senokot-S) tablet 1 tablet (has no administration in time range)  heparin  injection 5,000 Units (5,000 Units Subcutaneous Given 06/04/24 1421)  ondansetron  (ZOFRAN ) tablet 4 mg (has no administration in time range)    Or  ondansetron  (ZOFRAN ) injection 4 mg (has no administration in time range)  insulin  aspart (novoLOG ) injection 0-9 Units (0 Units Subcutaneous Hold 06/04/24 1630)  insulin  aspart (novoLOG ) injection 0-5 Units (has no administration in time range)  Ampicillin -Sulbactam (UNASYN ) 3 g in sodium chloride  0.9 % 100 mL IVPB (0 g Intravenous Stopped 06/04/24 1658)  HYDROmorphone  (DILAUDID ) tablet 2 mg (2 mg Oral Given 06/04/24 1624)  HYDROmorphone  (DILAUDID ) injection 0.25-0.5 mg (has no administration in time range)  Ampicillin -Sulbactam (UNASYN ) 3 g in sodium chloride  0.9 % 100 mL IVPB (0 g Intravenous Stopped 06/04/24 1129)  sodium chloride  0.9 % bolus 250 mL (0 mLs Intravenous Stopped 06/04/24 1217)  iohexol  (OMNIPAQUE ) 300 MG/ML solution 80 mL (80 mLs Intravenous Contrast Given 06/04/24 1204)     IMPRESSION / MDM / ASSESSMENT AND PLAN / ED COURSE                                Differential diagnosis includes, but is not limited to: Facial cellulitis facial abscess, dental abscess, electrolyte derangement anemia  ED course: Patient presents with obvious right facial infection but no concern for any airway compromise.  An IV was inserted and he was initiated on Unasyn .  CT face demonstrated facial  cellulitis with a dental abscess.  Given that we do not have OMFS here at our facility his case was discussed with OMFS consultant at [facility] (Dr. Delford) who states that the patient does not need transfer to their facility but agrees with hospitalist admission for the next day or 2 to ensure that patient's swelling does go down.  Patient can be seen as early as Tuesday and [facility] clinic and just needs to show up or call and tell them that Dr. Georganna approved the  situation.  Case discussed with hospitalist for admission   Clinical Course as of 06/04/24 1916  [place] Jun 04, 2024  1229 Will reach out to OMFS at [facility] to determine if patient needs transfer or can stay at our facility for iv abx [HD]  1253 Case discussed with [facility] OMFS department children Dr. Delford.  He says keep the patient here and continue IV antibiotics.  They would not take him to the OR this weekend anyway.  Have the patient follow-up on Tuesday as an outpatient in the [facility] OMFS clinic [HD]  1323 Case discussed with hospitalist for admission [HD]    Clinical Course User Index [HD] Nicholaus Rolland BRAVO, MD   -- Risk: 5 This patient has a high risk of morbidity due to further diagnostic testing or treatment. Rationale: This patients evaluation and management involve a high risk of morbidity due to the potential severity of presenting symptoms, need for diagnostic testing, and/or initiation of treatment that may require close monitoring. The differential includes conditions with potential for significant deterioration or requiring escalation of care. Treatment decisions in the ED, including medication administration, procedural interventions, or disposition planning, reflect this level of risk. COPA: 5 The patient has the following acute or chronic illness/injury that poses a possible threat to life or bodily function: [X] : The patient has a potentially serious acute condition or an acute exacerbation of a chronic illness requiring urgent evaluation  and management in the Emergency Department. The clinical presentation necessitates immediate consideration of life-threatening or function-threatening diagnoses, even if they are ultimately ruled out.   FINAL CLINICAL IMPRESSION(S) / ED DIAGNOSES   Final diagnoses:  Facial cellulitis  Dental abscess     Rx / DC Orders   ED Discharge Orders     None        Note:  This document was prepared using Dragon voice recognition software and may include unintentional dictation errors.   Nicholaus Rolland BRAVO, MD 06/04/24 1916  "

## 2024-06-05 DIAGNOSIS — K122 Cellulitis and abscess of mouth: Secondary | ICD-10-CM | POA: Diagnosis not present

## 2024-06-05 DIAGNOSIS — K047 Periapical abscess without sinus: Secondary | ICD-10-CM | POA: Diagnosis present

## 2024-06-05 DIAGNOSIS — N1831 Chronic kidney disease, stage 3a: Secondary | ICD-10-CM

## 2024-06-05 DIAGNOSIS — D472 Monoclonal gammopathy: Secondary | ICD-10-CM | POA: Diagnosis not present

## 2024-06-05 LAB — BASIC METABOLIC PANEL WITH GFR
Anion gap: 14 (ref 5–15)
BUN: 22 mg/dL (ref 8–23)
CO2: 22 mmol/L (ref 22–32)
Calcium: 9.4 mg/dL (ref 8.9–10.3)
Chloride: 102 mmol/L (ref 98–111)
Creatinine, Ser: 1.29 mg/dL — ABNORMAL HIGH (ref 0.61–1.24)
GFR, Estimated: 56 mL/min — ABNORMAL LOW
Glucose, Bld: 147 mg/dL — ABNORMAL HIGH (ref 70–99)
Potassium: 4 mmol/L (ref 3.5–5.1)
Sodium: 138 mmol/L (ref 135–145)

## 2024-06-05 LAB — CBC
HCT: 27.4 % — ABNORMAL LOW (ref 39.0–52.0)
Hemoglobin: 9.4 g/dL — ABNORMAL LOW (ref 13.0–17.0)
MCH: 29.4 pg (ref 26.0–34.0)
MCHC: 34.3 g/dL (ref 30.0–36.0)
MCV: 85.6 fL (ref 80.0–100.0)
Platelets: 116 10*3/uL — ABNORMAL LOW (ref 150–400)
RBC: 3.2 MIL/uL — ABNORMAL LOW (ref 4.22–5.81)
RDW: 16.3 % — ABNORMAL HIGH (ref 11.5–15.5)
WBC: 6.8 10*3/uL (ref 4.0–10.5)
nRBC: 0 % (ref 0.0–0.2)

## 2024-06-05 LAB — CBG MONITORING, ED
Glucose-Capillary: 128 mg/dL — ABNORMAL HIGH (ref 70–99)
Glucose-Capillary: 146 mg/dL — ABNORMAL HIGH (ref 70–99)

## 2024-06-05 LAB — SEDIMENTATION RATE
Sed Rate: 46 mm/h — ABNORMAL HIGH (ref 0–20)
Sed Rate: 35 mm/h — ABNORMAL HIGH (ref 0–20)

## 2024-06-05 LAB — GLUCOSE, CAPILLARY
Glucose-Capillary: 114 mg/dL — ABNORMAL HIGH (ref 70–99)
Glucose-Capillary: 100 mg/dL — ABNORMAL HIGH (ref 70–99)

## 2024-06-05 LAB — C-REACTIVE PROTEIN
CRP: 4.5 mg/dL — ABNORMAL HIGH
CRP: 5.3 mg/dL — ABNORMAL HIGH

## 2024-06-05 MED ORDER — MELATONIN 5 MG PO TABS
5.0000 mg | ORAL_TABLET | Freq: Once | ORAL | Status: AC
  Administered 2024-06-05: 5 mg via ORAL
  Filled 2024-06-05: qty 1

## 2024-06-05 NOTE — TOC CM/SW Note (Signed)
 Transition of Care [facility]) - Inpatient Brief Assessment   Patient Details  Name: Christopher Pruitt MRN: [id number] Date of Birth: 09-12-1945  Transition of Care [facility]) CM/SW Contact:    Corean ONEIDA Haddock, RN Phone Number: 06/05/2024, 2:44 PM   Clinical Narrative:  Transition of Care Department [facility]) has reviewed patient and no TOC needs have been identified at this time.  If new patient transition needs arise, please place a TOC consult.   Transition of Care Asessment: Insurance and Status: Insurance coverage has been reviewed Patient has primary care physician: Yes     Prior/Current Home Services: No current home services Social Drivers of Health Review: SDOH reviewed no interventions necessary Readmission risk has been reviewed: Yes Transition of care needs: no transition of care needs at this time

## 2024-06-05 NOTE — Plan of Care (Signed)

## 2024-06-05 NOTE — Hospital Course (Signed)
 Christopher Pruitt is a 79 y.o. year old male with medical history of HTN, HLD c/b CAD s/p CABG, paroxsymal atrial fibrillation, T2DM, CKDIIIa, hypothyroidism, COPD, and prostate cancer s/p treatment in 2014 presenting to the ED with worsening right sided facial swelling.   Patient was found to have a dental abscess, started on antibiotics.  Oral surgery will perform tooth extraction soon. Condition has improved today, medically stable for discharge.  [facility] oral surgery had called patient wife yesterday, he will follow-up on Wednesday for tooth extraction.

## 2024-06-05 NOTE — Progress Notes (Signed)
" °  Progress Note   Patient: Christopher Pruitt FMW:[id number] DOB: [birth year] DOA: 06/04/2024     1 DOS: the patient was seen and examined on 06/05/2024   Brief hospital course: Christopher Pruitt is a 79 y.o. year old male with medical history of HTN, HLD c/b CAD s/p CABG, paroxsymal atrial fibrillation, T2DM, CKDIIIa, hypothyroidism, COPD, and prostate cancer s/p treatment in 2014 presenting to the ED with worsening right sided facial swelling.   Patient was found to have a dental abscess, started on antibiotics.  Oral surgery will perform tooth extraction soon.   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 Per recommendation from oral surgery, will continue IV antibiotics for another day. Patient probably can discharge home tomorrow. Call [facility] [address] Dr. Delford office.  The office is closed on Monday and Tuesday due to weather.  Will need to call office on Wednesday to schedule for 2 subtraction.  But the patient can be discharged home tomorrow if her condition continue to improve.  Chronic kidney disease stage IIIa. Renal function still stable.  COPD. Condition stable.  Type 2 diabetes. Glucose mostly under control, will continue sliding scale insulin .  Anemia with thrombocytopenia. IgG monoclonal gammopathy of undetermined significance. Order patient follow-up with PCP versus oncology    Subjective:  Patient feels better today, right facial swelling better, no fever or chills.  Physical Exam: Vitals:   06/05/24 0521 06/05/24 0710 06/05/24 0730 06/05/24 1030  BP: (!) 115/55  (!) 110/54 (!) 111/58   Pulse: 64  (!) 54 (!) 59  Resp: 16  18 18   Temp: 98 F (36.7 C)   98.3 F (36.8 C)  TempSrc: Oral   Oral  SpO2: 97% 94% 94% 98%  Weight:      Height:       General exam: Appears calm and comfortable  Respiratory system: Clear to auscultation. Respiratory effort normal. Cardiovascular system: S1 & S2 heard, RRR. No JVD, murmurs, rubs, gallops or clicks. No pedal edema. Gastrointestinal system: Abdomen is nondistended, soft and nontender. No organomegaly or masses felt. Normal bowel sounds heard. Central nervous system: Alert and oriented. No focal neurological deficits. Extremities: Symmetric 5 x 5 power. Skin: No rashes, lesions or ulcers Psychiatry: Judgement and insight appear normal. Mood & affect appropriate.  Right facial swelling still present.  Data Reviewed:  CT scan the lab results reviewed.  Family Communication: Wife updated at the bedside.  Disposition: Status is: Inpatient Remains inpatient appropriate because: Severity of disease, IV treatment.     Time spent: 35 minutes  Author: Murvin Mana, MD 06/05/2024 10:55 AM  For on call review [email or web address].    "

## 2024-06-06 ENCOUNTER — Encounter: Payer: Self-pay | Admitting: Oncology

## 2024-06-06 ENCOUNTER — Other Ambulatory Visit: Payer: Self-pay

## 2024-06-06 DIAGNOSIS — E663 Overweight: Secondary | ICD-10-CM | POA: Insufficient documentation

## 2024-06-06 DIAGNOSIS — D472 Monoclonal gammopathy: Secondary | ICD-10-CM | POA: Diagnosis not present

## 2024-06-06 DIAGNOSIS — K122 Cellulitis and abscess of mouth: Secondary | ICD-10-CM | POA: Diagnosis not present

## 2024-06-06 DIAGNOSIS — N1831 Chronic kidney disease, stage 3a: Secondary | ICD-10-CM | POA: Diagnosis not present

## 2024-06-06 LAB — CBC
HCT: 26.9 % — ABNORMAL LOW (ref 39.0–52.0)
Hemoglobin: 9.2 g/dL — ABNORMAL LOW (ref 13.0–17.0)
MCH: 29.5 pg (ref 26.0–34.0)
MCHC: 34.2 g/dL (ref 30.0–36.0)
MCV: 86.2 fL (ref 80.0–100.0)
Platelets: 105 10*3/uL — ABNORMAL LOW (ref 150–400)
RBC: 3.12 MIL/uL — ABNORMAL LOW (ref 4.22–5.81)
RDW: 16.1 % — ABNORMAL HIGH (ref 11.5–15.5)
WBC: 5.2 10*3/uL (ref 4.0–10.5)
nRBC: 0 % (ref 0.0–0.2)

## 2024-06-06 LAB — BASIC METABOLIC PANEL WITH GFR
Anion gap: 9 (ref 5–15)
BUN: 19 mg/dL (ref 8–23)
CO2: 28 mmol/L (ref 22–32)
Calcium: 9.4 mg/dL (ref 8.9–10.3)
Chloride: 102 mmol/L (ref 98–111)
Creatinine, Ser: 1.37 mg/dL — ABNORMAL HIGH (ref 0.61–1.24)
GFR, Estimated: 52 mL/min — ABNORMAL LOW
Glucose, Bld: 117 mg/dL — ABNORMAL HIGH (ref 70–99)
Potassium: 3.9 mmol/L (ref 3.5–5.1)
Sodium: 139 mmol/L (ref 135–145)

## 2024-06-06 LAB — GLUCOSE, CAPILLARY: Glucose-Capillary: 104 mg/dL — ABNORMAL HIGH (ref 70–99)

## 2024-06-06 LAB — MAGNESIUM: Magnesium: 1.7 mg/dL (ref 1.7–2.4)

## 2024-06-06 MED ORDER — AMOXICILLIN-POT CLAVULANATE 875-125 MG PO TABS
1.0000 | ORAL_TABLET | Freq: Two times a day (BID) | ORAL | 0 refills | Status: AC
  Filled 2024-06-06: qty 14, 7d supply, fill #0

## 2024-06-06 MED ORDER — LEVOTHYROXINE SODIUM 50 MCG PO TABS
125.0000 ug | ORAL_TABLET | Freq: Every day | ORAL | Status: DC
  Administered 2024-06-06: 125 ug via ORAL
  Filled 2024-06-06: qty 1

## 2024-06-06 MED ORDER — LEVOFLOXACIN 500 MG PO TABS
500.0000 mg | ORAL_TABLET | Freq: Every day | ORAL | 0 refills | Status: AC
  Filled 2024-06-06: qty 5, 5d supply, fill #0

## 2024-06-06 NOTE — Discharge Summary (Signed)
 " Physician Discharge Summary   Patient: Christopher Pruitt MRN: [id number] DOB: 05/26/1945  Admit date:     06/04/2024  Discharge date: 06/06/24  Discharge Physician: Murvin Mana   PCP: Cleotilde Oneil FALCON, MD   Recommendations at discharge:   Follow-up with PCP in 1 week. Follow-up with [facility] oral surgery on Wednesday.  Wife has contact information, I also listed phone number, she need to call early Wednesday.  Office to closed today.  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 Resolved Problems:   * No resolved hospital problems. *  Hospital Course: Christopher Pruitt is a 79 y.o. year old male with medical history of HTN, HLD c/b CAD s/p CABG, paroxsymal atrial fibrillation, T2DM, CKDIIIa, hypothyroidism, COPD, and prostate cancer s/p treatment in 2014 presenting to the ED with worsening right sided facial swelling.   Patient was found to have a dental abscess, started on antibiotics.  Oral surgery will perform tooth extraction soon. Condition has improved today, medically stable for discharge.  [facility] oral surgery had called patient wife yesterday, he will follow-up on Wednesday for tooth extraction.  Assessment and Plan: Dental abscess. Per recommendation from oral surgery, will continue IV antibiotics for another day. Patient probably can discharge home tomorrow. Called [facility] Dr. Delford office.  The office is closed on Monday and Tuesday due to weather.  Will need to call office on Wednesday to schedule for tooth extraction.  Condition has improved,  facial swelling much better.  Will continue 7-day antibiotics Augmentin .  Based patient response from [facility]as[facility] , Augmentin  still is the best medicine.  Patient has listed side effect from Augmentin  with diarrhea and nausea, this is not an allergy.  Discussed with wife, if patient could not tolerate Augmentin , can come to PCP office to change antibiotics.   Chronic kidney disease stage IIIa. Renal function still stable.   COPD. Condition stable.   Type 2 diabetes. Follow-up with PCP as outpatient.   Anemia with thrombocytopenia. IgG monoclonal gammopathy of undetermined significance. Order patient follow-up with PCP versus oncology          Consultants: None Procedures performed: None  Disposition: Home Diet recommendation:  Discharge Diet Orders (From admission, onward)     Start     Ordered   06/06/24 0000  Diet - low sodium heart healthy        06/06/24 0853           Cardiac diet DISCHARGE MEDICATION: Allergies as of 06/06/2024       Reactions   Amoxicillin -pot Clavulanate Diarrhea, Nausea And Vomiting   Did it involve swelling of the face/tongue/throat, SOB, or low BP? No Did it involve sudden or severe rash/hives, skin peeling, or any reaction on the inside of your mouth or nose? No Did you need to seek medical attention at a hospital or doctor's office? No When did it last happen?      last year or so If all above answers are NO, may proceed with cephalosporin use. Did it involve swelling of the face/tongue/throat, SOB, or low  BP? No  Did it involve sudden or severe rash/hives, skin peeling, or any reaction on the inside of your mouth or nose? No  Did you need to seek medical attention at a hospital or doctor's office? No  When did it last happen?      last year or so  If all above answers are NO, may proceed with cephalosporin use.   Oxycodone Other (See Comments)   Confusion   Clindamycin /lincomycin Itching   Nalfon [fenoprofen Calcium ] Hives   Robaxin  [methocarbamol] Hives        Medication List     STOP taking these medications    albuterol  108 (90 Base) MCG/ACT inhaler Commonly known as: VENTOLIN  HFA   amiodarone 200 MG tablet Commonly known as: PACERONE   furosemide  20 MG tablet Commonly known as: LASIX    ipratropium-albuterol  0.5-2.5 (3) MG/3ML Soln Commonly known as: DUONEB   lisinopril  10 MG tablet Commonly known as: ZESTRIL    methylPREDNISolone  4 MG Tbpk tablet Commonly known as: MEDROL  DOSEPAK   metoprolol  succinate 25 MG 24 hr tablet Commonly known as: TOPROL -XL   midodrine 5 MG tablet Commonly known as: PROAMATINE   oxybutynin  15 MG 24 hr tablet Commonly known as: DITROPAN  XL       TAKE these medications    acetaminophen  650 MG CR tablet Commonly known as: TYLENOL  Take 650 mg by mouth every 8 (eight) hours as needed for pain.   amoxicillin -clavulanate 875-125 MG tablet Commonly known as: AUGMENTIN  Take 1 tablet by mouth 2 (two) times daily for 7 days.   aspirin  EC 81 MG tablet Take 81 mg by mouth daily. Swallow whole.   atorvastatin 40 MG tablet Commonly known as: LIPITOR Take by mouth at bedtime.   colchicine 0.6 MG tablet Take 0.6 mg by mouth daily as needed.   donepezil 5 MG tablet Commonly known as: ARICEPT Take 5 mg by mouth at bedtime.   FLUoxetine  20 MG capsule Commonly known as: PROZAC  Take by mouth.   levothyroxine  125 MCG tablet Commonly known as: SYNTHROID  Take 125 mcg by mouth daily before breakfast.   metoprolol  tartrate 25 MG tablet Commonly known as: LOPRESSOR  Take by mouth. What changed:  how much to take when to take this   OneTouch Ultra Test test strip Generic drug: glucose blood USE 1 EACH (1 STRIP TOTAL) ONCE DAILY USE AS INSTRUCTED.   tamsulosin 0.4 MG Caps capsule Commonly known as: FLOMAX Take 0.4 mg by mouth daily after breakfast.        Follow-up Information     Delford Bare, DDS Follow up in 1 day(s).   Specialties: Dentistry, Oral  Surgery Why: go to office on Tuesday. Call first before going. He has 2 offices, make sure this is the right office Contact information: [address] [place] [address] [phone]         Cleotilde Oneil FALCON, MD Follow up in 1 week(s).   Specialty: Internal Medicine Contact information: [address] [place] [address] [phone]                Discharge Exam: Filed Weights   06/04/24 1019 06/06/24 0446  Weight: 81.4 kg 80.7 kg   General exam: Appears calm and comfortable  Respiratory system: Clear to auscultation. Respiratory effort normal. Cardiovascular system: S1 & S2 heard, RRR. No JVD, murmurs, rubs, gallops or clicks. No pedal edema. Gastrointestinal system: Abdomen is nondistended, soft and nontender. No organomegaly or masses felt. Normal bowel sounds heard.  Central nervous system: Alert and oriented. No focal neurological deficits. Extremities: Symmetric 5 x 5 power. Skin: No rashes, lesions or ulcers Psychiatry: Judgement and insight appear normal. Mood & affect appropriate.  Right facial swelling much improved.  Condition at discharge: good  The results of significant diagnostics from this hospitalization (including imaging, microbiology, ancillary and laboratory) are listed below for reference.   Imaging Studies: CT Maxillofacial W Contrast Result Date: 06/04/2024 EXAM: CT Facial Bones with contrast 06/04/2024 12:04:17 PM TECHNIQUE: CT of the facial bones was performed with the administration of 80 mL of iohexol  (OMNIPAQUE ) 300 MG/ML solution. Multiplanar reformatted images are provided for review. Automated exposure control, iterative reconstruction, and/or weight based adjustment of the mA/kV was utilized to reduce the radiation dose to as low as reasonably achievable. COMPARISON: None available CLINICAL HISTORY: Right facial swelling and edema are likely coming from a right maxillary molar. FINDINGS: AERODIGESTIVE TRACT:  Asymmetric thickening of the right platysma. SALIVARY GLANDS: No acute abnormality. LYMPH NODES: Mildly prominent subcentimeter cervical lymph nodes in the right neck which are likely reactive. SOFT TISSUES: There is an adjacent 0.7 x 0.4 x 1.1 cm enhancing fluid collection compatible with abscess best seen on series 3, image 46 and series 7, image 41. There is significant surrounding soft tissue swelling involving the right buccal space as well as significant stranding in the subcutaneous tissues of the right face. Inflammatory changes in the subcutaneous tissues extend from the inferolateral margin of the right orbit into the right anterior neck. There is associated skin thickening throughout this region. Asymmetric thickening of the right platysma is noted. There are areas of soft tissue swelling along the right lateral and anterior aspects of the mandible and additional ill defined hypoattenuation along the right anterolateral aspect of the maxilla likely reflecting phlegmon. BRAIN, ORBITS AND SINUSES: BRAIN: No acute abnormality. ORBITS: No acute abnormality. SINUSES: Mucosal thickening in the alveolar recesses of the right greater than left maxillary sinuses which are likely odontogenic in origin. No paranasal air fluid levels. BONES: Poor dentition. Lucency involving the right maxillary first molar. Areas of sclerosis along the maxilla and mandible particularly in the regions of the molars likely related to chronic dental disease. Rightward deviation of the nasal septum. No acute fracture. No suspicious bone lesion. VASCULATURE: Atherosclerosis at the carotid bifurcations. IMPRESSION: 1. Right maxillary first molar abscess with associated phlegmon and significant surrounding soft tissue swelling. 2. Facial soft tissue swelling extending from the inferolateral margin of the right orbit into the right anterior neck, concerning for cellulitis. Electronically signed by: Donnice Mania MD 06/04/2024 12:20 PM EST RP  Workstation: [id number]    Microbiology: Results for orders placed or performed during the hospital encounter of 06/04/24  Blood culture (routine x 2)     Status: None (Preliminary result)   Collection Time: 06/04/24 10:54 AM   Specimen: BLOOD  Result Value Ref Range Status   Specimen Description BLOOD BLOOD RIGHT ARM  Final   Special Requests   Final    BOTTLES DRAWN AEROBIC AND ANAEROBIC Blood Culture adequate volume   Culture   Final    NO GROWTH 2 DAYS Performed at [facility], [address] [place], [place] [address]    Report Status PENDING  Incomplete  Blood culture (routine x 2)     Status: None (Preliminary result)   Collection Time: 06/04/24 10:54 AM   Specimen: BLOOD  Result Value Ref Range Status   Specimen Description BLOOD BLOOD RIGHT HAND  Final  Special Requests   Final    BOTTLES DRAWN AEROBIC AND ANAEROBIC Blood Culture adequate volume   Culture   Final    NO GROWTH 2 DAYS Performed at [facility], [address] [place] [place], [place] [address]    Report Status PENDING  Incomplete    Labs: CBC: Recent Labs  Lab 06/04/24 1054 06/05/24 0207 06/06/24 0502  WBC 6.2 6.8 5.2  NEUTROABS 4.6  --   --   HGB 9.7* 9.4* 9.2*  HCT 28.0* 27.4* 26.9*  MCV 85.9 85.6 86.2  PLT 114* 116* 105*   Basic Metabolic Panel: Recent Labs  Lab 06/04/24 1054 06/04/24 1431 06/05/24 0207 06/06/24 0502  NA 139  --  138 139  K 4.3  --  4.0 3.9  CL 102  --  102 102  CO2 26  --  22 28  GLUCOSE 190*  --  147* 117*  BUN 27*  --  22 19  CREATININE 1.54*  --  1.29* 1.37*  CALCIUM  9.7  --  9.4 9.4  MG  --  1.6*  --  1.7   Liver Function Tests: No results for input(s): AST, ALT, ALKPHOS, BILITOT, PROT, ALBUMIN  in the last 168 hours. CBG: Recent Labs  Lab 06/05/24 0759 06/05/24 1215 06/05/24 1607 06/05/24 2238 06/06/24 0757  GLUCAP 128* 146* 114* 100* 104*    Discharge time spent: 35 minutes.  Signed: Murvin Mana, MD [facility] 06/06/2024 "

## 2024-06-09 LAB — CULTURE, BLOOD (ROUTINE X 2)
Culture: NO GROWTH
Culture: NO GROWTH
Special Requests: ADEQUATE
Special Requests: ADEQUATE

## 2024-06-11 ENCOUNTER — Telehealth: Payer: Self-pay | Admitting: Oncology

## 2024-06-11 NOTE — Telephone Encounter (Signed)
 Due to weather, delayed start on 2/3. I spoke with pt spouse and confirmed new appt times

## 2024-06-13 ENCOUNTER — Inpatient Hospital Stay

## 2024-06-13 ENCOUNTER — Inpatient Hospital Stay: Attending: Oncology

## 2024-06-13 VITALS — BP 138/64

## 2024-06-13 DIAGNOSIS — N1832 Chronic kidney disease, stage 3b: Secondary | ICD-10-CM

## 2024-06-13 LAB — HEMOGLOBIN AND HEMATOCRIT, BLOOD
HCT: 27.4 % — ABNORMAL LOW (ref 39.0–52.0)
Hemoglobin: 9.5 g/dL — ABNORMAL LOW (ref 13.0–17.0)

## 2024-06-13 MED ORDER — EPOETIN ALFA-EPBX 40000 UNIT/ML IJ SOLN
40000.0000 [IU] | Freq: Once | INTRAMUSCULAR | Status: AC
  Administered 2024-06-13: 40000 [IU] via SUBCUTANEOUS
  Filled 2024-06-13: qty 1

## 2024-06-15 ENCOUNTER — Ambulatory Visit: Admitting: Dermatology

## 2024-06-15 ENCOUNTER — Encounter: Payer: Self-pay | Admitting: Dermatology

## 2024-06-15 DIAGNOSIS — D692 Other nonthrombocytopenic purpura: Secondary | ICD-10-CM | POA: Diagnosis not present

## 2024-06-15 DIAGNOSIS — D229 Melanocytic nevi, unspecified: Secondary | ICD-10-CM

## 2024-06-15 DIAGNOSIS — L820 Inflamed seborrheic keratosis: Secondary | ICD-10-CM

## 2024-06-15 DIAGNOSIS — L578 Other skin changes due to chronic exposure to nonionizing radiation: Secondary | ICD-10-CM

## 2024-06-15 DIAGNOSIS — L814 Other melanin hyperpigmentation: Secondary | ICD-10-CM | POA: Diagnosis not present

## 2024-06-15 DIAGNOSIS — Z1283 Encounter for screening for malignant neoplasm of skin: Secondary | ICD-10-CM

## 2024-06-15 DIAGNOSIS — Z85828 Personal history of other malignant neoplasm of skin: Secondary | ICD-10-CM

## 2024-06-15 DIAGNOSIS — L905 Scar conditions and fibrosis of skin: Secondary | ICD-10-CM | POA: Diagnosis not present

## 2024-06-15 DIAGNOSIS — Z8589 Personal history of malignant neoplasm of other organs and systems: Secondary | ICD-10-CM

## 2024-06-15 DIAGNOSIS — D1801 Hemangioma of skin and subcutaneous tissue: Secondary | ICD-10-CM | POA: Diagnosis not present

## 2024-06-15 DIAGNOSIS — W908XXA Exposure to other nonionizing radiation, initial encounter: Secondary | ICD-10-CM | POA: Diagnosis not present

## 2024-06-15 DIAGNOSIS — L570 Actinic keratosis: Secondary | ICD-10-CM

## 2024-06-15 DIAGNOSIS — L821 Other seborrheic keratosis: Secondary | ICD-10-CM

## 2024-06-15 DIAGNOSIS — Z7189 Other specified counseling: Secondary | ICD-10-CM

## 2024-06-15 NOTE — Progress Notes (Signed)
 "  Follow-Up Visit   Subjective  Christopher Pruitt is a 79 y.o. male who presents for the following: Skin Cancer Screening and Upper Body Skin Exam. Hx BCC&SCC, Aks, and ISKs.   The patient presents for Upper Body Skin Exam (UBSE) for skin cancer screening and mole check. The patient has spots, moles and lesions to be evaluated, some may be new or changing and the patient may have concern these could be cancer.  The following portions of the chart were reviewed this encounter and updated as appropriate: medications, allergies, medical history  Review of Systems:  No other skin or systemic complaints except as noted in HPI or Assessment and Plan.  Objective  Well appearing patient in no apparent distress; mood and affect are within normal limits.  All skin waist up examined. Relevant physical exam findings are noted in the Assessment and Plan. scalp/face/ears x 9 (9) Pink scaly macules Forehead x1, R temple x1 (2) Stuck on waxy paps with erythema  Assessment & Plan  AK (ACTINIC KERATOSIS) (9) scalp/face/ears x 9 (9) Actinic keratoses are precancerous spots that appear secondary to cumulative UV radiation exposure/sun exposure over time. They are chronic with expected duration over 1 year. A portion of actinic keratoses will progress to squamous cell carcinoma of the skin. It is not possible to reliably predict which spots will progress to skin cancer and so treatment is recommended to prevent development of skin cancer.  Recommend daily broad spectrum sunscreen SPF 30+ to sun-exposed areas, reapply every 2 hours as needed.  Recommend staying in the shade or wearing long sleeves, sun glasses (UVA+UVB protection) and wide brim hats (4-inch brim around the entire circumference of the hat). Call for new or changing lesions. - Destruction of lesion - scalp/face/ears x 9 (9) Complexity: simple   Destruction method: cryotherapy   Informed consent: discussed and consent obtained   Timeout:   patient name, date of birth, surgical site, and procedure verified Lesion destroyed using liquid nitrogen: Yes   Region frozen until ice ball extended beyond lesion: Yes   Outcome: patient tolerated procedure well with no complications   Post-procedure details: wound care instructions given   Additional details:  Prior to procedure, discussed risks of blister formation, small wound, skin dyspigmentation, or rare scar following cryotherapy. Recommend Vaseline ointment to treated areas while healing.   INFLAMED SEBORRHEIC KERATOSIS (2) Forehead x1, R temple x1 (2) Symptomatic, irritating, patient would like treated. - Destruction of lesion - Forehead x1, R temple x1 (2) Complexity: simple   Destruction method: cryotherapy   Informed consent: discussed and consent obtained   Timeout:  patient name, date of birth, surgical site, and procedure verified Lesion destroyed using liquid nitrogen: Yes   Region frozen until ice ball extended beyond lesion: Yes   Outcome: patient tolerated procedure well with no complications   Post-procedure details: wound care instructions given   Additional details:  Prior to procedure, discussed risks of blister formation, small wound, skin dyspigmentation, or rare scar following cryotherapy. Recommend Vaseline ointment to treated areas while healing.    Skin cancer screening performed today.  Actinic Damage - Chronic condition, secondary to cumulative UV/sun exposure - diffuse scaly erythematous macules with underlying dyspigmentation - Recommend daily broad spectrum sunscreen SPF 30+ to sun-exposed areas, reapply every 2 hours as needed.  - Staying in the shade or wearing long sleeves, sun glasses (UVA+UVB protection) and wide brim hats (4-inch brim around the entire circumference of the hat) are also recommended for sun  protection.  - Call for new or changing lesions.  Lentigines, Seborrheic Keratoses, Hemangiomas - Benign normal skin lesions -  Benign-appearing - Call for any changes  Melanocytic Nevi - Tan-brown and/or pink-flesh-colored symmetric macules and papules - Benign appearing on exam today - Observation - Call clinic for new or changing moles - Recommend daily use of broad spectrum spf 30+ sunscreen to sun-exposed areas.   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  - Call if any new or changing lesions are noted between office visits  HISTORY OF BASAL CELL CARCINOMA OF THE SKIN Right postauricular neck- [facility] 02/29/2024, clear today.  - No evidence of recurrence today - Recommend regular full body skin exams - Recommend daily broad spectrum sunscreen SPF 30+ to sun-exposed areas, reapply every 2 hours as needed.  - Call if any new or changing lesions are noted between office visits  SCAR- secondary to triple bypass about a year ago  Exam: Dyspigmented smooth macule or patch at chest.  Benign-appearing.  Observation.  Call clinic for new or changing lesions. Recommend daily broad spectrum sunscreen SPF 30+, reapply every 2 hours as needed. Treatment: Recommend Serica moisturizing scar formula cream every night or [employer] brand or Mederma silicone scar sheet every night for the first year after a scar appears to help with scar remodeling if desired. Scars remodel on their own for a full year and will gradually improve in appearance over time.  Purpura - Chronic; persistent and recurrent.  Treatable, but not curable. - Violaceous macules and patches at arms  - Benign - Related to trauma, age, sun damage and/or use of blood thinners, chronic use of topical and/or oral steroids - Observe - Can use OTC arnica containing moisturizer such as Dermend Bruise Formula if desired -  Call for worsening or other concerns  Return in about 4 months (around 10/13/2024) for ISK & AK follow-up, recheck scalp & face, w/ Dr. Hester.  Christopher Pruitt, CMA, am acting as scribe for Alm Hester, MD.  Documentation: I have reviewed the above documentation for accuracy and completeness, and I agree with the above.  Alm Hester, MD  "

## 2024-06-15 NOTE — Patient Instructions (Addendum)

## 2024-07-25 ENCOUNTER — Inpatient Hospital Stay

## 2024-09-05 ENCOUNTER — Inpatient Hospital Stay

## 2024-09-13 ENCOUNTER — Inpatient Hospital Stay: Admitting: Oncology

## 2024-09-13 ENCOUNTER — Inpatient Hospital Stay

## 2024-10-18 ENCOUNTER — Ambulatory Visit: Admitting: Dermatology
